# Patient Record
Sex: Female | Born: 1935 | Race: Black or African American | Hispanic: No | Smoking: Former smoker
Health system: Southern US, Community
[De-identification: ages and names within clinical notes are randomized; demographics above are authoritative.]

## PROBLEM LIST (undated history)

## (undated) DIAGNOSIS — M810 Age-related osteoporosis without current pathological fracture: Secondary | ICD-10-CM

## (undated) DIAGNOSIS — H409 Unspecified glaucoma: Secondary | ICD-10-CM

## (undated) DIAGNOSIS — Z950 Presence of cardiac pacemaker: Secondary | ICD-10-CM

## (undated) DIAGNOSIS — C801 Malignant (primary) neoplasm, unspecified: Secondary | ICD-10-CM

## (undated) DIAGNOSIS — E119 Type 2 diabetes mellitus without complications: Secondary | ICD-10-CM

## (undated) DIAGNOSIS — C50919 Malignant neoplasm of unspecified site of unspecified female breast: Secondary | ICD-10-CM

## (undated) DIAGNOSIS — I639 Cerebral infarction, unspecified: Secondary | ICD-10-CM

## (undated) DIAGNOSIS — I10 Essential (primary) hypertension: Secondary | ICD-10-CM

## (undated) DIAGNOSIS — E785 Hyperlipidemia, unspecified: Secondary | ICD-10-CM

## (undated) DIAGNOSIS — Z923 Personal history of irradiation: Secondary | ICD-10-CM

## (undated) HISTORY — PX: OTHER SURGICAL HISTORY: SHX169

## (undated) HISTORY — DX: Malignant (primary) neoplasm, unspecified: C80.1

## (undated) HISTORY — DX: Essential (primary) hypertension: I10

## (undated) HISTORY — DX: Unspecified glaucoma: H40.9

## (undated) HISTORY — DX: Presence of cardiac pacemaker: Z95.0

## (undated) HISTORY — DX: Type 2 diabetes mellitus without complications: E11.9

## (undated) HISTORY — DX: Age-related osteoporosis without current pathological fracture: M81.0

## (undated) HISTORY — DX: Hyperlipidemia, unspecified: E78.5

---

## 1998-12-25 ENCOUNTER — Other Ambulatory Visit: Admission: RE | Admit: 1998-12-25 | Discharge: 1998-12-25 | Payer: Self-pay | Admitting: Family Medicine

## 2001-03-11 ENCOUNTER — Encounter (INDEPENDENT_AMBULATORY_CARE_PROVIDER_SITE_OTHER): Payer: Self-pay | Admitting: *Deleted

## 2001-03-11 ENCOUNTER — Encounter: Payer: Self-pay | Admitting: General Surgery

## 2001-03-11 ENCOUNTER — Encounter: Admission: RE | Admit: 2001-03-11 | Discharge: 2001-03-11 | Payer: Self-pay | Admitting: General Surgery

## 2001-03-11 HISTORY — PX: BREAST BIOPSY: SHX20

## 2001-03-12 ENCOUNTER — Other Ambulatory Visit: Admission: RE | Admit: 2001-03-12 | Discharge: 2001-03-12 | Payer: Self-pay | Admitting: Radiology

## 2001-03-25 ENCOUNTER — Encounter: Admission: RE | Admit: 2001-03-25 | Discharge: 2001-03-25 | Payer: Self-pay | Admitting: General Surgery

## 2001-03-25 ENCOUNTER — Encounter: Payer: Self-pay | Admitting: General Surgery

## 2001-03-25 ENCOUNTER — Encounter (INDEPENDENT_AMBULATORY_CARE_PROVIDER_SITE_OTHER): Payer: Self-pay | Admitting: *Deleted

## 2001-03-25 ENCOUNTER — Ambulatory Visit (HOSPITAL_BASED_OUTPATIENT_CLINIC_OR_DEPARTMENT_OTHER): Admission: RE | Admit: 2001-03-25 | Discharge: 2001-03-25 | Payer: Self-pay | Admitting: General Surgery

## 2001-03-25 HISTORY — PX: BREAST LUMPECTOMY: SHX2

## 2001-04-13 ENCOUNTER — Ambulatory Visit: Admission: RE | Admit: 2001-04-13 | Discharge: 2001-07-12 | Payer: Self-pay | Admitting: Radiation Oncology

## 2001-04-24 DIAGNOSIS — Z923 Personal history of irradiation: Secondary | ICD-10-CM

## 2001-04-24 HISTORY — DX: Personal history of irradiation: Z92.3

## 2001-12-09 ENCOUNTER — Encounter: Admission: RE | Admit: 2001-12-09 | Discharge: 2001-12-09 | Payer: Self-pay | Admitting: General Surgery

## 2001-12-09 ENCOUNTER — Encounter: Payer: Self-pay | Admitting: General Surgery

## 2002-01-29 ENCOUNTER — Emergency Department (HOSPITAL_COMMUNITY): Admission: EM | Admit: 2002-01-29 | Discharge: 2002-01-29 | Payer: Self-pay | Admitting: *Deleted

## 2002-01-29 ENCOUNTER — Encounter: Payer: Self-pay | Admitting: *Deleted

## 2002-03-14 ENCOUNTER — Other Ambulatory Visit: Admission: RE | Admit: 2002-03-14 | Discharge: 2002-03-14 | Payer: Self-pay | Admitting: Family Medicine

## 2002-12-22 ENCOUNTER — Encounter: Payer: Self-pay | Admitting: General Surgery

## 2002-12-22 ENCOUNTER — Encounter: Admission: RE | Admit: 2002-12-22 | Discharge: 2002-12-22 | Payer: Self-pay | Admitting: General Surgery

## 2003-12-28 ENCOUNTER — Encounter: Admission: RE | Admit: 2003-12-28 | Discharge: 2003-12-28 | Payer: Self-pay | Admitting: General Surgery

## 2004-09-15 ENCOUNTER — Other Ambulatory Visit: Admission: RE | Admit: 2004-09-15 | Discharge: 2004-09-15 | Payer: Self-pay | Admitting: Family Medicine

## 2004-09-18 ENCOUNTER — Ambulatory Visit: Payer: Self-pay | Admitting: Cardiology

## 2004-09-18 ENCOUNTER — Inpatient Hospital Stay (HOSPITAL_COMMUNITY): Admission: AD | Admit: 2004-09-18 | Discharge: 2004-09-22 | Payer: Self-pay | Admitting: Cardiology

## 2004-09-19 ENCOUNTER — Encounter: Payer: Self-pay | Admitting: Cardiology

## 2004-09-24 ENCOUNTER — Ambulatory Visit: Payer: Self-pay | Admitting: Cardiology

## 2005-01-19 ENCOUNTER — Ambulatory Visit: Payer: Self-pay | Admitting: Cardiology

## 2005-01-28 ENCOUNTER — Ambulatory Visit: Payer: Self-pay | Admitting: Cardiology

## 2005-02-06 ENCOUNTER — Encounter: Admission: RE | Admit: 2005-02-06 | Discharge: 2005-02-06 | Payer: Self-pay | Admitting: General Surgery

## 2005-09-24 ENCOUNTER — Ambulatory Visit: Payer: Self-pay | Admitting: Pulmonary Disease

## 2005-11-16 ENCOUNTER — Ambulatory Visit: Payer: Self-pay | Admitting: Pulmonary Disease

## 2006-05-26 ENCOUNTER — Ambulatory Visit (HOSPITAL_COMMUNITY): Admission: RE | Admit: 2006-05-26 | Discharge: 2006-05-26 | Payer: Self-pay | Admitting: General Surgery

## 2007-02-09 ENCOUNTER — Ambulatory Visit: Payer: Self-pay | Admitting: Cardiology

## 2007-04-08 HISTORY — PX: PACEMAKER INSERTION: SHX728

## 2007-06-02 ENCOUNTER — Encounter: Admission: RE | Admit: 2007-06-02 | Discharge: 2007-06-02 | Payer: Self-pay | Admitting: Surgery

## 2008-07-31 ENCOUNTER — Encounter: Admission: RE | Admit: 2008-07-31 | Discharge: 2008-07-31 | Payer: Self-pay | Admitting: Surgery

## 2010-02-27 ENCOUNTER — Ambulatory Visit (HOSPITAL_COMMUNITY): Admission: RE | Admit: 2010-02-27 | Discharge: 2010-02-27 | Payer: Self-pay | Admitting: Ophthalmology

## 2010-04-24 ENCOUNTER — Encounter: Admission: RE | Admit: 2010-04-24 | Discharge: 2010-04-24 | Payer: Self-pay | Admitting: Family Medicine

## 2010-06-15 ENCOUNTER — Encounter: Payer: Self-pay | Admitting: Surgery

## 2010-06-15 ENCOUNTER — Encounter: Payer: Self-pay | Admitting: General Surgery

## 2010-08-07 LAB — HEMOGLOBIN AND HEMATOCRIT, BLOOD: HCT: 36 % (ref 36.0–46.0)

## 2010-08-07 LAB — BASIC METABOLIC PANEL
CO2: 28 mEq/L (ref 19–32)
Calcium: 8.8 mg/dL (ref 8.4–10.5)
Creatinine, Ser: 0.77 mg/dL (ref 0.4–1.2)
GFR calc Af Amer: >60 mL/min (ref >60)
GFR calc non Af Amer: >60 mL/min (ref >60)

## 2010-10-10 NOTE — Cardiovascular Report (Signed)
NAMECHRISTA, Henson NO.:  [id number]   MEDICAL RECORD NO.:  [id number]          PATIENT TYPE:  INP   LOCATION:  2036                         FACILITY:  [facility]   PHYSICIAN:  Arturo Morton. Riley Kill, M.D. [facility] OF BIRTH:  1923/05/01   DATE OF PROCEDURE:  09/19/2004  DATE OF DISCHARGE:                              CARDIAC CATHETERIZATION   INDICATIONS:  Kathryn Henson is a 77 year old woman who presents with a markedly  abnormal EKG which is changed from previous studies.  There are deep T-wave  inversions noted in the inferior and anterolateral leads.  This is changed  from a previous electrocardiogram.  She was seen by Dr. Diona Browner and  subsequently referred for catheterization.  Her chief symptom complex was  progressive shortness of breath.  Left heart catheterization was recommended  by Dr. Diona Browner, and the patient set up for this procedure.   PROCEDURES:  1.  Left heart catheterization.  2.  Selective coronary arteriography.  3.  Selective left ventriculography.   DESCRIPTION OF PROCEDURE:  The patient was brought to the catheterization  laboratory and prepped and draped in the usual fashion.  Through an anterior  puncture, the right femoral artery was easily entered, a 6 French sheath was  placed.  Views of the left and right coronary arteries were obtained in  multiple angiographic projections.  Central aortic and left ventricular  pressures were measured with pigtail.  Ventriculography was performed in the  RAO projection.  Overall, the patient tolerated the procedure well.  All  catheters were subsequently removed and she was removed to the holding area  for direct manual compression.  I then spoke with Dr. Diona Browner by phone.  The patient's  husband was not here at the time.   HEMODYNAMIC DATA:  1.  Central aortic pressure 143/77, mean 102.  2.  Left ventricular pressure 141/16.  3.  No gradient on pullback across the aortic valve.   ANGIOGRAPHIC DATA:  1.  Ventriculography was performed in the RAO projection.  LV function was      vigorous.  There was trace mitral regurgitation.  Ejection fraction was      calculated at 63%.  No definite wall motion abnormalities were seen.      There was a suggestion of a left ventricular hypertrophy.  2.  There was mild scattered calcification of the coronary arteries.  3.  The left main coronary is large and free of critical disease.  4.  The left anterior descending artery courses to the apex.  There is      fairly marked tortuosity throughout.  There is 20% segmental plaquing in      the proximal vessel.  There is probably 30% narrowing in the mid vessel.      The distal vessel wraps the apex and is markedly tortuous.  There is a      small caliber diagonal branch which has some diffuse luminal      irregularity, but not critical stenoses.  5.  There is 20% proximal narrowing in the circumflex and some      calcification.  There  is so much tortuosity in the circumflex system      that it would be unlikely that this could be approached percutaneously.      There is a first marginal branch which bifurcates and becomes a small      caliber vessel distally.  The AV circumflex provides an additional two      marginal branches.  They are free of critical disease.  6.  The right coronary artery has about 20% proximal narrowing and then      supplies some fairly small posterior descending and an acute marginal      branch; both of which are free of critical disease.   CONCLUSIONS:  1.  Well-preserved overall left ventricular function.  2.  No critical narrowing of the coronary arteries.  3.  Markedly abnormal electrocardiogram of uncertain etiology.   PLAN:  1.  We will check a D-dimer.  2.  We will add nitrates and beta blockade to the current regimen.  3.  We will consider a CT scan and MRI.  4.  She will ambulate over the weekend and we will watch her EKG during that      period of time to see if  there is some latent improvement.   DISCUSSION:  The ECG at parts suggests a Takotsubo type of presentation.  However, her symptoms have been gradual over time.  The current study does  not demonstrate a clear-cut entity.  We will further evaluate her carefully.      TDS/MEDQ  D:  09/19/2004  T:  09/19/2004  Job:  [id number]   cc:   Ernestina Penna, M.D.  [address] [place]  [place] [address]  Fax: [phone]

## 2010-10-10 NOTE — Op Note (Signed)
[place]  Patient:    Kathryn Henson, Kathryn Henson Visit Number: [id number] MRN: [id number]          Service Type: DSU Location: [facility] Attending Physician:  Janalyn Rouse Dictated by:   Rose Phi. Maple Hudson, M.D. Proc. Date: 03/25/01 Admit Date:  03/25/2001 Discharge Date: 03/25/2001                             Operative Report  PREOPERATIVE DIAGNOSIS:  Carcinoma of the left breast.  POSTOPERATIVE DIAGNOSIS:  Carcinoma of the left breast.  OPERATION PERFORMED: 1. Blue dye injection. 2. Left partial mastectomy. 3. Left axillary sentinel lymph node biopsy.  SURGEON:  Rose Phi. Maple Hudson, M.D.  ANESTHESIA:  General.  DESCRIPTION OF PROCEDURE:  Prior to coming to the operating room the patient had two wires placed to identify both lesions that were in the upper outer quadrant of her left breast.  She also had 1 mC1 of technetium sulfur colloid injected intradermally around the areolar tissue.  After she was put to sleep, I injected 5 cc of Lymphazurin blue in the subareolar tissue.  After suitable general anesthesia was induced, the patient was placed in the supine position with the left arm extended on the arm board and the left breast and chest wall prepped and draped in the usual fashion.  With the two wires placed for the two lesions that were in the upper outer quadrant, I centered a curved incision between the wires and designed where the Xs marked the actual locations of the lesion.  The incision was then made and we delivered both wires into the incision and then excised the tissue surrounding the wires.  Specimen mammography confirmed the removal of the lesions and they were then submitted to the pathologist.  While that was being done, a short transverse left axillary incision was made with dissection through the subcutaneous tissues and a self-retaining retractor inserted.  Just deep to the clavipectoral fascia was one mildly blue and hot  lymph node and another blue and hot lymph node, both of which were removed as sentinel nodes and submitted to the pathologist.  The two nodes turned out to be negative.  The incision was closed with 3-0 Vicryl and subcuticular 4-0 Monocryl and Steri-Strips.  As far as the partial mastectomy was concerned, I had taken about as much tissue as I could without just completely compromising the cosmetic effect and so I awaited the margins on permanents. That wound was closed with 5-0 Monocryl and Steri-Strips also.  Dressing was then applied.  The patient was transferred to the recovery room in satisfactory condition having tolerated the procedure well. Dictated by:   Rose Phi. Maple Hudson, M.D. Attending Physician:  Janalyn Rouse DD:  03/25/01 TD:  03/28/01 Job: [id number]

## 2010-10-10 NOTE — Discharge Summary (Signed)
Kathryn Henson, BRICKLEY NO.:  [id number]   MEDICAL RECORD NO.:  [id number]          PATIENT TYPE:  INP   LOCATION:  2036                         FACILITY:  [facility]   PHYSICIAN:  Jonelle Sidle, M.D. LHCDATE OF BIRTH:  1922/12/11   DATE OF ADMISSION:  09/18/2004  DATE OF DISCHARGE:  09/22/2004                                 DISCHARGE SUMMARY   PROCEDURE:  1.  Cardiac catheterization September 18, 2004.  2.  CT angiogram September 20, 2004.  3.  Head/neck MRI/MRA September 20, 2004.   REASON FOR ADMISSION:  [name] is a 77 year old female with no prior  history of coronary artery disease who presented to Dr. Simona Huh in the  office for evaluation of recent progressive dyspnea and an abnormal resting  electrocardiogram.   LABORATORY DATA:  Cardiac enzymes normal.  BNP 151. Lipid profile with total  cholesterol 166, triglyceride 151, HDL 49, LDL 87, TSH 2.2.  Hemoglobin  11.5, hematocrit 34, MCV 92, platelets 168, WBC 4.4 on admission.  D-dimer  0.87.  Normal electrolytes, renal function, and liver enzymes.   HOSPITAL COURSE:  Following direct admission by Dr. Simona Huh from his  office, the patient was placed on a medication regimen including aspirin and  Lovenox.  Serial markers were obtained and were all within normal limits.  Initial echocardiogram revealed normal LVF (EF 55-65%) with mild aortic  regurgitation/mitral regurgitation and moderate tricuspid regurgitation.  The patient was referred for cardiac catheterization performed by Dr. Bonnee Quin (see report for full details) revealing nonobstructive coronary  artery disease with vigorous left ventricular  function (EF 50-60%) no wall  motion abnormalities, and trace mitral regurgitation.  Dr. Riley Kill then  ordered a D-dimer which was elevated (0.87) however, follow up CT scan of  the chest was negative for pulmonary emboli.  There was, however, a 4 mm LUL  nodule, most likely representing a noncalcified  granuloma.  This will need  to be followed up with CT scan in three months.  Dr. Andee Lineman then wondered  whether or not the abnormal resting electrocardiogram with marked symmetric  T-wave inversion could represent a CNS event.  The patient was referred for  an MRI of the head/neck to rule out a CNS event.  This revealed no  circulatory abnormalities with evidence of a 2-3 mm right (PCA) aneurysm of  fetal-type etiology.   On the day of discharge, the patient underwent repeat 2D echocardiogram,  this time with Valsalva maneuver (results pending at discharge).  The  patient was subsequently cleared by Dr. Bonnee Quin with recommendation for  early follow up with Dr. Simona Huh in [place].  He also noted the patient  will need a follow up pulmonary evaluation as well as repeat chest CT scan  as recommended.  He recommended consideration for further evaluation of the  noted posterior cerebral artery aneurysm.   DISCHARGE MEDICATIONS:  Metoprolol 25 mg b.i.d., (new), Imdur 15 mg daily  (new), Amaryl 2 mg daily, aspirin 81 mg daily, Zocor 10 mg daily, Mavik 1 mg  daily.   DISCHARGE INSTRUCTIONS:  1.  Discontinue Inderal.  2.  No heavy lifting/driving.  Arrangements have been made for the patient to follow up with Dr. Simona Huh on Wednesday, May 3, at 1:15 p.m., at our eating clinic.   DISCHARGE DIAGNOSIS:  1.  Dyspnea/abnormal electrocardiogram.      1.  Undetermined etiology.      2.  Nonobstructive coronary artery disease/normal left ventricular          function/cardiac catheterization September 18, 2004.      3.  Normal cardiac markers.  2.  Interstitial lung disease.      1.  Left upper lobe nodule.  3.  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  4.  Hypertension.  5.  Diabetes mellitus.  6.  Dyslipidemia.      GS/MEDQ  D:  09/22/2004  T:  09/22/2004  Job:  [id number]   cc:   Magnus Sinning. Dimple Casey, M.D.  [address] [place]  [place] [address]  Fax: [phone]

## 2010-10-10 NOTE — H&P (Signed)
[name], [name] NO.:  [id number]   MEDICAL RECORD NO.:  [id number]          PATIENT TYPE:  INP   LOCATION:  2036                         FACILITY:  [facility]   PHYSICIAN:  Jonelle Sidle, M.D. LHCDATE OF BIRTH:  Sep 04, 1922   DATE OF ADMISSION:  09/18/2004  DATE OF DISCHARGE:                                HISTORY & PHYSICAL   PRIMARY CARE PHYSICIAN:  Dr. Montey Hora.   CARDIOLOGIST:  Nona Dell, M.D.   REASON FOR ADMISSION:  Progressive shortness of breath and abnormal resting  electrocardiogram.   HISTORY OF PRESENT ILLNESS:  Kathryn Henson is a very pleasant 77 year old woman  with a 10-year history of both types of  diabetes mellitus and dyslipidemia  as well as hypertension diagnosed approximately one year ago.  She has no  previously documented history of cardiovascular disease.  She recently saw  Dr. Dimple Casey for routine visit and mentioned that she had been having slowly  progressive shortness of breath with activity.  She had an electrocardiogram  obtained in the office which was significantly abnormal, showing deep T-wave  inversions in the lateral leads, with moderate T-wave inversions in the  inferior leads.  This was new compared to a previous tracing from November  2004.  She was subsequently referred to see [place] and discuss things further.   [name] denies having any significant chest discomfort and stated that  largely she has been stable although has noted over the last 1-2 years she  has been progressively more short of breath with activity, as well as more  fatigued.  She can walk approximately 1/4 of a mile, although has to stop  due to fatigue and shortness of breath.  She has noted no prolonged or  bothersome palpitations, although does experience a rapid heart beat for a  few seconds approximately once a  month.  She apparently has a prior history  of  irregular heart beat and has been on Inderal long term for this.  She  denies  any focal neurological symptoms or changes in mental status.  She has  been compliant with her medications and otherwise states that she has been  in her usual state of health.  Repeat electrocardiogram today shows marked T-  wave abnormalities with deep T-wave inversions in the inferior and  anterolateral leads with 0.5 mm ST segment depression also noted.  This  looks to be somewhat progressed compared to tracing from the 24th of April.  Presently in the office she is denying any active shortness of breath or  chest pain at rest and looks comfortable.   ALLERGIES:  No known drug allergies.   CURRENT MEDICATIONS:  1.  Amaryl 2 mg p.o. daily.  2.  Enteric-coated aspirin 81 mg p.o. daily.  3.  Ibuprofen 200 mg p.o. daily p.r.n.  4.  Inderal 10 mg p.o. daily.  5.  Mavik 1 mg p.o. daily.  6.  Zocor 10 mg p.o. daily.   PAST MEDICAL HISTORY:  1.  Hypertension.  2.  Type 2 diabetes mellitus.  3.  Dyslipidemia.  4.  History of arthritis.  5.  Status post left breast lumpectomy in 2002.   FAMILY HISTORY:  Significant for heart problems in the patient's mother  who died at age 59.  Has a history of stroke in the patient's father who  died at age 37.  She also had 4 sisters ages 107, 86, 77 and 17 who died with  cancer, stroke, heart disease and old age respectively.   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  There is no significant tobacco or alcohol use history.   REVIEW OF SYSTEMS:  As per history of present illness.  She has occasional  problems with seasonal allergies.  She is bothered by arthritic pain  occasionally.  She has otherwise had no major complaints and systems are  otherwise negative.   PHYSICAL EXAMINATION:  Blood pressure is 152/86, heart rate is 76, weighs  145 pounds.  GENERAL:  In general, this is a well-nourished woman, seated, in no  acute  distress, wearing glasses.  HEENT:  Conjunctivae normal, pharynx is clear, neck is supple without  elevated jugular venous pressure or loud carotid bruits.  No thyromegaly is  noted.  LUNGS:  Clear with unlabored breathing at rest.  CARDIAC EXAM:  Reveals a regular rate and rhythm with a 2/6 systolic murmur  heard best at the base without radiation to the carotid.  Second heart sound  is preserved.  No pericardial rub or S3 gallop is noted.  PMI is indistinct.  ABDOMEN:  Soft and nontender with no hepatomegaly or rebound.  Bowel sounds  normal.  EXTREMITIES:  No pitting edema.  Peripheral pulses are 2+.  SKIN:  No ulcerative changes are noted.  MUSCULOSKELETAL:  No kyphosis is noted.  NEUROPSYCHIATRIC:  The patient is alert and oriented x3.  Affect is normal.   IMPRESSION AND RECOMMENDATIONS:  1.  Progressive shortness of breath and fatigue with activity, with      significantly abnormal resting electrocardiogram showing marked T-wave      abnormalities fairly diffusely.  This almost has the pattern of a CNS      event although nothing is apparent based on review of systems or      symptoms at this time.  Obviously concerns would be ischemic heart      disease and/or subendocardial event.  At this point we will admit Ms.      Dow to the hospital, obtain baseline blood work, chest x-ray and      anticipate diagnostic coronary angiography and to clearly assess      coronary anatomy.  We will also plan a 2D echocardiogram to follow up on      cardiac murmur, structure and function.  2.  Plan to continue present medications with the addition of Lovenox.  We      will also follow up with fasting profile and check cardiac markers.  3.  Further plans to follow.  Risks and benefits were discussed with the      patient and she agrees with plan as outlined.      SGM/MEDQ  D:  09/18/2004  T:  09/18/2004  Job:  [id number]

## 2011-04-24 ENCOUNTER — Other Ambulatory Visit: Payer: Self-pay | Admitting: Family Medicine

## 2011-04-24 DIAGNOSIS — Z1231 Encounter for screening mammogram for malignant neoplasm of breast: Secondary | ICD-10-CM

## 2011-05-20 ENCOUNTER — Ambulatory Visit
Admission: RE | Admit: 2011-05-20 | Discharge: 2011-05-20 | Disposition: A | Discharge status: Home / Self Care | Payer: Medicare Other | Source: Ambulatory Visit | Attending: Family Medicine | Admitting: Family Medicine

## 2011-05-20 DIAGNOSIS — Z1231 Encounter for screening mammogram for malignant neoplasm of breast: Secondary | ICD-10-CM

## 2011-05-21 ENCOUNTER — Other Ambulatory Visit: Payer: Self-pay | Admitting: Obstetrics & Gynecology

## 2011-05-21 ENCOUNTER — Other Ambulatory Visit: Payer: Self-pay | Admitting: Family Medicine

## 2011-05-21 DIAGNOSIS — N63 Unspecified lump in unspecified breast: Secondary | ICD-10-CM

## 2011-06-11 ENCOUNTER — Ambulatory Visit
Admission: RE | Admit: 2011-06-11 | Discharge: 2011-06-11 | Disposition: A | Discharge status: Home / Self Care | Payer: Medicare Other | Source: Ambulatory Visit | Attending: Family Medicine | Admitting: Family Medicine

## 2011-06-11 ENCOUNTER — Other Ambulatory Visit: Payer: Self-pay | Admitting: Family Medicine

## 2011-06-11 DIAGNOSIS — N63 Unspecified lump in unspecified breast: Secondary | ICD-10-CM

## 2011-11-17 DIAGNOSIS — I5181 Takotsubo syndrome: Secondary | ICD-10-CM | POA: Insufficient documentation

## 2012-05-13 ENCOUNTER — Other Ambulatory Visit: Payer: Self-pay | Admitting: Family Medicine

## 2012-05-13 DIAGNOSIS — Z853 Personal history of malignant neoplasm of breast: Secondary | ICD-10-CM

## 2012-05-13 DIAGNOSIS — Z1231 Encounter for screening mammogram for malignant neoplasm of breast: Secondary | ICD-10-CM

## 2012-05-13 DIAGNOSIS — Z9889 Other specified postprocedural states: Secondary | ICD-10-CM

## 2012-06-20 ENCOUNTER — Ambulatory Visit
Admission: RE | Admit: 2012-06-20 | Discharge: 2012-06-20 | Disposition: A | Discharge status: Home / Self Care | Payer: Medicare Other | Source: Ambulatory Visit | Attending: Family Medicine | Admitting: Family Medicine

## 2012-06-20 DIAGNOSIS — Z1231 Encounter for screening mammogram for malignant neoplasm of breast: Secondary | ICD-10-CM

## 2012-06-20 DIAGNOSIS — Z853 Personal history of malignant neoplasm of breast: Secondary | ICD-10-CM

## 2012-06-20 DIAGNOSIS — Z9889 Other specified postprocedural states: Secondary | ICD-10-CM

## 2012-09-10 DIAGNOSIS — R55 Syncope and collapse: Secondary | ICD-10-CM | POA: Insufficient documentation

## 2012-09-20 ENCOUNTER — Ambulatory Visit (INDEPENDENT_AMBULATORY_CARE_PROVIDER_SITE_OTHER): Payer: Medicare Other | Admitting: General Practice

## 2012-09-20 ENCOUNTER — Encounter: Payer: Self-pay | Admitting: General Practice

## 2012-09-20 VITALS — BP 139/82 | HR 77 | Temp 97.2°F | Ht 60.0 in | Wt 127.0 lb

## 2012-09-20 DIAGNOSIS — R55 Syncope and collapse: Secondary | ICD-10-CM

## 2012-09-20 DIAGNOSIS — Z09 Encounter for follow-up examination after completed treatment for conditions other than malignant neoplasm: Secondary | ICD-10-CM

## 2012-09-20 NOTE — Progress Notes (Signed)
  Subjective:    Patient ID: Kathryn Henson, female    DOB: [birth year], 79 y.o.   MRN: [id number]  HPI Patient presents today for follow up of chronic health conditions and recent hospitalization. Hospitalized overnight for syncope episode. Reports no further episodes of dizziness or syncope.  Hypertension- checks blood pressure at home occasionally (120's/ 70-80's). Hyperglycemia- blood sugar checked daily ( 120's normally). Denies hypoglycemia episodes. Reports eating a healthy diet and exercise regularly. Denies muscle cramps in legs.     Review of Systems  Constitutional: Negative for activity change and appetite change.  HENT: Negative for neck pain.   Respiratory: Negative for chest tightness and shortness of breath.   Cardiovascular: Negative for chest pain and palpitations.  Gastrointestinal: Negative for abdominal pain and abdominal distention.  Genitourinary: Negative for difficulty urinating and dyspareunia.  Musculoskeletal: Negative for back pain.  Skin: Negative.   Neurological: Negative for dizziness, numbness and headaches.  Psychiatric/Behavioral: Negative.        Objective:   Physical Exam  Constitutional: She is oriented to person, place, and time. She appears well-developed and well-nourished.  HENT:  Head: Normocephalic and atraumatic.  Cardiovascular: Normal rate, regular rhythm and normal heart sounds.   Pulmonary/Chest: Effort normal. No respiratory distress. She exhibits no tenderness.  Abdominal: Soft. She exhibits no distension and no mass. There is no tenderness. There is no rebound and no guarding.  Musculoskeletal: Normal range of motion.  Positive monofilament test times 4 areas of bilateral feet  Neurological: She is alert and oriented to person, place, and time. No cranial nerve deficit.  Skin: Skin is warm and dry.  Psychiatric: She has a normal mood and affect.          Assessment & Plan:  Continue current medications Continue current  exercise routine and healthy diet RTO if symptoms develop Patient verbalized understanding Raymon Mutton, FNP-C

## 2012-09-20 NOTE — Patient Instructions (Signed)
Hypertension As your heart beats, it forces blood through your arteries. This force is your blood pressure. If the pressure is too high, it is called hypertension (HTN) or high blood pressure. HTN is dangerous because you may have it and not know it. High blood pressure may mean that your heart has to work harder to pump blood. Your arteries may be narrow or stiff. The extra work puts you at risk for heart disease, stroke, and other problems.  Blood pressure consists of two numbers, a higher number over a lower, 110/72, for example. It is stated as "110 over 72." The ideal is below 120 for the top number (systolic) and under 80 for the bottom (diastolic). Write down your blood pressure today. You should pay close attention to your blood pressure if you have certain conditions such as:  Heart failure.  Prior heart attack.  Diabetes  Chronic kidney disease.  Prior stroke.  Multiple risk factors for heart disease. To see if you have HTN, your blood pressure should be measured while you are seated with your arm held at the level of the heart. It should be measured at least twice. A one-time elevated blood pressure reading (especially in the Emergency Department) does not mean that you need treatment. There may be conditions in which the blood pressure is different between your right and left arms. It is important to see your caregiver soon for a recheck. Most people have essential hypertension which means that there is not a specific cause. This type of high blood pressure may be lowered by changing lifestyle factors such as:  Stress.  Smoking.  Lack of exercise.  Excessive weight.  Drug/tobacco/alcohol use.  Eating less salt. Most people do not have symptoms from high blood pressure until it has caused damage to the body. Effective treatment can often prevent, delay or reduce that damage. TREATMENT  When a cause has been identified, treatment for high blood pressure is directed at the  cause. There are a large number of medications to treat HTN. These fall into several categories, and your caregiver will help you select the medicines that are best for you. Medications may have side effects. You should review side effects with your caregiver. If your blood pressure stays high after you have made lifestyle changes or started on medicines,   Your medication(s) may need to be changed.  Other problems may need to be addressed.  Be certain you understand your prescriptions, and know how and when to take your medicine.  Be sure to follow up with your caregiver within the time frame advised (usually within two weeks) to have your blood pressure rechecked and to review your medications.  If you are taking more than one medicine to lower your blood pressure, make sure you know how and at what times they should be taken. Taking two medicines at the same time can result in blood pressure that is too low. SEEK IMMEDIATE MEDICAL CARE IF:  You develop a severe headache, blurred or changing vision, or confusion.  You have unusual weakness or numbness, or a faint feeling.  You have severe chest or abdominal pain, vomiting, or breathing problems. MAKE SURE YOU:   Understand these instructions.  Will watch your condition.  Will get help right away if you are not doing well or get worse. Document Released: 05/11/2005 Document Revised: 08/03/2011 Document Reviewed: 12/30/2007 [employer] Patient Information 2013 [employer].  Hypertriglyceridemia  Diet for High blood levels of Triglycerides Most fats in food are triglycerides.   Triglycerides in your blood are stored as fat in your body. High levels of triglycerides in your blood may put you at a greater risk for heart disease and stroke.  Normal triglyceride levels are less than 150 mg/dL. Borderline high levels are 150-199 mg/dl. High levels are 200 - 499 mg/dL, and very high triglyceride levels are greater than 500 mg/dL. The decision  to treat high triglycerides is generally based on the level. For people with borderline or high triglyceride levels, treatment includes weight loss and exercise. Drugs are recommended for people with very high triglyceride levels. Many people who need treatment for high triglyceride levels have metabolic syndrome. This syndrome is a collection of disorders that often include: insulin resistance, high blood pressure, blood clotting problems, high cholesterol and triglycerides. TESTING PROCEDURE FOR TRIGLYCERIDES  You should not eat 4 hours before getting your triglycerides measured. The normal range of triglycerides is between 10 and 250 milligrams per deciliter (mg/dl). Some people may have extreme levels (1000 or above), but your triglyceride level may be too high if it is above 150 mg/dl, depending on what other risk factors you have for heart disease.  People with high blood triglycerides may also have high blood cholesterol levels. If you have high blood cholesterol as well as high blood triglycerides, your risk for heart disease is probably greater than if you only had high triglycerides. High blood cholesterol is one of the main risk factors for heart disease. CHANGING YOUR DIET  Your weight can affect your blood triglyceride level. If you are more than 20% above your ideal body weight, you may be able to lower your blood triglycerides by losing weight. Eating less and exercising regularly is the best way to combat this. Fat provides more calories than any other food. The best way to lose weight is to eat less fat. Only 30% of your total calories should come from fat. Less than 7% of your diet should come from saturated fat. A diet low in fat and saturated fat is the same as a diet to decrease blood cholesterol. By eating a diet lower in fat, you may lose weight, lower your blood cholesterol, and lower your blood triglyceride level.  Eating a diet low in fat, especially saturated fat, may also help  you lower your blood triglyceride level. Ask your dietitian to help you figure how much fat you can eat based on the number of calories your caregiver has prescribed for you.  Exercise, in addition to helping with weight loss may also help lower triglyceride levels.   Alcohol can increase blood triglycerides. You may need to stop drinking alcoholic beverages.  Too much carbohydrate in your diet may also increase your blood triglycerides. Some complex carbohydrates are necessary in your diet. These may include bread, rice, potatoes, other starchy vegetables and cereals.  Reduce "simple" carbohydrates. These may include pure sugars, candy, honey, and jelly without losing other nutrients. If you have the kind of high blood triglycerides that is affected by the amount of carbohydrates in your diet, you will need to eat less sugar and less high-sugar foods. Your caregiver can help you with this.  Adding 2-4 grams of fish oil (EPA+ DHA) may also help lower triglycerides. Speak with your caregiver before adding any supplements to your regimen. Following the Diet  Maintain your ideal weight. Your caregivers can help you with a diet. Generally, eating less food and getting more exercise will help you lose weight. Joining a weight control group may also help.   Ask your caregivers for a good weight control group in your area.  Eat low-fat foods instead of high-fat foods. This can help you lose weight too.  These foods are lower in fat. Eat MORE of these:   Dried beans, peas, and lentils.  Egg whites.  Low-fat cottage cheese.  Fish.  Lean cuts of meat, such as round, sirloin, rump, and flank (cut extra fat off meat you fix).  Whole grain breads, cereals and pasta.  Skim and nonfat dry milk.  Low-fat yogurt.  Poultry without the skin.  Cheese made with skim or part-skim milk, such as mozzarella, parmesan, farmers', ricotta, or pot cheese. These are higher fat foods. Eat LESS of these:   Whole  milk and foods made from whole milk, such as American, blue, cheddar, monterey jack, and swiss cheese  High-fat meats, such as luncheon meats, sausages, knockwurst, bratwurst, hot dogs, ribs, corned beef, ground pork, and regular ground beef.  Fried foods. Limit saturated fats in your diet. Substituting unsaturated fat for saturated fat may decrease your blood triglyceride level. You will need to read package labels to know which products contain saturated fats.  These foods are high in saturated fat. Eat LESS of these:   Fried pork skins.  Whole milk.  Skin and fat from poultry.  Palm oil.  Butter.  Shortening.  Cream cheese.  Bacon.  Margarines and baked goods made from listed oils.  Vegetable shortenings.  Chitterlings.  Fat from meats.  Coconut oil.  Palm kernel oil.  Lard.  Cream.  Sour cream.  Fatback.  Coffee whiteners and non-dairy creamers made with these oils.  Cheese made from whole milk. Use unsaturated fats (both polyunsaturated and monounsaturated) moderately. Remember, even though unsaturated fats are better than saturated fats; you still want a diet low in total fat.  These foods are high in unsaturated fat:   Canola oil.  Sunflower oil.  Mayonnaise.  Almonds.  Peanuts.  Pine nuts.  Margarines made with these oils.  Safflower oil.  Olive oil.  Avocados.  Cashews.  Peanut butter.  Sunflower seeds.  Soybean oil.  Peanut oil.  Olives.  Pecans.  Walnuts.  Pumpkin seeds. Avoid sugar and other high-sugar foods. This will decrease carbohydrates without decreasing other nutrients. Sugar in your food goes rapidly to your blood. When there is excess sugar in your blood, your liver may use it to make more triglycerides. Sugar also contains calories without other important nutrients.  Eat LESS of these:   Sugar, brown sugar, powdered sugar, jam, jelly, preserves, honey, syrup, molasses, pies, candy, cakes, cookies,  frosting, pastries, colas, soft drinks, punches, fruit drinks, and regular gelatin.  Avoid alcohol. Alcohol, even more than sugar, may increase blood triglycerides. In addition, alcohol is high in calories and low in nutrients. Ask for sparkling water, or a diet soft drink instead of an alcoholic beverage. Suggestions for planning and preparing meals   Bake, broil, grill or roast meats instead of frying.  Remove fat from meats and skin from poultry before cooking.  Add spices, herbs, lemon juice or vinegar to vegetables instead of salt, rich sauces or gravies.  Use a non-stick skillet without fat or use no-stick sprays.  Cool and refrigerate stews and broth. Then remove the hardened fat floating on the surface before serving.  Refrigerate meat drippings and skim off fat to make low-fat gravies.  Serve more fish.  Use less butter, margarine and other high-fat spreads on bread or vegetables.  Use skim or reconstituted   non-fat dry milk for cooking.  Cook with low-fat cheeses.  Substitute low-fat yogurt or cottage cheese for all or part of the sour cream in recipes for sauces, dips or congealed salads.  Use half yogurt/half mayonnaise in salad recipes.  Substitute evaporated skim milk for cream. Evaporated skim milk or reconstituted non-fat dry milk can be whipped and substituted for whipped cream in certain recipes.  Choose fresh fruits for dessert instead of high-fat foods such as pies or cakes. Fruits are naturally low in fat. When Dining Out   Order low-fat appetizers such as fruit or vegetable juice, pasta with vegetables or tomato sauce.  Select clear, rather than cream soups.  Ask that dressings and gravies be served on the side. Then use less of them.  Order foods that are baked, broiled, poached, steamed, stir-fried, or roasted.  Ask for margarine instead of butter, and use only a small amount.  Drink sparkling water, unsweetened tea or coffee, or diet soft drinks  instead of alcohol or other sweet beverages. QUESTIONS AND ANSWERS ABOUT OTHER FATS IN THE BLOOD: SATURATED FAT, TRANS FAT, AND CHOLESTEROL What is trans fat? Trans fat is a type of fat that is formed when vegetable oil is hardened through a process called hydrogenation. This process helps makes foods more solid, gives them shape, and prolongs their shelf life. Trans fats are also called hydrogenated or partially hydrogenated oils.  What do saturated fat, trans fat, and cholesterol in foods have to do with heart disease? Saturated fat, trans fat, and cholesterol in the diet all raise the level of LDL "bad" cholesterol in the blood. The higher the LDL cholesterol, the greater the risk for coronary heart disease (CHD). Saturated fat and trans fat raise LDL similarly.  What foods contain saturated fat, trans fat, and cholesterol? High amounts of saturated fat are found in animal products, such as fatty cuts of meat, chicken skin, and full-fat dairy products like butter, whole milk, cream, and cheese, and in tropical vegetable oils such as palm, palm kernel, and coconut oil. Trans fat is found in some of the same foods as saturated fat, such as vegetable shortening, some margarines (especially hard or stick margarine), crackers, cookies, baked goods, fried foods, salad dressings, and other processed foods made with partially hydrogenated vegetable oils. Small amounts of trans fat also occur naturally in some animal products, such as milk products, beef, and lamb. Foods high in cholesterol include liver, other organ meats, egg yolks, shrimp, and full-fat dairy products. How can I use the new food label to make heart-healthy food choices? Check the Nutrition Facts panel of the food label. Choose foods lower in saturated fat, trans fat, and cholesterol. For saturated fat and cholesterol, you can also use the Percent Daily Value (%DV): 5% DV or less is low, and 20% DV or more is high. (There is no %DV for trans  fat.) Use the Nutrition Facts panel to choose foods low in saturated fat and cholesterol, and if the trans fat is not listed, read the ingredients and limit products that list shortening or hydrogenated or partially hydrogenated vegetable oil, which tend to be high in trans fat. POINTS TO REMEMBER:   Discuss your risk for heart disease with your caregivers, and take steps to reduce risk factors.  Change your diet. Choose foods that are low in saturated fat, trans fat, and cholesterol.  Add exercise to your daily routine if it is not already being done. Participate in physical activity of moderate intensity, like   brisk walking, for at least 30 minutes on most, and preferably all days of the week. No time? Break the 30 minutes into three, 10-minute segments during the day.  Stop smoking. If you do smoke, contact your caregiver to discuss ways in which they can help you quit.  Do not use street drugs.  Maintain a normal weight.  Maintain a healthy blood pressure.  Keep up with your blood work for checking the fats in your blood as directed by your caregiver. Document Released: 02/27/2004 Document Revised: 11/10/2011 Document Reviewed: 09/24/2008 [employer] Patient Information 2013 [employer].  

## 2012-11-10 ENCOUNTER — Encounter: Payer: Self-pay | Admitting: General Practice

## 2012-11-10 ENCOUNTER — Ambulatory Visit (INDEPENDENT_AMBULATORY_CARE_PROVIDER_SITE_OTHER): Payer: Medicare Other | Admitting: General Practice

## 2012-11-10 VITALS — BP 133/80 | HR 81 | Temp 98.0°F | Ht 61.0 in | Wt 124.0 lb

## 2012-11-10 DIAGNOSIS — E559 Vitamin D deficiency, unspecified: Secondary | ICD-10-CM

## 2012-11-10 DIAGNOSIS — E119 Type 2 diabetes mellitus without complications: Secondary | ICD-10-CM

## 2012-11-10 DIAGNOSIS — I10 Essential (primary) hypertension: Secondary | ICD-10-CM

## 2012-11-10 DIAGNOSIS — E785 Hyperlipidemia, unspecified: Secondary | ICD-10-CM

## 2012-11-10 LAB — POCT CBC
HCT, POC: 36 % — AB (ref 37.7–47.9)
Lymph, poc: 1 (ref 0.6–3.4)
MCHC: 34.7 g/dL (ref 31.8–35.4)
MCV: 94.6 fL (ref 80–97)
Platelet Count, POC: 146 10*3/uL (ref 142–424)
RDW, POC: 14.7 %
WBC: 4.3 10*3/uL — AB (ref 4.6–10.2)

## 2012-11-10 LAB — COMPLETE METABOLIC PANEL WITH GFR
AST: 15 U/L (ref 0–37)
Albumin: 3.8 g/dL (ref 3.5–5.2)
BUN: 28 mg/dL — ABNORMAL HIGH (ref 6–23)
CO2: 27 mEq/L (ref 19–32)
Calcium: 9 mg/dL (ref 8.4–10.5)
Chloride: 107 mEq/L (ref 96–112)
GFR, Est African American: 61 mL/min
Glucose, Bld: 135 mg/dL — ABNORMAL HIGH (ref 70–99)
Potassium: 4.4 mEq/L (ref 3.5–5.3)

## 2012-11-10 NOTE — Progress Notes (Signed)
  Subjective:    Patient ID: Kathryn Henson, female    DOB: Aug 18, 1922, 79 y.o.   MRN: [id number]  HPI Presents today for 3 month follow up. She has a history hyperlipidemia, hypertension, and diabetes. She reports taking blood sugars daily and ranges 120's. Blood pressure checked periodically. Reports she was unable to take statin prescribed    Review of Systems  Constitutional: Negative for fever and chills.  HENT: Negative for ear pain, sore throat and neck pain.   Respiratory: Negative for chest tightness and shortness of breath.   Cardiovascular: Negative for chest pain and palpitations.  Gastrointestinal: Negative for vomiting, abdominal pain, diarrhea and blood in stool.  Genitourinary: Negative for dysuria, hematuria, difficulty urinating and pelvic pain.  Neurological: Negative for dizziness, weakness, light-headedness and headaches.       Objective:   Physical Exam  Constitutional: She is oriented to person, place, and time. She appears well-developed and well-nourished.  HENT:  Head: Normocephalic and atraumatic.  Right Ear: External ear normal.  Left Ear: External ear normal.  Neck: Normal range of motion.  Cardiovascular: Normal rate, regular rhythm and normal heart sounds.   Pulmonary/Chest: Effort normal and breath sounds normal. No respiratory distress. She exhibits no tenderness.  Abdominal: Soft. Bowel sounds are normal. She exhibits no distension.  Neurological: She is alert and oriented to person, place, and time.  Skin: Skin is warm and dry.  Psychiatric: She has a normal mood and affect.          Assessment & Plan:  1. Unspecified vitamin D deficiency - Vitamin D 25 hydroxy  2. Diabetes - POCT glycosylated hemoglobin (Hb A1C)  3. Essential hypertension, benign - POCT CBC - COMPLETE METABOLIC PANEL WITH GFR  4. Other and unspecified hyperlipidemia - NMR Lipoprofile with Lipids -Continue all current medications Labs pending F/u in 3  months Discussed exercise and diet  -RTO if symptoms develop  -Patient and caregiver verbalized understanding -Coralie Keens, FNP-C

## 2012-11-10 NOTE — Patient Instructions (Signed)

## 2012-11-11 ENCOUNTER — Encounter: Payer: Self-pay | Admitting: *Deleted

## 2012-11-11 LAB — VITAMIN D 25 HYDROXY (VIT D DEFICIENCY, FRACTURES): Vit D, 25-Hydroxy: 63 ng/mL (ref 30–89)

## 2012-11-11 LAB — NMR LIPOPROFILE WITH LIPIDS
Cholesterol, Total: 204 mg/dL — ABNORMAL HIGH (ref <200)
HDL Particle Number: 31.6 umol/L (ref >=30.5)
Large HDL-P: 11.2 umol/L (ref >=4.8)
Large VLDL-P: 1.6 nmol/L (ref <=2.7)
Triglycerides: 55 mg/dL (ref <150)
VLDL Size: 40.1 nm (ref <=46.6)

## 2012-12-19 DIAGNOSIS — Z0289 Encounter for other administrative examinations: Secondary | ICD-10-CM

## 2012-12-22 ENCOUNTER — Ambulatory Visit: Payer: Medicare Other | Admitting: General Practice

## 2013-02-14 ENCOUNTER — Encounter: Payer: Self-pay | Admitting: General Practice

## 2013-02-14 ENCOUNTER — Ambulatory Visit (INDEPENDENT_AMBULATORY_CARE_PROVIDER_SITE_OTHER): Payer: Medicare Other | Admitting: General Practice

## 2013-02-14 VITALS — BP 121/75 | HR 73 | Temp 97.3°F | Ht 61.0 in | Wt 126.0 lb

## 2013-02-14 DIAGNOSIS — E785 Hyperlipidemia, unspecified: Secondary | ICD-10-CM

## 2013-02-14 DIAGNOSIS — I10 Essential (primary) hypertension: Secondary | ICD-10-CM

## 2013-02-14 DIAGNOSIS — E119 Type 2 diabetes mellitus without complications: Secondary | ICD-10-CM

## 2013-02-14 DIAGNOSIS — Z09 Encounter for follow-up examination after completed treatment for conditions other than malignant neoplasm: Secondary | ICD-10-CM

## 2013-02-14 LAB — POCT CBC
Granulocyte percent: 65.2 %G (ref 37–80)
MCV: 93.4 fL (ref 80–97)
MPV: 7.8 fL (ref 0–99.8)
Platelet Count, POC: 158 10*3/uL (ref 142–424)
RBC: 4 M/uL — AB (ref 4.04–5.48)

## 2013-02-14 MED ORDER — LOVASTATIN 20 MG PO TABS: 20.0000 mg | ORAL_TABLET | Freq: Every day | ORAL | Status: DC

## 2013-02-14 MED ORDER — LISINOPRIL 10 MG PO TABS: 10.0000 mg | ORAL_TABLET | Freq: Every day | ORAL | Status: DC

## 2013-02-14 MED ORDER — GLUCOSE BLOOD VI STRP: ORAL_STRIP | Status: DC

## 2013-02-14 MED ORDER — METOPROLOL TARTRATE 50 MG PO TABS: 25.0000 mg | ORAL_TABLET | Freq: Two times a day (BID) | ORAL | Status: DC

## 2013-02-14 MED ORDER — GLIMEPIRIDE 2 MG PO TABS: 2.0000 mg | ORAL_TABLET | Freq: Every day | ORAL | Status: DC

## 2013-02-14 NOTE — Progress Notes (Signed)
  Subjective:    Patient ID: Kathryn Henson, female    DOB: 02/09/1923, 79 y.o.   MRN: [id number]  HPI  Presents today for 3 month follow up. She has a history hyperlipidemia, hypertension, and diabetes. She reports taking blood sugars daily and ranges 110's. Blood pressure checked periodically. Patient and daughter are requesting that patient be seen by a geriatrician to discuss aging and services.      Review of Systems  Constitutional: Negative for fever and chills.  HENT: Negative for ear pain, sore throat and neck pain.   Respiratory: Negative for chest tightness and shortness of breath.   Cardiovascular: Negative for chest pain and palpitations.  Gastrointestinal: Negative for vomiting, abdominal pain, diarrhea and blood in stool.  Genitourinary: Negative for dysuria, hematuria, difficulty urinating and pelvic pain.  Neurological: Negative for dizziness, weakness, light-headedness and headaches.       Objective:   Physical Exam  Constitutional: She is oriented to person, place, and time. She appears well-developed and well-nourished.  HENT:  Head: Normocephalic and atraumatic.  Right Ear: External ear normal.  Left Ear: External ear normal.  Neck: Normal range of motion.  Cardiovascular: Normal rate, regular rhythm and normal heart sounds.   Pulmonary/Chest: Effort normal and breath sounds normal. No respiratory distress. She exhibits no tenderness.  Abdominal: Soft. Bowel sounds are normal. She exhibits no distension.  Neurological: She is alert and oriented to person, place, and time.  Skin: Skin is warm and dry.  Psychiatric: She has a normal mood and affect.          Assessment & Plan:  1. Diabetes - POCT glycosylated hemoglobin (Hb A1C)  2. Hypertension - CMP14+EGFR  3. Other and unspecified hyperlipidemia - NMR, lipoprofile  4. Follow-up exam, 3-6 months since previous exam - POCT CBC - Ambulatory referral to Geriatrics Continue all current  medications Labs pending, cmp, lipid panel F/u in 3 months Discussed exercise and diet  Patient verbalized understanding Coralie Keens, FNP-C

## 2013-02-15 LAB — CMP14+EGFR
Albumin: 4.1 g/dL (ref 3.5–4.7)
BUN: 24 mg/dL (ref 8–27)
CO2: 28 mmol/L (ref 18–29)
Chloride: 103 mmol/L (ref 97–108)
GFR calc Af Amer: 62 mL/min/{1.73_m2} (ref >59)
Glucose: 103 mg/dL — ABNORMAL HIGH (ref 65–99)
Total Protein: 6.5 g/dL (ref 6.0–8.5)

## 2013-02-15 LAB — NMR, LIPOPROFILE
HDL Cholesterol by NMR: 68 mg/dL (ref >=40)
LDL Particle Number: 1345 nmol/L — ABNORMAL HIGH (ref <1000)
LDL Size: 21.3 nm (ref >20.5)
LDLC SERPL CALC-MCNC: 137 mg/dL — ABNORMAL HIGH (ref <100)
Triglycerides by NMR: 65 mg/dL (ref <150)

## 2013-03-14 ENCOUNTER — Other Ambulatory Visit: Payer: Self-pay | Admitting: General Practice

## 2013-03-14 DIAGNOSIS — E119 Type 2 diabetes mellitus without complications: Secondary | ICD-10-CM

## 2013-03-14 MED ORDER — GLIMEPIRIDE 2 MG PO TABS: ORAL_TABLET | ORAL | Status: DC

## 2013-03-30 ENCOUNTER — Other Ambulatory Visit: Payer: Self-pay

## 2013-05-12 ENCOUNTER — Encounter: Payer: Self-pay | Admitting: Family Medicine

## 2013-05-12 ENCOUNTER — Ambulatory Visit (INDEPENDENT_AMBULATORY_CARE_PROVIDER_SITE_OTHER): Payer: Medicare Other | Admitting: Family Medicine

## 2013-05-12 VITALS — BP 134/77 | HR 68 | Temp 98.0°F | Ht 61.0 in | Wt 129.6 lb

## 2013-05-12 DIAGNOSIS — M810 Age-related osteoporosis without current pathological fracture: Secondary | ICD-10-CM

## 2013-05-12 DIAGNOSIS — I5181 Takotsubo syndrome: Secondary | ICD-10-CM

## 2013-05-12 DIAGNOSIS — I10 Essential (primary) hypertension: Secondary | ICD-10-CM

## 2013-05-12 DIAGNOSIS — E785 Hyperlipidemia, unspecified: Secondary | ICD-10-CM

## 2013-05-12 DIAGNOSIS — I152 Hypertension secondary to endocrine disorders: Secondary | ICD-10-CM | POA: Insufficient documentation

## 2013-05-12 DIAGNOSIS — J309 Allergic rhinitis, unspecified: Secondary | ICD-10-CM

## 2013-05-12 DIAGNOSIS — R55 Syncope and collapse: Secondary | ICD-10-CM

## 2013-05-12 DIAGNOSIS — Z950 Presence of cardiac pacemaker: Secondary | ICD-10-CM | POA: Insufficient documentation

## 2013-05-12 DIAGNOSIS — C801 Malignant (primary) neoplasm, unspecified: Secondary | ICD-10-CM

## 2013-05-12 DIAGNOSIS — E119 Type 2 diabetes mellitus without complications: Secondary | ICD-10-CM | POA: Insufficient documentation

## 2013-05-12 DIAGNOSIS — R5381 Other malaise: Secondary | ICD-10-CM

## 2013-05-12 DIAGNOSIS — E1159 Type 2 diabetes mellitus with other circulatory complications: Secondary | ICD-10-CM | POA: Insufficient documentation

## 2013-05-12 DIAGNOSIS — IMO0002 Reserved for concepts with insufficient information to code with codable children: Secondary | ICD-10-CM | POA: Insufficient documentation

## 2013-05-12 DIAGNOSIS — H409 Unspecified glaucoma: Secondary | ICD-10-CM | POA: Insufficient documentation

## 2013-05-12 LAB — POCT UA - MICROALBUMIN: Microalbumin Ur, POC: NEGATIVE mg/L

## 2013-05-12 MED ORDER — FLUTICASONE PROPIONATE 50 MCG/ACT NA SUSP: 2.0000 | Freq: Every day | NASAL | Status: DC

## 2013-05-12 MED ORDER — METOPROLOL TARTRATE 50 MG PO TABS: 25.0000 mg | ORAL_TABLET | Freq: Two times a day (BID) | ORAL | Status: DC

## 2013-05-12 NOTE — Patient Instructions (Signed)
      Dr Saarah Dewing's Recommendations  For nutrition information, I recommend books:  1).Eat to Live by Dr Joel Fuhrman. 2).Prevent and Reverse Heart Disease by Dr Caldwell Esselstyn. 3) Dr Neal Barnard's Book:  Program to Reverse Diabetes  Exercise recommendations are:  If unable to walk, then the patient can exercise in a chair 3 times a day. By flapping arms like a bird gently and raising legs outwards to the front.  If ambulatory, the patient can go for walks for 30 minutes 3 times a week. Then increase the intensity and duration as tolerated.  Goal is to try to attain exercise frequency to 5 times a week.  If applicable: Best to perform resistance exercises (machines or weights) 2 days a week and cardio type exercises 3 days per week.  

## 2013-05-12 NOTE — Progress Notes (Signed)
Patient ID: Kathryn Henson, female   DOB: 09-Sep-1922, 79 y.o.   MRN: [id number] SUBJECTIVE: CC: Chief Complaint  Patient presents with  . Diabetes  . Hypertension  . 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 He had renal failure. He passed away recently. Has certain concerns. Saw her cardiologist yesterday: Dr Roxan Hockey at [facility]. Everything was okay has a pacemaker Had a lumpectomy in 2003. Left breast cancer. Radiation therapy at [facility]. Dr Maple Hudson was her surgeon. Last mamm[facility] was [facility] this [facility].  Patient is here for follow up of Diabetes Mellitus/HTN/HLD: Symptoms evaluated: Denies Nocturia ,Denies Urinary Frequency , denies Blurred vision ,deniesDizziness,denies.Dysuria,denies paresthesias, denies extremity pain or ulcers.Marland Kitchendenies chest pain. has had an annual eye exam. do check the feet. Does check CBGs. Average CBG:< 120 Denies episodes of hypoglycemia. Does have an emergency hypoglycemic plan. admits toCompliance with medications. Denies Problems with medications.   Past Medical History  Diagnosis Date  . Pacemaker   . Diabetes mellitus without complication   . Glaucoma   . Hyperlipidemia   . Hypertension   . Cancer     left breast  . Osteoporosis    Past Surgical History  Procedure Laterality Date  . Pacemaker insertion    . Lumpectomy left breast     History   Social History  . Marital Status: Married    Spouse Name: N/A    Number of Children: N/A  . Years of Education: N/A   Occupational History  . Not on file.   Social History Main Topics  . Smoking status: Former Smoker    Types: Cigarettes    Quit date: 05/25/1968  . Smokeless tobacco: Not on file  . Alcohol Use: No  . Drug Use: No  . Sexual Activity: Not on file   Other Topics Concern  . Not on file   Social History Narrative  . No narrative on file   Family History  Problem Relation Age of Onset  . Heart disease Mother   .  Diabetes Mother   . Stroke Father    Current Outpatient Prescriptions on File Prior to Visit  Medication Sig Dispense Refill  . aspirin 81 MG tablet Take 81 mg by mouth daily.      . Cholecalciferol (VITAMIN D-1000 MAX ST) 1000 UNITS tablet Take 1,000 Units by mouth daily.      . Coenzyme Q10 (COQ-10) 200 MG CAPS 200 mg. Take 200 mg by mouth daily.      Marland Kitchen glimepiride (AMARYL) 2 MG tablet Take 1 tablet by mouth twice daily with food  60 tablet  3  . glucose blood (ACCU-CHEK COMPACT PLUS) test strip Check blood sugar 1-2 times daily  100 each  3  . lisinopril (PRINIVIL,ZESTRIL) 10 MG tablet Take 1 tablet (10 mg total) by mouth daily. Take 10 mg by mouth daily.  30 tablet  3  . lovastatin (MEVACOR) 20 MG tablet Take 1 tablet (20 mg total) by mouth at bedtime. Take 1 tablet (20 mg total) by mouth nightly.  39 tablet  3   No current facility-administered medications on file prior to visit.   Allergies  Allergen Reactions  . Atorvastatin     Other reaction(s): Myalgias (intolerance)  . Fluoride Preparations   . Influenza Vaccines Other (See Comments)    Aches at site for over a year.  . Metformin Nausea Only  . Other     Other reaction(s): Swelling (ALLERGY/intolerance)  Immunization History  Administered Date(s) Administered  . Pneumococcal Polysaccharide-23 05/25/1994   Prior to Admission medications   Medication Sig Start Date End Date Taking? Authorizing Provider  aspirin 81 MG tablet Take 81 mg by mouth daily.   Yes Historical Provider, MD  Cholecalciferol (VITAMIN D-1000 MAX ST) 1000 UNITS tablet Take 1,000 Units by mouth daily.   Yes Historical Provider, MD  Coenzyme Q10 (COQ-10) 200 MG CAPS 200 mg. Take 200 mg by mouth daily.   Yes Historical Provider, MD  glimepiride (AMARYL) 2 MG tablet Take 1 tablet by mouth twice daily with food 03/14/13  Yes Mae Shelda Altes, FNP  glucose blood (ACCU-CHEK COMPACT PLUS) test strip Check blood sugar 1-2 times daily 02/14/13  Yes Mae Shelda Altes, FNP  lisinopril (PRINIVIL,ZESTRIL) 10 MG tablet Take 1 tablet (10 mg total) by mouth daily. Take 10 mg by mouth daily. 02/14/13  Yes Mae Shelda Altes, FNP  lovastatin (MEVACOR) 20 MG tablet Take 1 tablet (20 mg total) by mouth at bedtime. Take 1 tablet (20 mg total) by mouth nightly. 02/14/13 02/14/14 Yes Mae Shelda Altes, FNP  metoprolol (LOPRESSOR) 50 MG tablet Take 0.5 tablets (25 mg total) by mouth 2 (two) times daily. 02/14/13  Yes Mae Shelda Altes, FNP     ROS: As above in the HPI. All other systems are stable or negative.  OBJECTIVE: APPEARANCE:  Patient in no acute distress.The patient appeared well nourished and normally developed. Acyanotic. Waist: VITAL SIGNS:BP 134/77  Pulse 68  Temp(Src) 98 F (36.7 C) (Oral)  Ht 5\' 1"  (1.549 m)  Wt 129 lb 9.6 oz (58.786 kg)  BMI 24.50 kg/m2 AAF  SKIN: warm and  Dry without overt rashes, tattoos and scars  HEAD and Neck: without JVD, Head and scalp: normal Eyes:No scleral icterus. Fundi normal, eye movements normal. Ears: Auricle normal, canal normal, Tympanic membranes normal, insufflation normal. Nose: nasal congestion Throat: normal Neck & thyroid: normal  CHEST & LUNGS: Chest wall: normal Lungs: Clear  Breast Exam:  Appearance:  Skin : abnormal on the left with surgical scar and depression Areolas left abnormal, retracted from the surgical scar Nipples left is retracted abnormal Dimples:  present  Palpation: scarring left breast from surgery and radiation treatment Lymph Nodes: Axillary: Normal, no abnormality detected  CVS: Reveals the PMI to be normally located. Regular rhythm, First and Second Heart sounds are normal,  absence of murmurs, rubs or gallops. Peripheral vasculature: Radial pulses: normal Dorsal pedis pulses: normal Posterior pulses: normal  ABDOMEN:  Appearance: normal Benign, no organomegaly, no masses, no Abdominal Aortic enlargement. No Guarding , no rebound. No Bruits. Bowel  sounds: normal  RECTAL: N/A GU: N/A  EXTREMETIES: nonedematous.  MUSCULOSKELETAL:  Spine: normal Joints: intact  NEUROLOGIC: oriented to time,place and person; nonfocal.  Results for orders placed in visit on 05/12/13  POCT UA - MICROALBUMIN      Result Value Range   Microalbumin Ur, POC neg      ASSESSMENT: Hypertension - Plan: CMP14+EGFR  Diabetes - Plan: POCT UA - Microalbumin, CMP14+EGFR, Hemoglobin A1c, CANCELED: POCT glycosylated hemoglobin (Hb A1C)  Hyperlipemia - Plan: CMP14+EGFR, NMR, lipoprofile  Other malaise and fatigue - Plan: POCT CBC  Allergic rhinitis - Plan: fluticasone (FLONASE) 50 MCG/ACT nasal spray  Stress-induced cardiomyopathy  Syncope and collapse  Pacemaker - Plan: metoprolol (LOPRESSOR) 50 MG tablet  Glaucoma  Cancer  Osteoporosis  PLAN:      Dr Woodroe Mode Recommendations  For nutrition information, I recommend books:  1).Eat  to Live by Dr Monico Hoar. 2).Prevent and Reverse Heart Disease by Dr Suzzette Righter. 3) Dr Katherina Right Book:  Program to Reverse Diabetes  Exercise recommendations are:  If unable to walk, then the patient can exercise in a chair 3 times a day. By flapping arms like a bird gently and raising legs outwards to the front.  If ambulatory, the patient can go for walks for 30 minutes 3 times a week. Then increase the intensity and duration as tolerated.  Goal is to try to attain exercise frequency to 5 times a week.  If applicable: Best to perform resistance exercises (machines or weights) 2 days a week and cardio type exercises 3 days per week.  Discussed with patient her medications and side  Effects. Also, discussed with patient aging and bodily changes.  Discussed about diet and exercise and its benefits.   Orders Placed This Encounter  Procedures  . CMP14+EGFR  . NMR, lipoprofile  . Hemoglobin A1c  . POCT UA - Microalbumin  . POCT CBC   Meds ordered this encounter  Medications  .  fluticasone (FLONASE) 50 MCG/ACT nasal spray    Sig: Place 2 sprays into both nostrils daily.    Dispense:  16 g    Refill:  3  . metoprolol (LOPRESSOR) 50 MG tablet    Sig: Take 0.5 tablets (25 mg total) by mouth 2 (two) times daily.    Dispense:  90 tablet    Refill:  3    Order Specific Question:  Supervising Provider    Answer:  Ernestina Penna [[id number]]   Medications Discontinued During This Encounter  Medication Reason  . metoprolol (LOPRESSOR) 50 MG tablet Reorder   Return in about 3 months (around 08/10/2013) for Recheck medical problems.  Jolane Bankhead P. Modesto Charon, M.D.

## 2013-05-13 ENCOUNTER — Encounter: Payer: Self-pay | Admitting: Family Medicine

## 2013-05-13 DIAGNOSIS — M810 Age-related osteoporosis without current pathological fracture: Secondary | ICD-10-CM | POA: Insufficient documentation

## 2013-05-13 DIAGNOSIS — E1169 Type 2 diabetes mellitus with other specified complication: Secondary | ICD-10-CM | POA: Insufficient documentation

## 2013-05-13 DIAGNOSIS — J309 Allergic rhinitis, unspecified: Secondary | ICD-10-CM | POA: Insufficient documentation

## 2013-05-13 DIAGNOSIS — C801 Malignant (primary) neoplasm, unspecified: Secondary | ICD-10-CM | POA: Insufficient documentation

## 2013-05-13 DIAGNOSIS — E118 Type 2 diabetes mellitus with unspecified complications: Secondary | ICD-10-CM | POA: Insufficient documentation

## 2013-05-13 DIAGNOSIS — R5383 Other fatigue: Secondary | ICD-10-CM | POA: Insufficient documentation

## 2013-05-13 DIAGNOSIS — M8580 Other specified disorders of bone density and structure, unspecified site: Secondary | ICD-10-CM | POA: Insufficient documentation

## 2013-05-14 ENCOUNTER — Other Ambulatory Visit: Payer: Self-pay | Admitting: Family Medicine

## 2013-05-14 DIAGNOSIS — E119 Type 2 diabetes mellitus without complications: Secondary | ICD-10-CM

## 2013-05-14 LAB — HEMOGLOBIN A1C
Est. average glucose Bld gHb Est-mCnc: 171 mg/dL
Hgb A1c MFr Bld: 7.6 % — ABNORMAL HIGH (ref 4.8–5.6)

## 2013-05-14 LAB — CMP14+EGFR
ALT: 14 IU/L (ref 0–32)
AST: 17 IU/L (ref 0–40)
Albumin/Globulin Ratio: 1.6 (ref 1.1–2.5)
Albumin: 4.2 g/dL (ref 3.5–4.7)
Alkaline Phosphatase: 98 IU/L (ref 39–117)
BUN/Creatinine Ratio: 19 (ref 11–26)
BUN: 18 mg/dL (ref 8–27)
CO2: 27 mmol/L (ref 18–29)
Calcium: 9.2 mg/dL (ref 8.6–10.2)
Chloride: 102 mmol/L (ref 97–108)
Creatinine, Ser: 0.97 mg/dL (ref 0.57–1.00)
GFR calc Af Amer: 60 mL/min/{1.73_m2} (ref >59)
GFR calc non Af Amer: 52 mL/min/{1.73_m2} — ABNORMAL LOW (ref >59)
Globulin, Total: 2.6 g/dL (ref 1.5–4.5)
Glucose: 103 mg/dL — ABNORMAL HIGH (ref 65–99)
Potassium: 3.9 mmol/L (ref 3.5–5.2)
Sodium: 142 mmol/L (ref 134–144)
Total Bilirubin: 0.3 mg/dL (ref 0.0–1.2)
Total Protein: 6.8 g/dL (ref 6.0–8.5)

## 2013-05-14 LAB — NMR, LIPOPROFILE
Cholesterol: 242 mg/dL — ABNORMAL HIGH (ref <200)
HDL Cholesterol by NMR: 77 mg/dL (ref >=40)
HDL Particle Number: 37.5 umol/L (ref >=30.5)
LDL Particle Number: 1328 nmol/L — ABNORMAL HIGH (ref <1000)
LDL Size: 21.3 nm (ref >20.5)
LDLC SERPL CALC-MCNC: 135 mg/dL — ABNORMAL HIGH (ref <100)
LP-IR Score: <25 (ref <=45)
Small LDL Particle Number: 144 nmol/L (ref <=527)
Triglycerides by NMR: 148 mg/dL (ref <150)

## 2013-05-14 MED ORDER — SITAGLIPTIN PHOSPHATE 50 MG PO TABS: 50.0000 mg | ORAL_TABLET | Freq: Every day | ORAL | Status: DC

## 2013-05-26 ENCOUNTER — Telehealth: Payer: Self-pay | Admitting: Family Medicine

## 2013-05-26 ENCOUNTER — Encounter (INDEPENDENT_AMBULATORY_CARE_PROVIDER_SITE_OTHER): Payer: Medicare Other

## 2013-05-26 NOTE — Telephone Encounter (Signed)
Pt walked in today and appt sch for Jan 5 Monday

## 2013-05-29 ENCOUNTER — Encounter: Payer: Self-pay | Admitting: Family Medicine

## 2013-05-29 ENCOUNTER — Ambulatory Visit (INDEPENDENT_AMBULATORY_CARE_PROVIDER_SITE_OTHER): Payer: Medicare Other | Admitting: Family Medicine

## 2013-05-29 VITALS — BP 152/79 | HR 83 | Temp 97.3°F | Ht 61.0 in | Wt 131.4 lb

## 2013-05-29 DIAGNOSIS — Z950 Presence of cardiac pacemaker: Secondary | ICD-10-CM

## 2013-05-29 DIAGNOSIS — I10 Essential (primary) hypertension: Secondary | ICD-10-CM

## 2013-05-29 DIAGNOSIS — I5181 Takotsubo syndrome: Secondary | ICD-10-CM

## 2013-05-29 DIAGNOSIS — H409 Unspecified glaucoma: Secondary | ICD-10-CM

## 2013-05-29 DIAGNOSIS — L0291 Cutaneous abscess, unspecified: Secondary | ICD-10-CM

## 2013-05-29 DIAGNOSIS — M810 Age-related osteoporosis without current pathological fracture: Secondary | ICD-10-CM

## 2013-05-29 DIAGNOSIS — L0390 Cellulitis, unspecified: Secondary | ICD-10-CM | POA: Insufficient documentation

## 2013-05-29 DIAGNOSIS — L820 Inflamed seborrheic keratosis: Secondary | ICD-10-CM | POA: Insufficient documentation

## 2013-05-29 DIAGNOSIS — E119 Type 2 diabetes mellitus without complications: Secondary | ICD-10-CM

## 2013-05-29 DIAGNOSIS — E785 Hyperlipidemia, unspecified: Secondary | ICD-10-CM

## 2013-05-29 DIAGNOSIS — C801 Malignant (primary) neoplasm, unspecified: Secondary | ICD-10-CM

## 2013-05-29 MED ORDER — MUPIROCIN 2 % EX OINT: 1.0000 "application " | TOPICAL_OINTMENT | Freq: Three times a day (TID) | CUTANEOUS | Status: AC

## 2013-05-29 NOTE — Progress Notes (Signed)
Patient ID: Kathryn Henson, female   DOB: Dec 15, 1922, 80 y.o.   MRN: [id number] SUBJECTIVE: CC: Chief Complaint  Patient presents with  . Follow-up    ck place on leg concerned about being on januvia. pt has not started the [place]    HPI:  Past Medical History  Diagnosis Date  . Pacemaker   . Diabetes mellitus without complication   . Glaucoma   . Hyperlipidemia   . Hypertension   . Cancer     left breast  . Osteoporosis    Past Surgical History  Procedure Laterality Date  . Pacemaker insertion    . Lumpectomy left breast     History   Social History  . Marital Status: Married    Spouse Name: N/A    Number of Children: N/A  . Years of Education: N/A   Occupational History  . Not on file.   Social History Main Topics  . Smoking status: Former Smoker    Types: Cigarettes    Quit date: 05/25/1968  . Smokeless tobacco: Not on file  . Alcohol Use: No  . Drug Use: No  . Sexual Activity: Not on file   Other Topics Concern  . Not on file   Social History Narrative  . No narrative on file   Family History  Problem Relation Age of Onset  . Heart disease Mother   . Diabetes Mother   . Stroke Father    Current Outpatient Prescriptions on File Prior to Visit  Medication Sig Dispense Refill  . aspirin 81 MG tablet Take 81 mg by mouth daily.      . Cholecalciferol (VITAMIN D-1000 MAX ST) 1000 UNITS tablet Take 1,000 Units by mouth daily.      . Coenzyme Q10 (COQ-10) 200 MG CAPS 200 mg. Take 200 mg by mouth daily.      . fluticasone (FLONASE) 50 MCG/ACT nasal spray Place 2 sprays into both nostrils daily.  16 g  3  . glimepiride (AMARYL) 2 MG tablet Take 1 tablet by mouth twice daily with food  60 tablet  3  . glucose blood (ACCU-CHEK COMPACT PLUS) test strip Check blood sugar 1-2 times daily  100 each  3  . lisinopril (PRINIVIL,ZESTRIL) 10 MG tablet Take 1 tablet (10 mg total) by mouth daily. Take 10 mg by mouth daily.  30 tablet  3  . lovastatin (MEVACOR) 20 MG  tablet Take 1 tablet (20 mg total) by mouth at bedtime. Take 1 tablet (20 mg total) by mouth nightly.  39 tablet  3  . metoprolol (LOPRESSOR) 50 MG tablet Take 0.5 tablets (25 mg total) by mouth 2 (two) times daily.  90 tablet  3  . sitaGLIPtin (JANUVIA) 50 MG tablet Take 1 tablet (50 mg total) by mouth daily.  30 tablet  3   No current facility-administered medications on file prior to visit.   Allergies  Allergen Reactions  . Atorvastatin     Other reaction(s): Myalgias (intolerance)  . Fluoride Preparations   . Influenza Vaccines Other (See Comments)    Aches at site for over a year.  . Metformin Nausea Only  . Other     Other reaction(s): Swelling (ALLERGY/intolerance)   Immunization History  Administered Date(s) Administered  . Pneumococcal Polysaccharide-23 05/25/1994   Prior to Admission medications   Medication Sig Start Date End Date Taking? Authorizing Provider  aspirin 81 MG tablet Take 81 mg by mouth daily.   Yes Historical Provider, MD  Cholecalciferol (VITAMIN  D-1000 MAX ST) 1000 UNITS tablet Take 1,000 Units by mouth daily.   Yes Historical Provider, MD  Coenzyme Q10 (COQ-10) 200 MG CAPS 200 mg. Take 200 mg by mouth daily.   Yes Historical Provider, MD  fluticasone (FLONASE) 50 MCG/ACT nasal spray Place 2 sprays into both nostrils daily. 05/12/13  Yes Vernie Shanks, MD  glimepiride (AMARYL) 2 MG tablet Take 1 tablet by mouth twice daily with food 03/14/13  Yes Mae Loree Fee, FNP  glucose blood (ACCU-CHEK COMPACT PLUS) test strip Check blood sugar 1-2 times daily 02/14/13  Yes Mae Loree Fee, FNP  lisinopril (PRINIVIL,ZESTRIL) 10 MG tablet Take 1 tablet (10 mg total) by mouth daily. Take 10 mg by mouth daily. 02/14/13  Yes Mae Loree Fee, FNP  lovastatin (MEVACOR) 20 MG tablet Take 1 tablet (20 mg total) by mouth at bedtime. Take 1 tablet (20 mg total) by mouth nightly. 02/14/13 02/14/14 Yes Mae Loree Fee, FNP  metoprolol (LOPRESSOR) 50 MG tablet Take 0.5 tablets  (25 mg total) by mouth 2 (two) times daily. 05/12/13  Yes Vernie Shanks, MD  sitaGLIPtin (JANUVIA) 50 MG tablet Take 1 tablet (50 mg total) by mouth daily. 05/14/13  Yes Vernie Shanks, MD     ROS: As above in the HPI. All other systems are stable or negative.  OBJECTIVE: APPEARANCE:  Patient in no acute distress.The patient appeared well nourished and normally developed. Acyanotic. Waist: VITAL SIGNS:BP 152/79  Pulse 83  Temp(Src) 97.3 F (36.3 C) (Oral)  Ht _0  (1.549 m)  Wt 131 lb 6.4 oz (59.603 kg)  BMI 24.84 kg/m2   SKIN: warm and  Dry without, tattoos scattered nevi and seborrheic keratotic lesions. One on the right proximal leg appears irritated and red. It is 7 mm diameter  HEAD and Neck: without JVD, Head and scalp: normal Eyes:No scleral icterus. Fundi normal, eye movements normal. Ears: Auricle normal, canal normal, Tympanic membranes normal, insufflation normal. Nose: normal Throat: normal Neck & thyroid: normal  CHEST & LUNGS: Chest wall: normal Lungs: Clear  CVS: Reveals the PMI to be normally located. Regular rhythm, First and Second Heart sounds are normal,  absence of murmurs, rubs or gallops. Peripheral vasculature: Radial pulses: normal Dorsal pedis pulses: normal Posterior pulses: normal  ABDOMEN:  Appearance: normal Benign, no organomegaly, no masses, no Abdominal Aortic enlargement. No Guarding , no rebound. No Bruits. Bowel sounds: normal  RECTAL: N/A GU: N/A  EXTREMETIES: nonedematous. Results for orders placed in visit on 05/12/13  CMP14+EGFR      Result Value Range   Glucose 103 (*) 65 - 99 mg/dL   BUN 18  8 - 27 mg/dL   Creatinine, Ser 0.97  0.57 - 1.00 mg/dL   GFR calc non Af Amer 52 (*) >59 mL/min/1.73   GFR calc Af Amer 60  >59 mL/min/1.73   BUN/Creatinine Ratio 19  11 - 26   Sodium 142  134 - 144 mmol/L   Potassium 3.9  3.5 - 5.2 mmol/L   Chloride 102  97 - 108 mmol/L   CO2 27  18 - 29 mmol/L   Calcium 9.2  8.6 -  10.2 mg/dL   Total Protein 6.8  6.0 - 8.5 g/dL   Albumin 4.2  3.5 - 4.7 g/dL   Globulin, Total 2.6  1.5 - 4.5 g/dL   Albumin/Globulin Ratio 1.6  1.1 - 2.5   Total Bilirubin 0.3  0.0 - 1.2 mg/dL   Alkaline Phosphatase 98  39 - 117  IU/L   AST 17  0 - 40 IU/L   ALT 14  0 - 32 IU/L  NMR, LIPOPROFILE      Result Value Range   LDL Particle Number [id number] (*) <1000 nmol/L   LDLC SERPL CALC-MCNC 135 (*) <100 mg/dL   HDL Cholesterol by NMR 77  >=40 mg/dL   Triglycerides by NMR 148  <150 mg/dL   Cholesterol 242 (*) <200 mg/dL   HDL Particle Number 37.5  >=30.5 umol/L   Small LDL Particle Number 144  <=527 nmol/L   LDL Size 21.3  >20.5 nm   LP-IR Score < 25  <= 45  HEMOGLOBIN A1C      Result Value Range   Hemoglobin A1C 7.6 (*) 4.8 - 5.6 %   Estimated average glucose 171    POCT UA - MICROALBUMIN      Result Value Range   Microalbumin Ur, POC neg       ASSESSMENT: Cellulitis - Plan: mupirocin ointment (BACTROBAN) 2 %  Seborrheic keratoses, inflamed  Diabetes mellitus without complication  Cancer  Hyperlipidemia  Pacemaker  Stress-induced cardiomyopathy  Hypertension  Glaucoma  Osteoporosis   Patient afraid to start the januvia.  PLAN:  Recheck right leg in 4 weeks. Hold the [place] until the next [facility] in March. Avoid scratching and irritating the skin lesion that looks like it was initially a seborrheic keratosis. Discussed with the daughter over the cell phone the lab results and recommendations but since patient is  So afraid of medications and believes that the higher result is due to lab error. Also the daughter believe since patient is a new widow that the stress most likely is contributory to the rise in HGBA1C.   No orders of the defined types were placed in this encounter.   Meds ordered this encounter  Medications  . mupirocin ointment (BACTROBAN) 2 %    Sig: Place 1 application into the nose 3 (three) times daily. Apply to the right leg    Dispense:   30 g    Refill:  0   There are no discontinued medications. Return in about 4 weeks (around 06/26/2013) for recheck right leg lesion.Dub Mikes P. Jacelyn Grip, M.D.

## 2013-05-29 NOTE — Patient Instructions (Signed)
Recheck right leg in 4 weeks. Hold the [place] until the next [facility] in March.

## 2013-05-30 ENCOUNTER — Telehealth: Payer: Self-pay | Admitting: Family Medicine

## 2013-05-30 NOTE — Telephone Encounter (Signed)
Pt notified and cook book at front desk

## 2013-06-06 ENCOUNTER — Other Ambulatory Visit: Payer: Self-pay

## 2013-06-06 DIAGNOSIS — Z1231 Encounter for screening mammogram for malignant neoplasm of breast: Secondary | ICD-10-CM

## 2013-06-26 ENCOUNTER — Ambulatory Visit
Admission: RE | Admit: 2013-06-26 | Discharge: 2013-06-26 | Disposition: A | Discharge status: Home / Self Care | Payer: Medicare Other | Source: Ambulatory Visit

## 2013-06-26 DIAGNOSIS — Z1231 Encounter for screening mammogram for malignant neoplasm of breast: Secondary | ICD-10-CM

## 2013-08-11 ENCOUNTER — Ambulatory Visit (INDEPENDENT_AMBULATORY_CARE_PROVIDER_SITE_OTHER): Payer: Medicare Other

## 2013-08-11 ENCOUNTER — Ambulatory Visit (INDEPENDENT_AMBULATORY_CARE_PROVIDER_SITE_OTHER): Payer: Medicare Other | Admitting: Family Medicine

## 2013-08-11 ENCOUNTER — Telehealth: Payer: Self-pay | Admitting: Nurse Practitioner

## 2013-08-11 ENCOUNTER — Encounter: Payer: Self-pay | Admitting: Family Medicine

## 2013-08-11 VITALS — BP 124/77 | HR 75 | Temp 97.8°F | Ht 61.0 in | Wt 134.2 lb

## 2013-08-11 DIAGNOSIS — Z950 Presence of cardiac pacemaker: Secondary | ICD-10-CM

## 2013-08-11 DIAGNOSIS — M129 Arthropathy, unspecified: Secondary | ICD-10-CM

## 2013-08-11 DIAGNOSIS — M25473 Effusion, unspecified ankle: Secondary | ICD-10-CM

## 2013-08-11 DIAGNOSIS — H409 Unspecified glaucoma: Secondary | ICD-10-CM

## 2013-08-11 DIAGNOSIS — M810 Age-related osteoporosis without current pathological fracture: Secondary | ICD-10-CM

## 2013-08-11 DIAGNOSIS — M1990 Unspecified osteoarthritis, unspecified site: Secondary | ICD-10-CM | POA: Insufficient documentation

## 2013-08-11 DIAGNOSIS — E785 Hyperlipidemia, unspecified: Secondary | ICD-10-CM

## 2013-08-11 DIAGNOSIS — M25476 Effusion, unspecified foot: Secondary | ICD-10-CM

## 2013-08-11 DIAGNOSIS — E119 Type 2 diabetes mellitus without complications: Secondary | ICD-10-CM

## 2013-08-11 DIAGNOSIS — C801 Malignant (primary) neoplasm, unspecified: Secondary | ICD-10-CM

## 2013-08-11 DIAGNOSIS — I10 Essential (primary) hypertension: Secondary | ICD-10-CM

## 2013-08-11 DIAGNOSIS — I5181 Takotsubo syndrome: Secondary | ICD-10-CM

## 2013-08-11 LAB — POCT CBC
Granulocyte percent: 48.3 %G (ref 37–80)
HCT, POC: 38.5 % (ref 37.7–47.9)
Hemoglobin: 12.2 g/dL (ref 12.2–16.2)
Lymph, poc: 2.4 (ref 0.6–3.4)
MCH, POC: 30.1 pg (ref 27–31.2)
MCHC: 31.7 g/dL — AB (ref 31.8–35.4)
MCV: 94.9 fL (ref 80–97)
MPV: 7.7 fL (ref 0–99.8)
POC Granulocyte: 2.7 (ref 2–6.9)
POC LYMPH PERCENT: 43.3 %L (ref 10–50)
Platelet Count, POC: 185 10*3/uL (ref 142–424)
RBC: 4.1 M/uL (ref 4.04–5.48)
RDW, POC: 14.7 %
WBC: 5.5 10*3/uL (ref 4.6–10.2)

## 2013-08-11 MED ORDER — MELOXICAM 15 MG PO TABS: 15.0000 mg | ORAL_TABLET | Freq: Every day | ORAL | Status: DC

## 2013-08-11 NOTE — Progress Notes (Signed)
Patient ID: Kathryn Henson, female   DOB: 01-22-1923, 80 y.o.   MRN: [id number] SUBJECTIVE: CC: Chief Complaint  Patient presents with  . Acute Visit    c/o swelling in left ankle no known injury    HPI:  Medial malleolus of the left ankle is tender and adjacent soft tissue swelling. No history of gout. No fever , no injury. There is a family history of gout. Never happened before. Very tender to touch.  Past Medical History  Diagnosis Date  . Pacemaker   . Diabetes mellitus without complication   . Glaucoma   . Hyperlipidemia   . Hypertension   . Cancer     left breast  . Osteoporosis    Past Surgical History  Procedure Laterality Date  . Pacemaker insertion    . Lumpectomy left breast     History   Social History  . Marital Status: Married    Spouse Name: N/A    Number of Children: N/A  . Years of Education: N/A   Occupational History  . Not on file.   Social History Main Topics  . Smoking status: Former Smoker    Types: Cigarettes    Quit date: 05/25/1968  . Smokeless tobacco: Not on file  . Alcohol Use: No  . Drug Use: No  . Sexual Activity: Not on file   Other Topics Concern  . Not on file   Social History Narrative  . No narrative on file   Family History  Problem Relation Age of Onset  . Heart disease Mother   . Diabetes Mother   . Stroke Father    Current Outpatient Prescriptions on File Prior to Visit  Medication Sig Dispense Refill  . aspirin 81 MG tablet Take 81 mg by mouth daily.      . Cholecalciferol (VITAMIN D-1000 MAX ST) 1000 UNITS tablet Take 1,000 Units by mouth daily.      . Coenzyme Q10 (COQ-10) 200 MG CAPS 200 mg. Take 200 mg by mouth daily.      . fluticasone (FLONASE) 50 MCG/ACT nasal spray Place 2 sprays into both nostrils daily.  16 g  3  . glimepiride (AMARYL) 2 MG tablet Take 1 tablet by mouth twice daily with food  60 tablet  3  . glucose blood (ACCU-CHEK COMPACT PLUS) test strip Check blood sugar 1-2 times daily  100  each  3  . lisinopril (PRINIVIL,ZESTRIL) 10 MG tablet Take 1 tablet (10 mg total) by mouth daily. Take 10 mg by mouth daily.  30 tablet  3  . lovastatin (MEVACOR) 20 MG tablet Take 1 tablet (20 mg total) by mouth at bedtime. Take 1 tablet (20 mg total) by mouth nightly.  39 tablet  3  . metoprolol (LOPRESSOR) 50 MG tablet Take 0.5 tablets (25 mg total) by mouth 2 (two) times daily.  90 tablet  3  . sitaGLIPtin (JANUVIA) 50 MG tablet Take 1 tablet (50 mg total) by mouth daily.  30 tablet  3   No current facility-administered medications on file prior to visit.   Allergies  Allergen Reactions  . Atorvastatin     Other reaction(s): Myalgias (intolerance)  . Fluzone  [Flu Virus Vaccine] Swelling  . Influenza Vaccines Other (See Comments)    Aches at site for over a year.  . Metformin Nausea Only  . Other     Other reaction(s): Swelling (ALLERGY/intolerance)   Immunization History  Administered Date(s) Administered  . Pneumococcal Polysaccharide-23 05/25/1994   Prior  to Admission medications   Medication Sig Start Date End Date Taking? Authorizing Provider  aspirin 81 MG tablet Take 81 mg by mouth daily.   Yes Historical Provider, MD  Cholecalciferol (VITAMIN D-1000 MAX ST) 1000 UNITS tablet Take 1,000 Units by mouth daily.   Yes Historical Provider, MD  Coenzyme Q10 (COQ-10) 200 MG CAPS 200 mg. Take 200 mg by mouth daily.   Yes Historical Provider, MD  fluticasone (FLONASE) 50 MCG/ACT nasal spray Place 2 sprays into both nostrils daily. 05/12/13  Yes Vernie Shanks, MD  glimepiride (AMARYL) 2 MG tablet Take 1 tablet by mouth twice daily with food 03/14/13  Yes Mae Loree Fee, FNP  glucose blood (ACCU-CHEK COMPACT PLUS) test strip Check blood sugar 1-2 times daily 02/14/13  Yes Mae Loree Fee, FNP  lisinopril (PRINIVIL,ZESTRIL) 10 MG tablet Take 1 tablet (10 mg total) by mouth daily. Take 10 mg by mouth daily. 02/14/13  Yes Mae Loree Fee, FNP  lovastatin (MEVACOR) 20 MG tablet Take 1  tablet (20 mg total) by mouth at bedtime. Take 1 tablet (20 mg total) by mouth nightly. 02/14/13 02/14/14 Yes Mae Loree Fee, FNP  metoprolol (LOPRESSOR) 50 MG tablet Take 0.5 tablets (25 mg total) by mouth 2 (two) times daily. 05/12/13  Yes Vernie Shanks, MD  sitaGLIPtin (JANUVIA) 50 MG tablet Take 1 tablet (50 mg total) by mouth daily. 05/14/13   Vernie Shanks, MD     ROS: As above in the HPI. All other systems are stable or negative.  OBJECTIVE: APPEARANCE:  Patient in no acute distress.The patient appeared well nourished and normally developed. Acyanotic. Waist: VITAL SIGNS:BP 124/77  Pulse 75  Temp(Src) 97.8 F (36.6 C) (Oral)  Ht 5\' 1"  (1.549 m)  Wt 134 lb 3.2 oz (60.873 kg)  BMI 25.37 kg/m2  AAF Elderly  SKIN: warm and  Dry without overt rashes, tattoos and scars  HEAD and Neck: without JVD, Head and scalp: normal Eyes:No scleral icterus. Fundi normal, eye movements normal. Ears: Auricle normal, canal normal, Tympanic membranes normal, insufflation normal. Nose: normal Throat: normal Neck & thyroid: normal  CHEST & LUNGS: Chest wall: normal Lungs: Clear  CVS: Reveals the PMI to be normally located. Regular rhythm, First and Second Heart sounds are normal,  absence of murmurs, rubs or gallops. Peripheral vasculature: Radial pulses: normal Dorsal pedis pulses: normal Posterior pulses: normal pacer  ABDOMEN:  Appearance: normal Benign, no organomegaly, no masses, no Abdominal Aortic enlargement. No Guarding , no rebound. No Bruits. Bowel sounds: normal  RECTAL: N/A GU: N/A   MUSCULOSKELETAL:  Spine: normal Joints: left ankle has soft tissue swelling around the medial malleolus and exquisite tenderness of the medial malleolus and the adjacent soft tissue.   NEUROLOGIC: oriented to time,place and person; nonfocal. Strength is normal Sensory is normal Reflexes are normal Cranial Nerves are normal.  ASSESSMENT:  Swelling of ankle - Plan: DG  Ankle Complete Left  Arthritis - Plan: POCT CBC, Uric acid, Sedimentation rate, meloxicam (MOBIC) 15 MG tablet  Stress-induced cardiomyopathy  Pacemaker  Hypertension  Hyperlipidemia  Cancer  Diabetes mellitus without complication  Osteoporosis  Glaucoma  PLAN:  Orders Placed This Encounter  Procedures  . DG Ankle Complete Left    Standing Status: Future     Number of Occurrences: 1     Standing Expiration Date: 10/11/2014    Order Specific Question:  Reason for Exam (SYMPTOM  OR DIAGNOSIS REQUIRED)    Answer:  swelling    Order Specific  Question:  Preferred imaging location?    Answer:  Internal  . Uric acid  . Sedimentation rate  . POCT CBC  [facility] reading (PRIMARY) by  Dr. Jacelyn Grip: foreign body in the heel. Incidental finding.no acute finding of the ankle joint. Results for orders placed in visit on 08/11/13  POCT CBC      Result Value Ref Range   WBC 5.5  4.6 - 10.2 K/uL   Lymph, poc 2.4  0.6 - 3.4   POC LYMPH PERCENT 43.3  10 - 50 %L   MID (cbc)    0 - 0.9   POC MID %    0 - 12 %M   POC Granulocyte 2.7  2 - 6.9   Granulocyte percent 48.3  37 - 80 %G   RBC 4.1  4.04 - 5.48 M/uL   Hemoglobin 12.2  12.2 - 16.2 g/dL   HCT, POC 38.5  37.7 - 47.9 %   MCV 94.9  80 - 97 fL   MCH, POC 30.1  27 - 31.2 pg   MCHC 31.7 (*) 31.8 - 35.4 g/dL   RDW, POC 14.7     Platelet Count, POC 185.0  142 - 424 K/uL   MPV 7.7  0 - 99.8 fL   Await rest of the labs. Suspect gout.                                   Meds ordered this encounter  Medications  . meloxicam (MOBIC) 15 MG tablet    Sig: Take 1 tablet (15 mg total) by mouth daily.    Dispense:  30 tablet    Refill:  0   There are no discontinued medications. Return in about 2 weeks (around 08/25/2013) for Recheck medical problems.  Ednah Hammock P. Jacelyn Grip, M.D.

## 2013-08-11 NOTE — Telephone Encounter (Signed)
APPT WITH [facility]

## 2013-08-12 LAB — URIC ACID: Uric Acid: 4.6 mg/dL (ref 2.5–7.1)

## 2013-08-12 LAB — SEDIMENTATION RATE: Sed Rate: 10 mm/hr (ref 0–40)

## 2013-08-12 NOTE — Progress Notes (Signed)
Quick Note:  Call patient. Labs normal. No change in plan. ______ 

## 2013-08-14 ENCOUNTER — Telehealth: Payer: Self-pay | Admitting: Family Medicine

## 2013-08-14 NOTE — Telephone Encounter (Signed)
Message copied by Waverly Ferrari on Mon Aug 14, 2013 11:16 AM ------      Message from: Vernie Shanks      Created: Sat Aug 12, 2013  8:40 PM       Call patient.      Labs normal.      No change in plan. ------

## 2013-09-04 ENCOUNTER — Ambulatory Visit (INDEPENDENT_AMBULATORY_CARE_PROVIDER_SITE_OTHER): Payer: Medicare Other | Admitting: Family Medicine

## 2013-09-04 ENCOUNTER — Encounter: Payer: Self-pay | Admitting: Family Medicine

## 2013-09-04 VITALS — BP 139/83 | HR 73 | Temp 97.6°F | Ht 61.0 in | Wt 133.4 lb

## 2013-09-04 DIAGNOSIS — E785 Hyperlipidemia, unspecified: Secondary | ICD-10-CM

## 2013-09-04 DIAGNOSIS — E119 Type 2 diabetes mellitus without complications: Secondary | ICD-10-CM

## 2013-09-04 DIAGNOSIS — Z950 Presence of cardiac pacemaker: Secondary | ICD-10-CM

## 2013-09-04 DIAGNOSIS — I10 Essential (primary) hypertension: Secondary | ICD-10-CM

## 2013-09-04 DIAGNOSIS — M810 Age-related osteoporosis without current pathological fracture: Secondary | ICD-10-CM

## 2013-09-04 DIAGNOSIS — C801 Malignant (primary) neoplasm, unspecified: Secondary | ICD-10-CM

## 2013-09-04 DIAGNOSIS — I5181 Takotsubo syndrome: Secondary | ICD-10-CM

## 2013-09-04 DIAGNOSIS — H409 Unspecified glaucoma: Secondary | ICD-10-CM

## 2013-09-04 LAB — POCT GLYCOSYLATED HEMOGLOBIN (HGB A1C): Hemoglobin A1C: 6.8

## 2013-09-04 NOTE — Progress Notes (Signed)
Patient ID: Kathryn Henson, female   DOB: July 07, 1922, 80 y.o.   MRN: [id number] SUBJECTIVE: CC: Chief Complaint  Patient presents with  . Follow-up    2 week ck up states ankle alot better    HPI: Here for  Recheck of her ankle and foot pain which is better with the meloxicam.symptoms resolved.  Patient is here for follow up of Diabetes Mellitus/HTN/HLD: Symptoms evaluated: Denies Nocturia ,Denies Urinary Frequency , denies Blurred vision ,deniesDizziness,denies.Dysuria,denies paresthesias, denies extremity pain or ulcers.Marland Kitchendenies chest pain. has had an annual eye exam. do check the feet. Does check CBGs. Average ERD:EYCXKG. Denies episodes of hypoglycemia. Does have an emergency hypoglycemic plan. admits toCompliance with medications. Denies Problems with medications.  Past Medical History  Diagnosis Date  . Pacemaker   . Diabetes mellitus without complication   . Glaucoma   . Hyperlipidemia   . Hypertension   . Cancer     left breast  . Osteoporosis    Past Surgical History  Procedure Laterality Date  . Pacemaker insertion    . Lumpectomy left breast     History   Social History  . Marital Status: Married    Spouse Name: N/A    Number of Children: N/A  . Years of Education: N/A   Occupational History  . Not on file.   Social History Main Topics  . Smoking status: Former Smoker    Types: Cigarettes    Quit date: 05/25/1968  . Smokeless tobacco: Not on file  . Alcohol Use: No  . Drug Use: No  . Sexual Activity: Not on file   Other Topics Concern  . Not on file   Social History Narrative  . No narrative on file   Family History  Problem Relation Age of Onset  . Heart disease Mother   . Diabetes Mother   . Stroke Father    Current Outpatient Prescriptions on File Prior to Visit  Medication Sig Dispense Refill  . aspirin 81 MG tablet Take 81 mg by mouth daily.      . Cholecalciferol (VITAMIN D-1000 MAX ST) 1000 UNITS tablet Take 1,000 Units by  mouth daily.      . Coenzyme Q10 (COQ-10) 200 MG CAPS 200 mg. Take 200 mg by mouth daily.      . fluticasone (FLONASE) 50 MCG/ACT nasal spray Place 2 sprays into both nostrils daily.  16 g  3  . glimepiride (AMARYL) 2 MG tablet Take 1 tablet by mouth twice daily with food  60 tablet  3  . glucose blood (ACCU-CHEK COMPACT PLUS) test strip Check blood sugar 1-2 times daily  100 each  3  . lisinopril (PRINIVIL,ZESTRIL) 10 MG tablet Take 1 tablet (10 mg total) by mouth daily. Take 10 mg by mouth daily.  30 tablet  3  . lovastatin (MEVACOR) 20 MG tablet Take 1 tablet (20 mg total) by mouth at bedtime. Take 1 tablet (20 mg total) by mouth nightly.  39 tablet  3  . meloxicam (MOBIC) 15 MG tablet Take 1 tablet (15 mg total) by mouth daily.  30 tablet  0  . metoprolol (LOPRESSOR) 50 MG tablet Take 0.5 tablets (25 mg total) by mouth 2 (two) times daily.  90 tablet  3  . sitaGLIPtin (JANUVIA) 50 MG tablet Take 1 tablet (50 mg total) by mouth daily.  30 tablet  3   No current facility-administered medications on file prior to visit.   Allergies  Allergen Reactions  . Atorvastatin  Other reaction(s): Myalgias (intolerance)  . Fluzone  [Flu Virus Vaccine] Swelling  . Influenza Vaccines Other (See Comments)    Aches at site for over a year.  . Metformin Nausea Only  . Other     Other reaction(s): Swelling (ALLERGY/intolerance)   Immunization History  Administered Date(s) Administered  . Pneumococcal Polysaccharide-23 05/25/1994  . Td 11/22/2009   Prior to Admission medications   Medication Sig Start Date End Date Taking? Authorizing Provider  aspirin 81 MG tablet Take 81 mg by mouth daily.   Yes Historical Provider, MD  Cholecalciferol (VITAMIN D-1000 MAX ST) 1000 UNITS tablet Take 1,000 Units by mouth daily.   Yes Historical Provider, MD  Coenzyme Q10 (COQ-10) 200 MG CAPS 200 mg. Take 200 mg by mouth daily.   Yes Historical Provider, MD  fluticasone (FLONASE) 50 MCG/ACT nasal spray Place 2  sprays into both nostrils daily. 05/12/13  Yes Vernie Shanks, MD  glimepiride (AMARYL) 2 MG tablet Take 1 tablet by mouth twice daily with food 03/14/13  Yes Mae Loree Fee, FNP  glucose blood (ACCU-CHEK COMPACT PLUS) test strip Check blood sugar 1-2 times daily 02/14/13  Yes Mae Loree Fee, FNP  lisinopril (PRINIVIL,ZESTRIL) 10 MG tablet Take 1 tablet (10 mg total) by mouth daily. Take 10 mg by mouth daily. 02/14/13  Yes Mae Loree Fee, FNP  lovastatin (MEVACOR) 20 MG tablet Take 1 tablet (20 mg total) by mouth at bedtime. Take 1 tablet (20 mg total) by mouth nightly. 02/14/13 02/14/14 Yes Mae Loree Fee, FNP  meloxicam (MOBIC) 15 MG tablet Take 1 tablet (15 mg total) by mouth daily. 08/11/13  Yes Vernie Shanks, MD  metoprolol (LOPRESSOR) 50 MG tablet Take 0.5 tablets (25 mg total) by mouth 2 (two) times daily. 05/12/13  Yes Vernie Shanks, MD  sitaGLIPtin (JANUVIA) 50 MG tablet Take 1 tablet (50 mg total) by mouth daily. 05/14/13  Yes Vernie Shanks, MD     ROS: As above in the HPI. All other systems are stable or negative.  OBJECTIVE: APPEARANCE:  Patient in no acute distress.The patient appeared well nourished and normally developed. Acyanotic. Waist: VITAL SIGNS:BP 139/83  Pulse 73  Temp(Src) 97.6 F (36.4 C) (Oral)  Ht _0  (1.549 m)  Wt 133 lb 6.4 oz (60.51 kg)  BMI 25.22 kg/m2 AAF  SKIN: warm and  Dry without overt rashes, tattoos and scars  HEAD and Neck: without JVD, Head and scalp: normal Eyes:No scleral icterus. Fundi normal, eye movements normal. Ears: Auricle normal, canal normal, Tympanic membranes normal, insufflation normal. Nose: normal Throat: normal Neck & thyroid: normal  CHEST & LUNGS: Chest wall: normal Lungs: Clear  CVS: Reveals the PMI to be normally located. Regular rhythm, First and Second Heart sounds are normal,  absence of murmurs, rubs or gallops. Peripheral vasculature: Radial pulses: normal Dorsal pedis pulses: normal Posterior  pulses: normal  ABDOMEN:  Appearance: normal Benign, no organomegaly, no masses, no Abdominal Aortic enlargement. No Guarding , no rebound. No Bruits. Bowel sounds: normal  RECTAL: N/A GU: N/A  EXTREMETIES: nonedematous.  MUSCULOSKELETAL:  Spine: normal Joints: intact  NEUROLOGIC: oriented to time,place and person; nonfocal. Strength is normal Sensory is normal Reflexes are normal Cranial Nerves are normal.  Foot exam monfilament exam intact. Results for orders placed in visit on 08/11/13  URIC ACID      Result Value Ref Range   Uric Acid 4.6  2.5 - 7.1 mg/dL  SEDIMENTATION RATE      Result Value  Ref Range   Sed Rate 10  0 - 40 mm/hr  POCT CBC      Result Value Ref Range   WBC 5.5  4.6 - 10.2 K/uL   Lymph, poc 2.4  0.6 - 3.4   POC LYMPH PERCENT 43.3  10 - 50 %L   MID (cbc)    0 - 0.9   POC MID %    0 - 12 %M   POC Granulocyte 2.7  2 - 6.9   Granulocyte percent 48.3  37 - 80 %G   RBC 4.1  4.04 - 5.48 M/uL   Hemoglobin 12.2  12.2 - 16.2 g/dL   HCT, POC 38.5  37.7 - 47.9 %   MCV 94.9  80 - 97 fL   MCH, POC 30.1  27 - 31.2 pg   MCHC 31.7 (*) 31.8 - 35.4 g/dL   RDW, POC 14.7     Platelet Count, POC 185.0  142 - 424 K/uL   MPV 7.7  0 - 99.8 fL    ASSESSMENT: Stress-induced cardiomyopathy  Hypertension - Plan: CMP14+EGFR  Hyperlipidemia - Plan: NMR, lipoprofile  Diabetes mellitus without complication - Plan: POCT glycosylated hemoglobin (Hb A1C)  Cancer  Pacemaker  Osteoporosis  Glaucoma  PLAN:      Dr Paula Libra Recommendations  For nutrition information, I recommend books:  1).Eat to Live by Dr Excell Seltzer. 2).Prevent and Reverse Heart Disease by Dr Karl Luke. 3) Dr Janene Harvey Book:  Program to Reverse Diabetes  Exercise recommendations are:  If unable to walk, then the patient can exercise in a chair 3 times a day. By flapping arms like a bird gently and raising legs outwards to the front.  If ambulatory, the patient can  go for walks for 30 minutes 3 times a week. Then increase the intensity and duration as tolerated.  Goal is to try to attain exercise frequency to 5 times a week.  If applicable: Best to perform resistance exercises (machines or weights) 2 days a week and cardio type exercises 3 days per week.  Orders Placed This Encounter  Procedures  . CMP14+EGFR  . NMR, lipoprofile  . POCT glycosylated hemoglobin (Hb A1C)   No orders of the defined types were placed in this encounter.   There are no discontinued medications. Return in about 3 months (around 12/04/2013) for Recheck medical problems.  Troyce Gieske P. Jacelyn Grip, M.D.

## 2013-09-04 NOTE — Patient Instructions (Signed)
      Dr Anasophia Pecor's Recommendations  For nutrition information, I recommend books:  1).Eat to Live by Dr Joel Fuhrman. 2).Prevent and Reverse Heart Disease by Dr Caldwell Esselstyn. 3) Dr Neal Barnard's Book:  Program to Reverse Diabetes  Exercise recommendations are:  If unable to walk, then the patient can exercise in a chair 3 times a day. By flapping arms like a bird gently and raising legs outwards to the front.  If ambulatory, the patient can go for walks for 30 minutes 3 times a week. Then increase the intensity and duration as tolerated.  Goal is to try to attain exercise frequency to 5 times a week.  If applicable: Best to perform resistance exercises (machines or weights) 2 days a week and cardio type exercises 3 days per week.  

## 2013-09-05 LAB — CMP14+EGFR
ALT: 12 IU/L (ref 0–32)
AST: 15 IU/L (ref 0–40)
Albumin/Globulin Ratio: 1.6 (ref 1.1–2.5)
Albumin: 4 g/dL (ref 3.5–4.7)
Alkaline Phosphatase: 91 IU/L (ref 39–117)
BUN/Creatinine Ratio: 26 (ref 11–26)
BUN: 24 mg/dL (ref 8–27)
CO2: 25 mmol/L (ref 18–29)
Calcium: 9.5 mg/dL (ref 8.7–10.3)
Chloride: 105 mmol/L (ref 97–108)
Creatinine, Ser: 0.92 mg/dL (ref 0.57–1.00)
GFR calc Af Amer: 64 mL/min/{1.73_m2} (ref >59)
GFR calc non Af Amer: 56 mL/min/{1.73_m2} — ABNORMAL LOW (ref >59)
Globulin, Total: 2.5 g/dL (ref 1.5–4.5)
Glucose: 76 mg/dL (ref 65–99)
Potassium: 4.5 mmol/L (ref 3.5–5.2)
Sodium: 145 mmol/L — ABNORMAL HIGH (ref 134–144)
Total Bilirubin: 0.4 mg/dL (ref 0.0–1.2)
Total Protein: 6.5 g/dL (ref 6.0–8.5)

## 2013-09-05 LAB — NMR, LIPOPROFILE
Cholesterol: 244 mg/dL — ABNORMAL HIGH (ref <200)
HDL Cholesterol by NMR: 71 mg/dL (ref >=40)
HDL Particle Number: 34.7 umol/L (ref >=30.5)
LDL Particle Number: 1431 nmol/L — ABNORMAL HIGH (ref <1000)
LDL Size: 21.3 nm (ref >20.5)
LDLC SERPL CALC-MCNC: 159 mg/dL — ABNORMAL HIGH (ref <100)
LP-IR Score: <25 (ref <=45)
Small LDL Particle Number: 225 nmol/L (ref <=527)
Triglycerides by NMR: 69 mg/dL (ref <150)

## 2013-09-07 ENCOUNTER — Other Ambulatory Visit: Payer: Self-pay | Admitting: General Practice

## 2013-09-07 ENCOUNTER — Encounter: Payer: Self-pay | Admitting: General Practice

## 2013-09-08 ENCOUNTER — Other Ambulatory Visit: Payer: Self-pay | Admitting: *Deleted

## 2013-09-08 ENCOUNTER — Other Ambulatory Visit: Payer: Self-pay | Admitting: General Practice

## 2013-09-08 DIAGNOSIS — I10 Essential (primary) hypertension: Secondary | ICD-10-CM

## 2013-09-08 MED ORDER — LISINOPRIL 10 MG PO TABS: 10.0000 mg | ORAL_TABLET | Freq: Every day | ORAL | Status: DC

## 2013-09-10 ENCOUNTER — Other Ambulatory Visit: Payer: Self-pay | Admitting: Family Medicine

## 2013-09-10 DIAGNOSIS — Z950 Presence of cardiac pacemaker: Secondary | ICD-10-CM

## 2013-09-10 DIAGNOSIS — I5181 Takotsubo syndrome: Secondary | ICD-10-CM

## 2013-09-10 DIAGNOSIS — E119 Type 2 diabetes mellitus without complications: Secondary | ICD-10-CM

## 2013-09-10 DIAGNOSIS — E785 Hyperlipidemia, unspecified: Secondary | ICD-10-CM

## 2013-09-10 DIAGNOSIS — I10 Essential (primary) hypertension: Secondary | ICD-10-CM

## 2013-09-10 MED ORDER — LOVASTATIN 40 MG PO TABS: 40.0000 mg | ORAL_TABLET | Freq: Every day | ORAL | Status: DC

## 2013-09-22 ENCOUNTER — Other Ambulatory Visit: Payer: Self-pay | Admitting: General Practice

## 2013-09-22 DIAGNOSIS — I10 Essential (primary) hypertension: Secondary | ICD-10-CM

## 2013-09-22 MED ORDER — LISINOPRIL 10 MG PO TABS: 10.0000 mg | ORAL_TABLET | Freq: Every day | ORAL | Status: DC

## 2013-11-07 ENCOUNTER — Telehealth: Payer: Self-pay | Admitting: General Practice

## 2013-11-07 DIAGNOSIS — I10 Essential (primary) hypertension: Secondary | ICD-10-CM

## 2013-11-07 MED ORDER — LISINOPRIL 10 MG PO TABS: 10.0000 mg | ORAL_TABLET | Freq: Every day | ORAL | Status: DC

## 2013-11-07 NOTE — Telephone Encounter (Signed)
done

## 2014-02-27 ENCOUNTER — Other Ambulatory Visit: Payer: Self-pay | Admitting: General Practice

## 2014-03-15 ENCOUNTER — Encounter: Payer: Self-pay | Admitting: Family Medicine

## 2014-03-15 ENCOUNTER — Ambulatory Visit (INDEPENDENT_AMBULATORY_CARE_PROVIDER_SITE_OTHER): Payer: Medicare Other | Admitting: Family Medicine

## 2014-03-15 VITALS — BP 125/73 | HR 66 | Temp 97.0°F | Ht 61.0 in | Wt 135.0 lb

## 2014-03-15 DIAGNOSIS — M810 Age-related osteoporosis without current pathological fracture: Secondary | ICD-10-CM

## 2014-03-15 DIAGNOSIS — M25511 Pain in right shoulder: Secondary | ICD-10-CM

## 2014-03-15 DIAGNOSIS — E119 Type 2 diabetes mellitus without complications: Secondary | ICD-10-CM

## 2014-03-15 LAB — POCT GLYCOSYLATED HEMOGLOBIN (HGB A1C)

## 2014-03-15 NOTE — Progress Notes (Signed)
   Subjective:    Patient ID: Kathryn Henson, female    DOB: 09-02-1922, 80 y.o.   MRN: [id number]  HPI delightful 80 year old female who is here to followup her diabetes hypertension, and lipids. She had lipid panel done 6 months ago and numbers were satisfactory. She also had an A1c done 6 months ago and she was at goal at 6.8. Today she is requesting diabetic shoes and her walker. She also complains of right shoulder pain. There is no history of a fall or other injury. She sleeps with a heating pad on her shoulder at night.    Review of Systems  Constitutional: Negative.   HENT: Negative.   Respiratory: Negative.   Cardiovascular: Negative.   Gastrointestinal: Negative.   Genitourinary: Negative.   Musculoskeletal: Positive for arthralgias.       Objective:   Physical Exam  Constitutional: She is oriented to person, place, and time. She appears well-developed and well-nourished.  HENT:  Head: Normocephalic.  Eyes: Pupils are equal, round, and reactive to light.  Neck: Normal range of motion. Neck supple.  Cardiovascular: Normal rate, regular rhythm and normal heart sounds.   Pulmonary/Chest: Effort normal and breath sounds normal.  Abdominal: Soft.  Musculoskeletal:  R shoulder decr abduction; pain over lat aspect, especially deltoid  Neurological: She is alert and oriented to person, place, and time. She has normal reflexes.  Psychiatric: She has a normal mood and affect. Her behavior is normal. Thought content normal.   BP 125/73  Pulse 66  Temp(Src) 97 F (36.1 C) (Oral)  Ht $R'5\' 1"'FG$  (1.549 m)  Wt 135 lb (61.236 kg)  BMI 25.52 kg/m2       Assessment & Plan:  1. Diabetes mellitus without complication Taking only Amaryl - POCT glycosylated hemoglobin (Hb A1C) - BMP8+EGFR  2. Osteoporosis   3. Right shoulder pain Suspect tendonitis; injected with 20 mg Depo Medrol and marcaine with instructions for gentle ROM

## 2014-03-15 NOTE — Patient Instructions (Signed)

## 2014-03-16 ENCOUNTER — Telehealth: Payer: Self-pay | Admitting: Family Medicine

## 2014-03-16 LAB — BMP8+EGFR
BUN/Creatinine Ratio: 31 — ABNORMAL HIGH (ref 11–26)
BUN: 28 mg/dL — AB (ref 8–27)
CALCIUM: 9.2 mg/dL (ref 8.7–10.3)
CHLORIDE: 103 mmol/L (ref 97–108)
CO2: 25 mmol/L (ref 18–29)
CREATININE: 0.89 mg/dL (ref 0.57–1.00)
GFR calc Af Amer: 66 mL/min/{1.73_m2} (ref >59)
GFR calc non Af Amer: 58 mL/min/{1.73_m2} — ABNORMAL LOW (ref >59)
GLUCOSE: 73 mg/dL (ref 65–99)
Potassium: 3.9 mmol/L (ref 3.5–5.2)
Sodium: 142 mmol/L (ref 134–144)

## 2014-03-16 NOTE — Progress Notes (Signed)
Patient ID: Kathryn Henson, female   DOB: Jan 25, 1923, 80 y.o.   MRN: [id number] S: Patient is type II diabetic and is requesting aids for ambulation today.  O: Exam of her feet shows no open wounds but she does have callus under the right great toe. Sensation appears to be intact with monofilament and pulses are barely palpable bilaterally. Regarding need for a walker she states that she is unsteady on her feet. She has not had falls but the purpose and obtaining a walker is to try and prevent falls and more serious injury.  A,P: I would support need for ambulatory aids such as diabetic shoes and walker. Face-to-face exam was done on 1022 2 support this request  Wardell Honour MD

## 2014-03-16 NOTE — Addendum Note (Signed)
Addended by: Marin Olp on: 03/16/2014 09:39 AM   Modules accepted: Orders

## 2014-03-19 NOTE — Telephone Encounter (Signed)
Zigmund Daniel and I did the form I believe; it takes more than a Rx

## 2014-03-29 ENCOUNTER — Telehealth: Payer: Self-pay | Admitting: Family Medicine

## 2014-03-30 NOTE — Telephone Encounter (Signed)
Rxs up front. Caregiver notified

## 2014-03-30 NOTE — Telephone Encounter (Signed)
This has already been taking care of patient forms and Rx are up front for them to pickup.

## 2014-04-01 ENCOUNTER — Other Ambulatory Visit: Payer: Self-pay | Admitting: Nurse Practitioner

## 2014-04-02 ENCOUNTER — Other Ambulatory Visit: Payer: Self-pay

## 2014-04-02 MED ORDER — GLIMEPIRIDE 2 MG PO TABS: ORAL_TABLET | ORAL | Status: DC

## 2014-04-09 LAB — HM DIABETES EYE EXAM

## 2014-04-20 ENCOUNTER — Encounter: Payer: Self-pay | Admitting: Family Medicine

## 2014-04-20 ENCOUNTER — Ambulatory Visit (INDEPENDENT_AMBULATORY_CARE_PROVIDER_SITE_OTHER): Payer: Medicare Other

## 2014-04-20 ENCOUNTER — Ambulatory Visit (INDEPENDENT_AMBULATORY_CARE_PROVIDER_SITE_OTHER): Payer: Medicare Other | Admitting: Family Medicine

## 2014-04-20 VITALS — BP 131/77 | HR 79 | Temp 97.4°F | Ht 61.0 in | Wt 137.4 lb

## 2014-04-20 DIAGNOSIS — M25511 Pain in right shoulder: Secondary | ICD-10-CM

## 2014-04-20 MED ORDER — GLUCOSE BLOOD VI STRP
ORAL_STRIP | Status: DC
Start: 2014-04-20 — End: 2014-09-26

## 2014-04-20 MED ORDER — NITROGLYCERIN 0.1 MG/HR TD PT24: 0.1000 mg | MEDICATED_PATCH | Freq: Every day | TRANSDERMAL | Status: DC

## 2014-04-20 NOTE — Progress Notes (Signed)
   Subjective:    Patient ID: Kathryn Henson, female    DOB: 1923-04-06, 80 y.o.   MRN: [id number]  HPI this is a follow-up visit for right shoulder pain. I injected the shoulder about one month ago but that did not help.    Review of Systems  Musculoskeletal: Positive for arthralgias.       Right shoulder pain worse with most any movement       Objective:   Physical Exam  Musculoskeletal:  Right shoulder: Difficult to really evaluate because she guards secondary to pain. Does not seem to be as tender laterally over bicipital or deltoid tendon. There is decreased strength with internal rotation.   BP 131/77 mmHg  Pulse 79  Temp(Src) 97.4 F (36.3 C) (Oral)  Ht 5\' 1"  (1.549 m)  Wt 137 lb 6.4 oz (62.324 kg)  BMI 25.97 kg/m2       Assessment & Plan:  1. Pain in joint, shoulder region, right X-ray looks great for her age. Would consider soft tissue etiology such as rotator cuff but I do not think she did be a candidate for surgery. We'll try conservative approach with physical therapy and nitroglycerin patch over affected area.  Wardell Honour MD - DG Shoulder Right; Future - Ambulatory referral to Physical Therapy

## 2014-04-20 NOTE — Progress Notes (Signed)
   Subjective:    Patient ID: Kathryn Henson, female    DOB: November 27, 1922, 80 y.o.   MRN: [id number]  HPI    Review of Systems     Objective:   Physical Exam  BP 131/77 mmHg  Pulse 79  Temp(Src) 97.4 F (36.3 C) (Oral)  Ht 5\' 1"  (1.549 m)  Wt 137 lb 6.4 oz (62.324 kg)  BMI 25.97 kg/m2      Assessment & Plan:

## 2014-04-20 NOTE — Addendum Note (Signed)
Addended by: Marin Olp on: 04/20/2014 11:50 AM   Modules accepted: Orders, SmartSet

## 2014-05-08 ENCOUNTER — Ambulatory Visit: Payer: Medicare Other | Attending: Family Medicine | Admitting: Physical Therapy

## 2014-05-08 DIAGNOSIS — M25611 Stiffness of right shoulder, not elsewhere classified: Secondary | ICD-10-CM | POA: Diagnosis not present

## 2014-05-08 DIAGNOSIS — M25511 Pain in right shoulder: Secondary | ICD-10-CM | POA: Insufficient documentation

## 2014-05-11 ENCOUNTER — Ambulatory Visit: Payer: Medicare Other | Admitting: Physical Therapy

## 2014-05-11 DIAGNOSIS — M25511 Pain in right shoulder: Secondary | ICD-10-CM | POA: Diagnosis not present

## 2014-05-12 ENCOUNTER — Other Ambulatory Visit: Payer: Self-pay | Admitting: Family Medicine

## 2014-05-12 ENCOUNTER — Other Ambulatory Visit: Payer: Self-pay | Admitting: *Deleted

## 2014-05-12 DIAGNOSIS — Z950 Presence of cardiac pacemaker: Secondary | ICD-10-CM

## 2014-05-12 MED ORDER — METOPROLOL TARTRATE 50 MG PO TABS: 25.0000 mg | ORAL_TABLET | Freq: Two times a day (BID) | ORAL | Status: DC

## 2014-05-14 NOTE — Telephone Encounter (Signed)
Metoprolol sent to [facility].

## 2014-05-15 ENCOUNTER — Ambulatory Visit: Payer: Medicare Other | Admitting: Physical Therapy

## 2014-05-15 DIAGNOSIS — M25511 Pain in right shoulder: Secondary | ICD-10-CM | POA: Diagnosis not present

## 2014-05-22 ENCOUNTER — Ambulatory Visit: Payer: Medicare Other | Admitting: Physical Therapy

## 2014-05-22 DIAGNOSIS — M25511 Pain in right shoulder: Secondary | ICD-10-CM | POA: Diagnosis not present

## 2014-05-29 ENCOUNTER — Ambulatory Visit: Payer: Medicare Other | Attending: Family Medicine | Admitting: Physical Therapy

## 2014-05-29 DIAGNOSIS — M25611 Stiffness of right shoulder, not elsewhere classified: Secondary | ICD-10-CM | POA: Diagnosis not present

## 2014-05-29 DIAGNOSIS — M25511 Pain in right shoulder: Secondary | ICD-10-CM | POA: Insufficient documentation

## 2014-05-31 ENCOUNTER — Encounter: Payer: Medicare Other | Admitting: Physical Therapy

## 2014-05-31 ENCOUNTER — Other Ambulatory Visit: Payer: Self-pay | Admitting: Family Medicine

## 2014-06-04 ENCOUNTER — Ambulatory Visit: Payer: Medicare Other | Admitting: Physical Therapy

## 2014-06-04 DIAGNOSIS — M25511 Pain in right shoulder: Secondary | ICD-10-CM | POA: Diagnosis not present

## 2014-06-04 LAB — HM DIABETES EYE EXAM

## 2014-06-06 ENCOUNTER — Encounter: Payer: Self-pay | Admitting: *Deleted

## 2014-06-07 ENCOUNTER — Encounter: Payer: Medicare Other | Admitting: Physical Therapy

## 2014-06-12 ENCOUNTER — Encounter: Payer: Medicare Other | Admitting: Physical Therapy

## 2014-06-19 ENCOUNTER — Encounter: Payer: Medicare Other | Admitting: Physical Therapy

## 2014-06-21 ENCOUNTER — Other Ambulatory Visit: Payer: Self-pay

## 2014-06-21 ENCOUNTER — Ambulatory Visit: Payer: Medicare Other | Admitting: Physical Therapy

## 2014-06-21 DIAGNOSIS — M25511 Pain in right shoulder: Secondary | ICD-10-CM | POA: Diagnosis not present

## 2014-06-21 DIAGNOSIS — Z1231 Encounter for screening mammogram for malignant neoplasm of breast: Secondary | ICD-10-CM

## 2014-06-27 ENCOUNTER — Ambulatory Visit
Admission: RE | Admit: 2014-06-27 | Discharge: 2014-06-27 | Disposition: A | Discharge status: Home / Self Care | Payer: Medicare Other | Source: Ambulatory Visit

## 2014-06-27 DIAGNOSIS — Z1231 Encounter for screening mammogram for malignant neoplasm of breast: Secondary | ICD-10-CM

## 2014-07-16 ENCOUNTER — Telehealth: Payer: Self-pay | Admitting: Family Medicine

## 2014-07-16 MED ORDER — GLIMEPIRIDE 2 MG PO TABS: 2.0000 mg | ORAL_TABLET | Freq: Two times a day (BID) | ORAL | Status: DC

## 2014-07-16 NOTE — Telephone Encounter (Signed)
done

## 2014-07-30 ENCOUNTER — Telehealth: Payer: Self-pay | Admitting: Family Medicine

## 2014-07-30 NOTE — Telephone Encounter (Signed)
Patients daughter stated that she had drank a lot of gatorade this weekend and ate popsicles where she had a stomach virus and her BS shot up to 300 and she also missed a dose of her medications. Patients daughter states her BS now is 199 and I advised to watch her BS and if it does not continue to come down to call us back but that it was probably in the 300's where she was sick and had a overload of sugar and by missing a dose of her medications. Patients daughter states that she will continue to monitor her BS and if it does not improve she will call us back.

## 2014-09-26 ENCOUNTER — Ambulatory Visit (INDEPENDENT_AMBULATORY_CARE_PROVIDER_SITE_OTHER): Payer: Medicare Other | Admitting: Family Medicine

## 2014-09-26 ENCOUNTER — Encounter: Payer: Self-pay | Admitting: Family Medicine

## 2014-09-26 VITALS — BP 134/82 | HR 70 | Temp 98.4°F | Ht 61.0 in | Wt 131.6 lb

## 2014-09-26 DIAGNOSIS — E785 Hyperlipidemia, unspecified: Secondary | ICD-10-CM | POA: Diagnosis not present

## 2014-09-26 DIAGNOSIS — I10 Essential (primary) hypertension: Secondary | ICD-10-CM | POA: Diagnosis not present

## 2014-09-26 DIAGNOSIS — E119 Type 2 diabetes mellitus without complications: Secondary | ICD-10-CM

## 2014-09-26 LAB — POCT GLYCOSYLATED HEMOGLOBIN (HGB A1C): Hemoglobin A1C: 6.8

## 2014-09-26 MED ORDER — GLUCOSE BLOOD VI STRP
ORAL_STRIP | Status: DC
Start: 2014-09-26 — End: 2016-01-25

## 2014-09-26 NOTE — Progress Notes (Signed)
   Subjective:    Patient ID: Kathryn Henson, female    DOB: 12-01-1922, 81 y.o.   MRN: [id number]  HPI 81 year old African-American female here to follow-up diabetes, hypertension, hyperlipidemia. Her last visit centered around right shoulder pain and at that time some physical therapy and nitroglycerin patches were ordered and she reports today that her shoulder is much improved with minimal discomfort and good range of motion.  She monitors her sugar occasionally at home highest glucoses been 160 and Lucita Ferrara is been in the 90 range. She has lost about 5 pounds since her last visit last year. She is compliant with her medications and diet  Patient Active Problem List   Diagnosis Date Noted  . Arthritis 08/11/2013  . Swelling of ankle 08/11/2013  . Seborrheic keratoses, inflamed 05/29/2013  . Cellulitis 05/29/2013  . Allergic rhinitis 05/13/2013  . Other malaise and fatigue 05/13/2013  . Hyperlipemia 05/13/2013  . Diabetes 05/13/2013  . Cancer   . Osteoporosis   . Diabetes mellitus without complication   . Glaucoma   . Hyperlipidemia   . Hypertension   . Pacemaker   . Syncope and collapse 09/10/2012  . Stress-induced cardiomyopathy 11/17/2011   Outpatient Encounter Prescriptions as of 09/26/2014  Medication Sig  . aspirin 81 MG tablet Take 81 mg by mouth daily.  . Cholecalciferol (VITAMIN D-1000 MAX ST) 1000 UNITS tablet Take 1,000 Units by mouth daily.  . Coenzyme Q10 (COQ-10) 200 MG CAPS 200 mg. Take 200 mg by mouth daily.  . fluticasone (FLONASE) 50 MCG/ACT nasal spray Place 2 sprays into both nostrils daily.  Marland Kitchen glimepiride (AMARYL) 2 MG tablet Take 1 tablet (2 mg total) by mouth 2 (two) times daily with a meal.  . glucose blood (ACCU-CHEK COMPACT PLUS) test strip Check blood sugar 1-2 times daily  . lisinopril (PRINIVIL,ZESTRIL) 10 MG tablet TAKE ONE TABLET BY MOUTH ONCE DAILY  . metoprolol (LOPRESSOR) 50 MG tablet Take 0.5 tablets (25 mg total) by mouth 2 (two) times daily.  .  nitroGLYCERIN (MINITRAN) 0.1 mg/hr patch Place 1 patch (0.1 mg total) onto the skin daily. apply 1/3 patch to shoulder QD  . [DISCONTINUED] NAFTIN 2 % GEL   . lovastatin (MEVACOR) 40 MG tablet Take 1 tablet (40 mg total) by mouth at bedtime. Take 1 tablet (20 mg total) by mouth nightly.   No facility-administered encounter medications on file as of 09/26/2014.      Review of Systems  Constitutional: Positive for unexpected weight change.  HENT: Negative.   Respiratory: Negative.   Cardiovascular: Negative.   Gastrointestinal: Negative.   Neurological: Negative.   Psychiatric/Behavioral: Negative.        Objective:   Physical Exam  Constitutional: She appears well-developed and well-nourished.  Cardiovascular: Normal rate and regular rhythm.   Pulmonary/Chest: Effort normal.  Musculoskeletal: Normal range of motion.  Psychiatric: She has a normal mood and affect. Thought content normal.          Assessment & Plan:  1. Diabetes mellitus without complication Last Y6R was somewhat elevated but it has not been checked in some time. - Lipid panel - CMP14+EGFR - POCT glycosylated hemoglobin (Hb A1C)  2. Hyperlipidemia There are no side effects from lovastatin. Lipids were last checked about one year ago - Lipid panel - CMP14+EGFR  3. Essential hypertension Blood pressure well controlled with lisinopril. Will continue same dose - Lipid panel - CMP14+EGFR  Wardell Honour MD

## 2014-09-27 LAB — CMP14+EGFR
ALT: 10 IU/L (ref 0–32)
AST: 17 IU/L (ref 0–40)
Albumin/Globulin Ratio: 1.3 (ref 1.1–2.5)
Albumin: 3.9 g/dL (ref 3.5–4.7)
Alkaline Phosphatase: 89 IU/L (ref 39–117)
BILIRUBIN TOTAL: 0.4 mg/dL (ref 0.0–1.2)
BUN/Creatinine Ratio: 27 — ABNORMAL HIGH (ref 11–26)
BUN: 26 mg/dL (ref 8–27)
CO2: 25 mmol/L (ref 18–29)
CREATININE: 0.97 mg/dL (ref 0.57–1.00)
Calcium: 9.1 mg/dL (ref 8.7–10.3)
Chloride: 104 mmol/L (ref 97–108)
GFR, EST AFRICAN AMERICAN: 60 mL/min/{1.73_m2} (ref >59)
GFR, EST NON AFRICAN AMERICAN: 52 mL/min/{1.73_m2} — AB (ref >59)
Globulin, Total: 2.9 g/dL (ref 1.5–4.5)
Glucose: 101 mg/dL — ABNORMAL HIGH (ref 65–99)
Potassium: 4.5 mmol/L (ref 3.5–5.2)
Sodium: 143 mmol/L (ref 134–144)
Total Protein: 6.8 g/dL (ref 6.0–8.5)

## 2014-09-27 LAB — LIPID PANEL
Chol/HDL Ratio: 3.3 ratio units (ref 0.0–4.4)
Cholesterol, Total: 254 mg/dL — ABNORMAL HIGH (ref 100–199)
HDL: 78 mg/dL (ref >39)
LDL CALC: 163 mg/dL — AB (ref 0–99)
Triglycerides: 65 mg/dL (ref 0–149)
VLDL Cholesterol Cal: 13 mg/dL (ref 5–40)

## 2014-10-04 ENCOUNTER — Ambulatory Visit: Payer: Medicare Other | Admitting: *Deleted

## 2014-10-17 ENCOUNTER — Other Ambulatory Visit: Payer: Self-pay | Admitting: Nurse Practitioner

## 2014-10-17 ENCOUNTER — Ambulatory Visit (INDEPENDENT_AMBULATORY_CARE_PROVIDER_SITE_OTHER): Payer: Medicare Other | Admitting: *Deleted

## 2014-10-17 ENCOUNTER — Encounter: Payer: Self-pay | Admitting: *Deleted

## 2014-10-17 VITALS — BP 144/82 | HR 67 | Ht 60.0 in | Wt 131.0 lb

## 2014-10-17 DIAGNOSIS — H9193 Unspecified hearing loss, bilateral: Secondary | ICD-10-CM

## 2014-10-17 DIAGNOSIS — Z0000 Encounter for general adult medical examination without abnormal findings: Secondary | ICD-10-CM

## 2014-10-17 MED ORDER — GLIMEPIRIDE 2 MG PO TABS: 2.0000 mg | ORAL_TABLET | Freq: Two times a day (BID) | ORAL | Status: DC

## 2014-10-17 NOTE — Patient Instructions (Addendum)
Keep follow up with Dr Sabra Heck in 12/2013 Continue to exercise daily. May want to add walking to exercise routine. Continue to eat a balanced diet with lean proteins, fruits, and vegetables. Avoid over ripe fruits because they are higher in sugar.  Bring a copy of Advance Directives to place in chart. Move carefully to avoid falls. Throw rugs are a common cause for falls.   Health Maintenance Adopting a healthy lifestyle and getting preventive care can go a long way to promote health and wellness. Talk with your health care provider about what schedule of regular examinations is right for you. This is a good chance for you to check in with your provider about disease prevention and staying healthy. In between checkups, there are plenty of things you can do on your own. Experts have done a lot of research about which lifestyle changes and preventive measures are most likely to keep you healthy. Ask your health care provider for more information. WEIGHT AND DIET  Eat a healthy diet  Be sure to include plenty of vegetables, fruits, low-fat dairy products, and lean protein.  Do not eat a lot of foods high in solid fats, added sugars, or salt.  Get regular exercise. This is one of the most important things you can do for your health.  Most adults should exercise for at least 150 minutes each week. The exercise should increase your heart rate and make you sweat (moderate-intensity exercise).  Most adults should also do strengthening exercises at least twice a week. This is in addition to the moderate-intensity exercise.  Maintain a healthy weight  Body mass index (BMI) is a measurement that can be used to identify possible weight problems. It estimates body fat based on height and weight. Your health care provider can help determine your BMI and help you achieve or maintain a healthy weight.  For females 47 years of age and older:   A BMI below 18.5 is considered underweight.  A BMI of 18.5 to  24.9 is normal.  A BMI of 25 to 29.9 is considered overweight.  A BMI of 30 and above is considered obese.  Watch levels of cholesterol and blood lipids  You should start having your blood tested for lipids and cholesterol at 79 years of age, then have this test every 5 years.  You may need to have your cholesterol levels checked more often if:  Your lipid or cholesterol levels are high.  You are older than 79 years of age.  You are at high risk for heart disease.  CANCER SCREENING   Lung Cancer  Lung cancer screening is recommended for adults 25-51 years old who are at high risk for lung cancer because of a history of smoking.  A yearly low-dose CT scan of the lungs is recommended for people who:  Currently smoke.  Have quit within the past 15 years.  Have at least a 30-pack-year history of smoking. A pack year is smoking an average of one pack of cigarettes a day for 1 year.  Yearly screening should continue until it has been 15 years since you quit.  Yearly screening should stop if you develop a health problem that would prevent you from having lung cancer treatment.  Breast Cancer  Practice breast self-awareness. This means understanding how your breasts normally appear and feel.  It also means doing regular breast self-exams. Let your health care provider know about any changes, no matter how small.  If you are in your 76s or  3s, you should have a clinical breast exam (CBE) by a health care provider every 1-3 years as part of a regular health exam.  If you are 36 or older, have a CBE every year. Also consider having a breast X-ray (mammogram) every year.  If you have a family history of breast cancer, talk to your health care provider about genetic screening.  If you are at high risk for breast cancer, talk to your health care provider about having an MRI and a mammogram every year.  Breast cancer gene (BRCA) assessment is recommended for women who have family  members with BRCA-related cancers. BRCA-related cancers include:  Breast.  Ovarian.  Tubal.  Peritoneal cancers.  Results of the assessment will determine the need for genetic counseling and BRCA1 and BRCA2 testing. Cervical Cancer Routine pelvic examinations to screen for cervical cancer are no longer recommended for nonpregnant women who are considered low risk for cancer of the pelvic organs (ovaries, uterus, and vagina) and who do not have symptoms. A pelvic examination may be necessary if you have symptoms including those associated with pelvic infections. Ask your health care provider if a screening pelvic exam is right for you.   The Pap test is the screening test for cervical cancer for women who are considered at risk.  If you had a hysterectomy for a problem that was not cancer or a condition that could lead to cancer, then you no longer need Pap tests.  If you are older than 65 years, and you have had normal Pap tests for the past 10 years, you no longer need to have Pap tests.  If you have had past treatment for cervical cancer or a condition that could lead to cancer, you need Pap tests and screening for cancer for at least 20 years after your treatment.  If you no longer get a Pap test, assess your risk factors if they change (such as having a new sexual partner). This can affect whether you should start being screened again.  Some women have medical problems that increase their chance of getting cervical cancer. If this is the case for you, your health care provider may recommend more frequent screening and Pap tests.  The human papillomavirus (HPV) test is another test that may be used for cervical cancer screening. The HPV test looks for the virus that can cause cell changes in the cervix. The cells collected during the Pap test can be tested for HPV.  The HPV test can be used to screen women 67 years of age and older. Getting tested for HPV can extend the interval  between normal Pap tests from three to five years.  An HPV test also should be used to screen women of any age who have unclear Pap test results.  After 79 years of age, women should have HPV testing as often as Pap tests.  Colorectal Cancer  This type of cancer can be detected and often prevented.  Routine colorectal cancer screening usually begins at 79 years of age and continues through 79 years of age.  Your health care provider may recommend screening at an earlier age if you have risk factors for colon cancer.  Your health care provider may also recommend using home test kits to check for hidden blood in the stool.  A small camera at the end of a tube can be used to examine your colon directly (sigmoidoscopy or colonoscopy). This is done to check for the earliest forms of colorectal cancer.  Routine screening usually begins at age 63.  Direct examination of the colon should be repeated every 5-10 years through 79 years of age. However, you may need to be screened more often if early forms of precancerous polyps or small growths are found. Skin Cancer  Check your skin from head to toe regularly.  Tell your health care provider about any new moles or changes in moles, especially if there is a change in a mole's shape or color.  Also tell your health care provider if you have a mole that is larger than the size of a pencil eraser.  Always use sunscreen. Apply sunscreen liberally and repeatedly throughout the day.  Protect yourself by wearing long sleeves, pants, a wide-brimmed hat, and sunglasses whenever you are outside. HEART DISEASE, DIABETES, AND HIGH BLOOD PRESSURE   Have your blood pressure checked at least every 1-2 years. High blood pressure causes heart disease and increases the risk of stroke.  If you are between 19 years and 40 years old, ask your health care provider if you should take aspirin to prevent strokes.  Have regular diabetes screenings. This involves  taking a blood sample to check your fasting blood sugar level.  If you are at a normal weight and have a low risk for diabetes, have this test once every three years after 79 years of age.  If you are overweight and have a high risk for diabetes, consider being tested at a younger age or more often. PREVENTING INFECTION  Hepatitis B  If you have a higher risk for hepatitis B, you should be screened for this virus. You are considered at high risk for hepatitis B if:  You were born in a country where hepatitis B is common. Ask your health care provider which countries are considered high risk.  Your parents were born in a high-risk country, and you have not been immunized against hepatitis B (hepatitis B vaccine).  You have HIV or AIDS.  You use needles to inject street drugs.  You live with someone who has hepatitis B.  You have had sex with someone who has hepatitis B.  You get hemodialysis treatment.  You take certain medicines for conditions, including cancer, organ transplantation, and autoimmune conditions. Hepatitis C  Blood testing is recommended for:  Everyone born from [birth year] through 1963.  Anyone with known risk factors for hepatitis C. Sexually transmitted infections (STIs)  You should be screened for sexually transmitted infections (STIs) including gonorrhea and chlamydia if:  You are sexually active and are younger than 79 years of age.  You are older than 79 years of age and your health care provider tells you that you are at risk for this type of infection.  Your sexual activity has changed since you were last screened and you are at an increased risk for chlamydia or gonorrhea. Ask your health care provider if you are at risk.  If you do not have HIV, but are at risk, it may be recommended that you take a prescription medicine daily to prevent HIV infection. This is called pre-exposure prophylaxis (PrEP). You are considered at risk if:  You are sexually active  and do not regularly use condoms or know the HIV status of your partner(s).  You take drugs by injection.  You are sexually active with a partner who has HIV. Talk with your health care provider about whether you are at high risk of being infected with HIV. If you choose to begin PrEP, you should first  be tested for HIV. You should then be tested every 3 months for as long as you are taking PrEP.  PREGNANCY   If you are premenopausal and you may become pregnant, ask your health care provider about preconception counseling.  If you may become pregnant, take 400 to 800 micrograms (mcg) of folic acid every day.  If you want to prevent pregnancy, talk to your health care provider about birth control (contraception). OSTEOPOROSIS AND MENOPAUSE   Osteoporosis is a disease in which the bones lose minerals and strength with aging. This can result in serious bone fractures. Your risk for osteoporosis can be identified using a bone density scan.  If you are 6 years of age or older, or if you are at risk for osteoporosis and fractures, ask your health care provider if you should be screened.  Ask your health care provider whether you should take a calcium or vitamin D supplement to lower your risk for osteoporosis.  Menopause may have certain physical symptoms and risks.  Hormone replacement therapy may reduce some of these symptoms and risks. Talk to your health care provider about whether hormone replacement therapy is right for you.  HOME CARE INSTRUCTIONS   Schedule regular health, dental, and eye exams.  Stay current with your immunizations.   Do not use any tobacco products including cigarettes, chewing tobacco, or electronic cigarettes.  If you are pregnant, do not drink alcohol.  If you are breastfeeding, limit how much and how often you drink alcohol.  Limit alcohol intake to no more than 1 drink per day for nonpregnant women. One drink equals 12 ounces of beer, 5 ounces of wine,  or 1 ounces of hard liquor.  Do not use street drugs.  Do not share needles.  Ask your health care provider for help if you need support or information about quitting drugs.  Tell your health care provider if you often feel depressed.  Tell your health care provider if you have ever been abused or do not feel safe at home. Document Released: 11/24/2010 Document Revised: 09/25/2013 Document Reviewed: 04/12/2013 [facility] Patient Information 2015 [place], [place]. This information is not intended to replace advice given to you by your health care provider. Make sure you discuss any questions you have with your health care provider.

## 2014-10-17 NOTE — Progress Notes (Signed)
Patient ID: Kathryn Henson, female   DOB: [birth year], 81 y.o.   MRN: [id number]   Subjective:   Kathryn Henson is a 81 y.o. female who presents for an Initial Medicare Annual Wellness Visit. [name] is widowed and lives at home. She has 2 daughters and one son. She has a lot of support from her daughter [name] who helps with her housework and errands. She is also her [place].   Review of Systems     Cardiac Risk Factors include: advanced age (>33men, >42 women);diabetes mellitus;dyslipidemia;hypertension   Musculoskeletal: right shoulder and arm pain is much improved s/p steroid injection, PT, and nitroglycerin patches. She does complain of some lower extremity weakness and difficulty climbing stairs. She is able to maneuver the stairs into her home by using the handrails.   HEENT: Difficulty hearing at times  Urinary: Frequency. No dysuria or incontinence.   All other systems negative     Objective:    Today's Vitals   10/17/14 0940  BP: 144/82  Pulse: 67  Height: 5' (1.524 m)  Weight: 131 lb (59.421 kg)    Current Medications (verified) Outpatient Encounter Prescriptions as of 10/17/2014  Medication Sig  . aspirin 81 MG tablet Take 81 mg by mouth daily.  . Cholecalciferol (VITAMIN D-1000 MAX ST) 1000 UNITS tablet Take 1,000 Units by mouth daily.  . Coenzyme Q10 (COQ-10) 200 MG CAPS 200 mg. Take 200 mg by mouth daily.  Marland Kitchen glimepiride (AMARYL) 2 MG tablet Take 1 tablet (2 mg total) by mouth 2 (two) times daily with a meal.  . glucose blood (ACCU-CHEK COMPACT PLUS) test strip Check blood sugar 1-2 times daily  . lisinopril (PRINIVIL,ZESTRIL) 10 MG tablet TAKE ONE TABLET BY MOUTH ONCE DAILY  . lovastatin (MEVACOR) 40 MG tablet Take 1 tablet (40 mg total) by mouth at bedtime. Take 1 tablet (20 mg total) by mouth nightly.  . metoprolol (LOPRESSOR) 50 MG tablet Take 0.5 tablets (25 mg total) by mouth 2 (two) times daily.  . nitroGLYCERIN (NITROSTAT) 0.4 MG SL  tablet Place 0.4 mg under the tongue every 5 (five) minutes as needed for chest pain.  . [DISCONTINUED] glimepiride (AMARYL) 2 MG tablet TAKE ONE TABLET BY MOUTH TWICE DAILY WITH MEALS  . [DISCONTINUED] fluticasone (FLONASE) 50 MCG/ACT nasal spray Place 2 sprays into both nostrils daily.  . [DISCONTINUED] nitroGLYCERIN (MINITRAN) 0.1 mg/hr patch Place 1 patch (0.1 mg total) onto the skin daily. apply 1/3 patch to shoulder QD   No facility-administered encounter medications on file as of 10/17/2014.    Allergies (verified) Atorvastatin; Fluzone ; Influenza vaccines; Metformin; and Other   History: Past Medical History  Diagnosis Date  . Pacemaker   . Diabetes mellitus without complication   . Glaucoma   . Hyperlipidemia   . Hypertension   . Cancer     left breast  . Osteoporosis    Past Surgical History  Procedure Laterality Date  . Pacemaker insertion    . Lumpectomy left breast     Family History  Problem Relation Age of Onset  . Heart disease Mother   . Diabetes Mother   . Stroke Father    Social History   Occupational History  . Not on file.   Social History Main Topics  . Smoking status: Former Smoker    Types: Cigarettes    Quit date: 05/25/1968  . Smokeless tobacco: Never Used  . Alcohol Use: No  . Drug Use: No  .  Sexual Activity: No    Activities of Daily Living In your present state of health, do you have any difficulty performing the following activities: 10/17/2014  Hearing? Y  Vision? N  Difficulty concentrating or making decisions? Y  Walking or climbing stairs? Y  Dressing or bathing? N  Doing errands, shopping? N  Preparing Food and eating ? N  Using the Toilet? N  In the past six months, have you accidently leaked urine? N  Do you have problems with loss of bowel control? N  Managing your Medications? N  Managing your Finances? N  Housekeeping or managing your Housekeeping? N   She does complain of decreased hearing. No evaluation.  Audiology referral placed. At times she has problems concentrating and finds her mind wandering.  Difficulty with stairs but is able to climb them with handrails.   Immunizations and Health Maintenance Immunization History  Administered Date(s) Administered  . Pneumococcal Polysaccharide-23 05/25/1994  . Td 11/22/2009   Health Maintenance Due  Topic Date Due  . URINE MICROALBUMIN  05/12/2014  . FOOT EXAM  09/05/2014    Patient Care Team: Wardell Honour, MD as PCP - General (Family Medicine) Adelina Mings Margret Chance, MD as Referring Physician (Optometry) Hurman Horn, MD as Consulting Physician (Ophthalmology)  Indicate any recent Medical Services you may have received from other than [facility] providers in the past year (date may be approximate).     Assessment:   This is a routine wellness examination for [place].   Hearing/Vision screen No hearing or vision deficit noted during visit today  Dietary issues and exercise activities discussed: Current Exercise Habits:: Home exercise routine (Patient works out with an exercise video daily), Time (Minutes): 10, Frequency (Times/Week): 6, Weekly Exercise (Minutes/Week): 60, Intensity: Moderate She also gardens on most days.  Goals    . Exercise 3x per week (30 min per time)    . Reduce sugar intake to X grams per day     Ripe fruits are higher in sugar      Depression Screen PHQ 2/9 Scores 10/17/2014 03/15/2014 09/04/2013 08/11/2013  PHQ - 2 Score 2 0 0 0  PHQ- 9 Score 4 - - -  Lack of energy and motivation   Fall Risk Fall Risk  10/17/2014 03/15/2014 09/04/2013 08/11/2013 05/12/2013  Falls in the past year? No No No No No    Cognitive Function: MMSE - Mini Mental State Exam 10/17/2014  Orientation to time 5  Orientation to Place 5  Registration 3  Attention/ Calculation 5  Recall 2  Language- name 2 objects 2  Language- repeat 1  Language- follow 3 step command 3  Language- read & follow direction 1  Write a sentence 1  Copy  design 1  Total score 29  Normal screening  Screening Tests Health Maintenance  Topic Date Due  . URINE MICROALBUMIN  05/12/2014  . FOOT EXAM  09/05/2014  . PNA vac Low Risk Adult (2 of 2 - PCV13) 12/17/2014 (Originally 05/26/1995)  . ZOSTAVAX  01/17/2015 (Originally 10/17/1984)  . DEXA SCAN  10/17/2015 (Originally 10/17/1989)  . HEMOGLOBIN A1C  03/29/2015  . OPHTHALMOLOGY EXAM  06/05/2015  . TETANUS/TDAP  11/23/2019      Plan:  Keep follow up with Dr Sabra Heck in 12/2013 Continue to exercise daily. May want to add walking and small 2lb hand weights to routine.  Return FOBT Bring a copy of Advance Directives to place in chart. Move carefully to avoid falls. Throw rugs are a common  cause for falls.   During the course of the visit, Zyniah was educated and counseled about the following appropriate screening and preventive services:   Vaccines to include Pneumoccal-declined Prevnar, Influenza-Adverse reaction, Td-up to date, Zostavax-too expensive  Electrocardiogram-Due at next office visit  Cardiovascular disease screening-Lipids screened yearly. Statin intolerant.  Colorectal cancer screening-FOBT given. Not a candidate for colonoscopy due to age  Bone density screening-Declined. She will continue taking vitamin D and eating foods with calcium and will add weight bearing exercise to routine.   Glaucoma management- Followed by Dr Zadie Rhine  Mammography-done 06/27/14. Pap-not indicated  Nutrition counseling-Continue to eat a balanced diet with lean proteins, fruits, and vegetables. Avoid over ripe fruits because they are higher in sugar. Vit D and foods rich in calcium will help with bone mass.  Foot exam and micoalbumin need to be checked at next office visit.   Patient Instructions (the written plan) were given to the patient.    Chong Sicilian, RN  10/17/2014      I have reviewed and agree with the above AWV documentation.  Claretta Fraise, M.D.

## 2014-11-16 ENCOUNTER — Ambulatory Visit (INDEPENDENT_AMBULATORY_CARE_PROVIDER_SITE_OTHER): Payer: Medicare Other | Admitting: Physician Assistant

## 2014-11-16 ENCOUNTER — Ambulatory Visit: Payer: Medicare Other | Admitting: Physician Assistant

## 2014-11-16 ENCOUNTER — Encounter: Payer: Self-pay | Admitting: Physician Assistant

## 2014-11-16 VITALS — BP 127/81 | HR 79 | Temp 97.6°F | Ht 60.0 in | Wt 130.0 lb

## 2014-11-16 DIAGNOSIS — N6320 Unspecified lump in the left breast, unspecified quadrant: Secondary | ICD-10-CM

## 2014-11-16 DIAGNOSIS — Z853 Personal history of malignant neoplasm of breast: Secondary | ICD-10-CM | POA: Diagnosis not present

## 2014-11-16 DIAGNOSIS — N63 Unspecified lump in breast: Secondary | ICD-10-CM

## 2014-11-16 NOTE — Progress Notes (Signed)
   Subjective:    Patient ID: Kathryn Henson, female    DOB: 1922/09/23, 81 y.o.   MRN: [id number]  HPI 81 y/o female with h/o left breast cancer with lumpectomy approximately 10 years ago presents for breast exam. She has noticed a difference in density of her left breast and would like examination. Last mammogram was 06/2014, screening mammogram     Review of Systems  Skin:       Density/ mass  in left breast, "feeling of discomfort"       Objective:   Physical Exam  Skin:  No irregularities noted on right breast exam  1cm barely palpable mass in left breast at 10:00           Assessment & Plan:  1. Mass of breast, left  - MM Digital Diagnostic Unilat L; Future - US BREAST COMPLETE UNI LEFT INC AXILLA; Future  2. History of breast cancer  - MM Digital Diagnostic Unilat L; Future - US BREAST COMPLETE UNI LEFT INC AXILLA; Future  RTO prn   Yassin Scales A. Benjamin Stain PA-C

## 2014-11-19 ENCOUNTER — Other Ambulatory Visit: Payer: Self-pay

## 2014-11-21 ENCOUNTER — Other Ambulatory Visit: Payer: Self-pay | Admitting: Family Medicine

## 2014-11-28 ENCOUNTER — Ambulatory Visit
Admission: RE | Admit: 2014-11-28 | Discharge: 2014-11-28 | Disposition: A | Discharge status: Home / Self Care | Payer: Medicare Other | Source: Ambulatory Visit | Attending: Physician Assistant | Admitting: Physician Assistant

## 2014-11-28 ENCOUNTER — Other Ambulatory Visit: Payer: Self-pay | Admitting: Physician Assistant

## 2014-11-28 DIAGNOSIS — N6320 Unspecified lump in the left breast, unspecified quadrant: Secondary | ICD-10-CM

## 2014-11-28 DIAGNOSIS — Z853 Personal history of malignant neoplasm of breast: Secondary | ICD-10-CM

## 2014-12-03 LAB — HM DIABETES EYE EXAM

## 2014-12-11 ENCOUNTER — Telehealth: Payer: Self-pay | Admitting: Family Medicine

## 2014-12-12 NOTE — Telephone Encounter (Signed)
Foot exam is not until 01/07/15. rx and notes will be sent then. Pt aware

## 2014-12-18 ENCOUNTER — Ambulatory Visit: Payer: Medicare Other | Admitting: Family Medicine

## 2014-12-31 ENCOUNTER — Encounter: Payer: Self-pay | Admitting: Family Medicine

## 2015-01-07 ENCOUNTER — Ambulatory Visit: Payer: Medicare Other | Admitting: Family Medicine

## 2015-01-08 ENCOUNTER — Ambulatory Visit: Payer: Medicare Other | Admitting: Family Medicine

## 2015-04-29 ENCOUNTER — Telehealth: Payer: Self-pay | Admitting: Family Medicine

## 2015-04-30 NOTE — Telephone Encounter (Signed)
Claritin 1 daily as needed Nasal saline frequently through the day 1 spray each nostril Flonase one spray each nostril at bedtime

## 2015-04-30 NOTE — Telephone Encounter (Signed)
Pt's daughter aware.

## 2015-05-17 ENCOUNTER — Other Ambulatory Visit: Payer: Self-pay | Admitting: Family Medicine

## 2015-05-17 NOTE — Telephone Encounter (Signed)
Last seen 11/16/14  Tiffany

## 2015-05-17 NOTE — Telephone Encounter (Signed)
Last refill without being seen 

## 2015-06-12 ENCOUNTER — Other Ambulatory Visit: Payer: Self-pay

## 2015-06-12 DIAGNOSIS — Z1231 Encounter for screening mammogram for malignant neoplasm of breast: Secondary | ICD-10-CM

## 2015-06-19 ENCOUNTER — Other Ambulatory Visit: Payer: Self-pay | Admitting: Family Medicine

## 2015-06-20 NOTE — Telephone Encounter (Signed)
Last seen 11/16/14  Kathryn Henson

## 2015-07-04 ENCOUNTER — Other Ambulatory Visit: Payer: Self-pay

## 2015-07-04 DIAGNOSIS — E785 Hyperlipidemia, unspecified: Secondary | ICD-10-CM

## 2015-07-04 MED ORDER — LOVASTATIN 40 MG PO TABS: ORAL_TABLET | ORAL | Status: DC

## 2015-07-04 NOTE — Telephone Encounter (Signed)
Last seen 11/16/14  Kathryn Henson  Last lipid 09/26/14

## 2015-07-04 NOTE — Telephone Encounter (Signed)
Last refill without being seen 

## 2015-07-04 NOTE — Telephone Encounter (Signed)
Pt has future appt next week 2/15

## 2015-07-10 ENCOUNTER — Other Ambulatory Visit: Payer: Self-pay

## 2015-07-10 ENCOUNTER — Ambulatory Visit: Payer: Medicare Other | Admitting: Family Medicine

## 2015-07-10 ENCOUNTER — Ambulatory Visit
Admission: RE | Admit: 2015-07-10 | Discharge: 2015-07-10 | Disposition: A | Discharge status: Home / Self Care | Payer: Medicare Other | Source: Ambulatory Visit

## 2015-07-10 DIAGNOSIS — N6459 Other signs and symptoms in breast: Secondary | ICD-10-CM

## 2015-07-10 DIAGNOSIS — Z1231 Encounter for screening mammogram for malignant neoplasm of breast: Secondary | ICD-10-CM

## 2015-08-10 ENCOUNTER — Other Ambulatory Visit: Payer: Self-pay | Admitting: Nurse Practitioner

## 2015-08-13 NOTE — Telephone Encounter (Signed)
Scheduled patient for a follow up appointment with Dr. Sabra Heck on 09/05/15 at 3:30 pm- spoke with patient's daughter [name].

## 2015-08-22 ENCOUNTER — Other Ambulatory Visit: Payer: Self-pay | Admitting: Nurse Practitioner

## 2015-08-22 NOTE — Telephone Encounter (Signed)
Pt has appt on 4/13

## 2015-08-22 NOTE — Telephone Encounter (Signed)
Last seen 11/16/14  Tiffany  PCP  Dr Sabra Heck

## 2015-08-25 ENCOUNTER — Other Ambulatory Visit: Payer: Self-pay | Admitting: Nurse Practitioner

## 2015-08-26 NOTE — Telephone Encounter (Signed)
Last seen 11/16/14  Tiffany   Dr Sabra Heck PCP   Last lipid 09/26/14

## 2015-09-05 ENCOUNTER — Ambulatory Visit (INDEPENDENT_AMBULATORY_CARE_PROVIDER_SITE_OTHER): Payer: Medicare Other | Admitting: Family Medicine

## 2015-09-05 ENCOUNTER — Encounter: Payer: Self-pay | Admitting: Family Medicine

## 2015-09-05 VITALS — BP 124/78 | HR 68 | Temp 97.6°F | Ht 60.0 in | Wt 134.0 lb

## 2015-09-05 DIAGNOSIS — I10 Essential (primary) hypertension: Secondary | ICD-10-CM

## 2015-09-05 DIAGNOSIS — E119 Type 2 diabetes mellitus without complications: Secondary | ICD-10-CM

## 2015-09-05 DIAGNOSIS — E785 Hyperlipidemia, unspecified: Secondary | ICD-10-CM

## 2015-09-05 LAB — BAYER DCA HB A1C WAIVED: HB A1C (BAYER DCA - WAIVED): 7.8 % — ABNORMAL HIGH (ref <7.0)

## 2015-09-05 MED ORDER — GLIMEPIRIDE 2 MG PO TABS: 2.0000 mg | ORAL_TABLET | Freq: Two times a day (BID) | ORAL | Status: DC

## 2015-09-05 MED ORDER — LOVASTATIN 40 MG PO TABS: 40.0000 mg | ORAL_TABLET | Freq: Every day | ORAL | Status: DC

## 2015-09-05 NOTE — Progress Notes (Signed)
Subjective:    Patient ID: Kathryn Henson, female    DOB: 1923-05-02, 82 y.o.   MRN: [id number]  HPI 82 year old female with diabetes osteoporosis hypertension and pacemaker. She is generally doing well. Appetite is good. Weight has been maintained since her last visit almost a year ago. Daughter who accompanies her is concerned about a number of medicines and in reviewing a think we could probably stop the statin since she has done well for most of her 53 years not taking a statin. I do not think we could stop her diabetes medicine her ACE inhibitor or her heart medicines.  Patient Active Problem List   Diagnosis Date Noted  . Arthritis 08/11/2013  . Swelling of ankle 08/11/2013  . Seborrheic keratoses, inflamed 05/29/2013  . Cellulitis 05/29/2013  . Allergic rhinitis 05/13/2013  . Other malaise and fatigue 05/13/2013  . Hyperlipemia 05/13/2013  . Diabetes ([place]) 05/13/2013  . Cancer ([place])   . Osteoporosis   . Diabetes mellitus without complication ([place])   . Glaucoma   . Hyperlipidemia   . Hypertension   . Pacemaker   . Syncope and collapse 09/10/2012  . Stress-induced cardiomyopathy 11/17/2011   Outpatient Encounter Prescriptions as of 09/05/2015  Medication Sig  . aspirin 81 MG tablet Take 81 mg by mouth daily.  . Cholecalciferol (VITAMIN D-1000 MAX ST) 1000 UNITS tablet Take 1,000 Units by mouth daily.  . Coenzyme Q10 (COQ-10) 200 MG CAPS 200 mg. Take 200 mg by mouth daily.  Marland Kitchen lisinopril (PRINIVIL,ZESTRIL) 10 MG tablet TAKE ONE TABLET BY MOUTH ONCE DAILY  . metoprolol (LOPRESSOR) 50 MG tablet TAKE ONE-HALF TABLET BY MOUTH TWICE DAILY  . [DISCONTINUED] glimepiride (AMARYL) 2 MG tablet TAKE ONE TABLET BY MOUTH TWICE DAILY WITH MEALS  . [DISCONTINUED] lovastatin (MEVACOR) 40 MG tablet TAKE ONE TABLET BY MOUTH AT BEDTIME  . glimepiride (AMARYL) 2 MG tablet Take 1 tablet (2 mg total) by mouth 2 (two) times daily with a meal.  . glucose blood (ACCU-CHEK COMPACT PLUS) test strip  Check blood sugar 1-2 times daily  . lovastatin (MEVACOR) 40 MG tablet Take 1 tablet (40 mg total) by mouth at bedtime.  . nitroGLYCERIN (NITROSTAT) 0.4 MG SL tablet Place 0.4 mg under the tongue every 5 (five) minutes as needed for chest pain.  . [DISCONTINUED] glimepiride (AMARYL) 2 MG tablet TAKE ONE TABLET BY MOUTH TWICE DAILY WITH MEALS   No facility-administered encounter medications on file as of 09/05/2015.      Review of Systems  Constitutional: Negative.   HENT: Negative.   Respiratory: Negative.   Cardiovascular: Negative.   Genitourinary: Negative.   Neurological: Negative.   Psychiatric/Behavioral: Negative.        Objective:   Physical Exam  Constitutional: She is oriented to person, place, and time. She appears well-developed and well-nourished.  Cardiovascular: Normal rate and regular rhythm.   Murmur heard. Pulmonary/Chest: Effort normal and breath sounds normal.  Musculoskeletal: She exhibits no edema.  Neurological: She is alert and oriented to person, place, and time.  Psychiatric: She has a normal mood and affect. Thought content normal.          Assessment & Plan:  1. Essential hypertension Blood pressure is well controlled on current regimen - CMP14+EGFR  2. Diabetes mellitus without complication (HCC) SHe monitors her sugars at home generally less and 120 fasting - Bayer DCA Hb A1c Waived - Microalbumin / creatinine urine ratio  3. Hyperlipidemia Plan to stop statin at age 14 - Lipid  panel  Wardell Honour MD

## 2015-09-06 LAB — LIPID PANEL
CHOL/HDL RATIO: 3 ratio (ref 0.0–4.4)
Cholesterol, Total: 226 mg/dL — ABNORMAL HIGH (ref 100–199)
HDL: 75 mg/dL (ref >39)
LDL CALC: 136 mg/dL — AB (ref 0–99)
Triglycerides: 75 mg/dL (ref 0–149)
VLDL Cholesterol Cal: 15 mg/dL (ref 5–40)

## 2015-09-06 LAB — CMP14+EGFR
A/G RATIO: 1.4 (ref 1.2–2.2)
ALT: 12 IU/L (ref 0–32)
AST: 19 IU/L (ref 0–40)
Albumin: 3.9 g/dL (ref 3.2–4.6)
Alkaline Phosphatase: 81 IU/L (ref 39–117)
BUN/Creatinine Ratio: 22 (ref 12–28)
BUN: 20 mg/dL (ref 10–36)
Bilirubin Total: 0.5 mg/dL (ref 0.0–1.2)
CALCIUM: 8.9 mg/dL (ref 8.7–10.3)
CO2: 24 mmol/L (ref 18–29)
Chloride: 104 mmol/L (ref 96–106)
Creatinine, Ser: 0.93 mg/dL (ref 0.57–1.00)
GFR, EST AFRICAN AMERICAN: 63 mL/min/{1.73_m2} (ref >59)
GFR, EST NON AFRICAN AMERICAN: 54 mL/min/{1.73_m2} — AB (ref >59)
Globulin, Total: 2.7 g/dL (ref 1.5–4.5)
Glucose: 82 mg/dL (ref 65–99)
POTASSIUM: 4.1 mmol/L (ref 3.5–5.2)
Sodium: 144 mmol/L (ref 134–144)
TOTAL PROTEIN: 6.6 g/dL (ref 6.0–8.5)

## 2015-09-06 LAB — MICROALBUMIN / CREATININE URINE RATIO
CREATININE, UR: 133.7 mg/dL
MICROALB/CREAT RATIO: 7 mg/g creat (ref 0.0–30.0)
Microalbumin, Urine: 9.3 ug/mL

## 2015-09-23 ENCOUNTER — Other Ambulatory Visit: Payer: Self-pay | Admitting: Family Medicine

## 2015-11-25 ENCOUNTER — Telehealth: Payer: Self-pay | Admitting: Family Medicine

## 2015-11-25 ENCOUNTER — Ambulatory Visit (INDEPENDENT_AMBULATORY_CARE_PROVIDER_SITE_OTHER): Payer: Medicare Other | Admitting: Family

## 2015-11-25 ENCOUNTER — Encounter: Payer: Self-pay | Admitting: Family

## 2015-11-25 VITALS — BP 133/79 | HR 80 | Temp 98.0°F | Ht 60.0 in | Wt 132.4 lb

## 2015-11-25 DIAGNOSIS — H6121 Impacted cerumen, right ear: Secondary | ICD-10-CM

## 2015-11-25 MED ORDER — BLOOD GLUCOSE MONITOR KIT: PACK | Status: DC

## 2015-11-25 NOTE — Progress Notes (Signed)
   Subjective:    Patient ID: Kathryn Henson, female    DOB: [birth year], 82 y.o.   MRN: [id number]  Ear Fullness  There is pain in both ears. This is a new problem. The current episode started more than 1 month ago. The problem occurs constantly. The problem has been unchanged. There has been no fever. The pain is at a severity of 0/10. The patient is experiencing no pain. Associated symptoms include hearing loss and rhinorrhea. Pertinent negatives include no coughing, diarrhea, ear discharge, headaches or sore throat. She has tried nothing for the symptoms. The treatment provided no relief.      Review of Systems  Constitutional: Negative.   HENT: Positive for hearing loss and rhinorrhea. Negative for ear discharge and sore throat.   Eyes: Negative.   Respiratory: Negative.  Negative for cough and shortness of breath.   Cardiovascular: Negative.  Negative for palpitations.  Gastrointestinal: Negative.  Negative for diarrhea.  Endocrine: Negative.   Genitourinary: Negative.   Musculoskeletal: Negative.   Neurological: Negative.  Negative for headaches.  Hematological: Negative.   Psychiatric/Behavioral: Negative.   All other systems reviewed and are negative.      Objective:   Physical Exam  Constitutional: She is oriented to person, place, and time. She appears well-developed and well-nourished. No distress.  HENT:  Head: Normocephalic and atraumatic.  Nose: Nose normal.  Mouth/Throat: Oropharynx is clear and moist.  Eyes: Pupils are equal, round, and reactive to light.  Neck: Normal range of motion. Neck supple. No thyromegaly present.  Cardiovascular: Normal rate, regular rhythm, normal heart sounds and intact distal pulses.   No murmur heard. Pulmonary/Chest: Effort normal and breath sounds normal. No respiratory distress. She has no wheezes.  Abdominal: Soft. Bowel sounds are normal. She exhibits no distension. There is no tenderness.  Musculoskeletal: Normal range of  motion. She exhibits no edema or tenderness.  Neurological: She is alert and oriented to person, place, and time. She has normal reflexes. No cranial nerve deficit.  Skin: Skin is warm and dry.  Psychiatric: She has a normal mood and affect. Her behavior is normal. Judgment and thought content normal.  Vitals reviewed.  Right ear cleaned with warm water and peroxide- TM WNL   BP 133/79 mmHg  Pulse 80  Temp(Src) 98 F (36.7 C) (Oral)  Ht 5' (1.524 m)  Wt 132 lb 6.4 oz (60.056 kg)  BMI 25.86 kg/m2     Assessment & Plan:  1. Cerumen impaction, right -Keep ears clean and dry -Do not insert objects into ear -Force fluids -OTC ear drops RTO prn   Evelina Dun, FNP

## 2015-11-25 NOTE — Patient Instructions (Signed)
Cerumen Impaction The structures of the external ear canal secrete a waxy substance known as cerumen. Excess cerumen can build up in the ear canal, causing a condition known as cerumen impaction. Cerumen impaction can cause ear pain and disrupt the function of the ear. The rate of cerumen production differs for each individual. In certain individuals, the configuration of the ear canal may decrease his or her ability to naturally remove cerumen. CAUSES Cerumen impaction is caused by excessive cerumen production or buildup. RISK FACTORS  Frequent use of swabs to clean ears.  Having narrow ear canals.  Having eczema.  Being dehydrated. SIGNS AND SYMPTOMS  Diminished hearing.  Ear drainage.  Ear pain.  Ear itch. TREATMENT Treatment may involve:  Over-the-counter or prescription ear drops to soften the cerumen.  Removal of cerumen by a health care provider. This may be done with:  Irrigation with warm water. This is the most common method of removal.  Ear curettes and other instruments.  Surgery. This may be done in severe cases. HOME CARE INSTRUCTIONS  Take medicines only as directed by your health care provider.  Do not insert objects into the ear with the intent of cleaning the ear. PREVENTION  Do not insert objects into the ear, even with the intent of cleaning the ear. Removing cerumen as a part of normal hygiene is not necessary, and the use of swabs in the ear canal is not recommended.  Drink enough water to keep your urine clear or pale yellow.  Control your eczema if you have it. SEEK MEDICAL CARE IF:  You develop ear pain.  You develop bleeding from the ear.  The cerumen does not clear after you use ear drops as directed.   This information is not intended to replace advice given to you by your health care provider. Make sure you discuss any questions you have with your health care provider.   Document Released: 06/18/2004 Document Revised: 06/01/2014  Document Reviewed: 12/26/2014 Elsevier Interactive Patient Education 2016 [employer]  

## 2015-11-25 NOTE — Telephone Encounter (Signed)
Accucheck sent to pharmacy. Left detailed message to patient, her request was sent to the pharmacy.

## 2015-12-16 ENCOUNTER — Telehealth: Payer: Self-pay | Admitting: Family Medicine

## 2015-12-16 NOTE — Telephone Encounter (Signed)
Patient scheduled to have office visit with Dr. Sabra Heck tomorrow.  If frequent, plentiful amounts of blood are passed , go to the emergency room.

## 2015-12-17 ENCOUNTER — Ambulatory Visit (INDEPENDENT_AMBULATORY_CARE_PROVIDER_SITE_OTHER): Payer: Medicare Other | Admitting: Family Medicine

## 2015-12-17 ENCOUNTER — Encounter: Payer: Self-pay | Admitting: Family Medicine

## 2015-12-17 VITALS — BP 122/74 | HR 72 | Temp 98.5°F | Ht 60.0 in | Wt 132.8 lb

## 2015-12-17 DIAGNOSIS — K625 Hemorrhage of anus and rectum: Secondary | ICD-10-CM

## 2015-12-17 MED ORDER — HYDROCORTISONE 2.5 % RE CREA: 1.0000 "application " | TOPICAL_CREAM | Freq: Two times a day (BID) | RECTAL | 0 refills | Status: DC

## 2015-12-17 NOTE — Progress Notes (Signed)
   Subjective:    Patient ID: Kathryn Henson, female    DOB: [birth year], 82 y.o.   MRN: [id number]  HPI patient has a history of bright red rectal bleeding for the past 3-4 days. She denies having to strain at stool or being constipated.  There is no history of hemorrhoids. There is no pain Patient Active Problem List   Diagnosis Date Noted  . Arthritis 08/11/2013  . Swelling of ankle 08/11/2013  . Seborrheic keratoses, inflamed 05/29/2013  . Cellulitis 05/29/2013  . Allergic rhinitis 05/13/2013  . Other malaise and fatigue 05/13/2013  . Hyperlipemia 05/13/2013  . Diabetes ([place]) 05/13/2013  . Cancer ([place])   . Osteoporosis   . Diabetes mellitus without complication ([place])   . Glaucoma   . Hyperlipidemia   . Hypertension   . Pacemaker   . Syncope and collapse 09/10/2012  . Stress-induced cardiomyopathy 11/17/2011   Outpatient Encounter Prescriptions as of 12/17/2015  Medication Sig  . ACCU-CHEK SOFTCLIX LANCETS lancets   . aspirin 81 MG tablet Take 81 mg by mouth daily.  . blood glucose meter kit and supplies KIT Dispense based on patient and insurance preference. Use up to four times daily as directed. (FOR ICD-9 250.00, 250.01).  . Blood Glucose Monitoring Suppl (ACCU-CHEK COMPACT CARE KIT) KIT   . Cholecalciferol (VITAMIN D-1000 MAX ST) 1000 UNITS tablet Take 1,000 Units by mouth daily.  . Coenzyme Q10 (COQ-10) 200 MG CAPS 200 mg. Take 200 mg by mouth daily.  Marland Kitchen glimepiride (AMARYL) 2 MG tablet Take 1 tablet (2 mg total) by mouth 2 (two) times daily with a meal.  . glucose blood (ACCU-CHEK COMPACT PLUS) test strip Check blood sugar 1-2 times daily  . lisinopril (PRINIVIL,ZESTRIL) 10 MG tablet TAKE ONE TABLET BY MOUTH ONCE DAILY  . lovastatin (MEVACOR) 40 MG tablet Take 1 tablet (40 mg total) by mouth at bedtime.  . metoprolol (LOPRESSOR) 50 MG tablet TAKE ONE-HALF TABLET BY MOUTH TWICE DAILY  . nitroGLYCERIN (NITROSTAT) 0.4 MG SL tablet Place 0.4 mg under the tongue every 5  (five) minutes as needed for chest pain.   No facility-administered encounter medications on file as of 12/17/2015.       Review of Systems  Constitutional: Negative.   Respiratory: Negative.   Cardiovascular: Negative.   Gastrointestinal:       Bleeding per rectum as noted in history of present illness       Objective:   Physical Exam  Constitutional: She appears well-developed and well-nourished.  Genitourinary:  Genitourinary Comments: Rectum: There is some external hemorrhoids present. Internal exam reveals no lumps or nodules or clot. I do not feel any mass or other abnormal tissue. Would assume this bleeding is hemorrhoidal but cannot rule out polyp or other pathology higher in the rectosigmoid region. I explained this to patient and her daughter   There were no vitals taken for this visit.        Assessment & Plan:  1. Rectal bleeding Assume this is hemorrhoidal for the time being. Will treat locally with Anusol HC cream. If symptoms do not resolve with this treatment would refer for sigmoid exam to rule out other source of bleeding call back in one week or sooner as needed to follow-up  Wardell Honour MD

## 2016-01-06 ENCOUNTER — Other Ambulatory Visit: Payer: Self-pay | Admitting: Family Medicine

## 2016-01-25 ENCOUNTER — Other Ambulatory Visit: Payer: Self-pay | Admitting: Family Medicine

## 2016-02-18 ENCOUNTER — Encounter: Payer: Self-pay | Admitting: *Deleted

## 2016-03-02 NOTE — Progress Notes (Signed)
Subjective:    Patient ID: NONI STONESIFER, female    DOB: October 30, 1922, 82 y.o.   MRN: [id number]  HPI 41 year young African-American female who is here for 6 month follow-up visit. Today she is complaining of left knee pain. She denies any creaking or cracking and no giving way and no swelling area otherwise she has no particular complaints. She brings a form today to fill out for adult daycare. The form requests a TB test and history. It is similar to an [place] 2 but it is just for daycare.  Patient Active Problem List   Diagnosis Date Noted  . Arthritis 08/11/2013  . Swelling of ankle 08/11/2013  . Seborrheic keratoses, inflamed 05/29/2013  . Cellulitis 05/29/2013  . Allergic rhinitis 05/13/2013  . Other malaise and fatigue 05/13/2013  . Hyperlipemia 05/13/2013  . Diabetes ([place]) 05/13/2013  . Cancer ([place])   . Osteoporosis   . Diabetes mellitus without complication ([place])   . Glaucoma   . Hyperlipidemia   . Hypertension   . Pacemaker   . Syncope and collapse 09/10/2012  . Stress-induced cardiomyopathy 11/17/2011   Outpatient Encounter Prescriptions as of 03/03/2016  Medication Sig  . ACCU-CHEK COMPACT PLUS test strip USE TO CHECK GLUCOSE UP TO 4 TIMES DAILY AS DIRECTED  . ACCU-CHEK SOFTCLIX LANCETS lancets   . aspirin 81 MG tablet Take 81 mg by mouth daily.  . blood glucose meter kit and supplies KIT Dispense based on patient and insurance preference. Use up to four times daily as directed. (FOR ICD-9 250.00, 250.01).  . Blood Glucose Monitoring Suppl (ACCU-CHEK COMPACT CARE KIT) KIT   . Cholecalciferol (VITAMIN D-1000 MAX ST) 1000 UNITS tablet Take 1,000 Units by mouth daily.  . Coenzyme Q10 (COQ-10) 200 MG CAPS 200 mg. Take 200 mg by mouth daily.  Marland Kitchen glimepiride (AMARYL) 2 MG tablet Take 1 tablet (2 mg total) by mouth 2 (two) times daily with a meal.  . hydrocortisone (ANUSOL-HC) 2.5 % rectal cream Place 1 application rectally 2 (two) times daily.  Marland Kitchen lisinopril  (PRINIVIL,ZESTRIL) 10 MG tablet TAKE ONE TABLET BY MOUTH ONCE DAILY  . metoprolol (LOPRESSOR) 50 MG tablet TAKE ONE-HALF TABLET BY MOUTH TWICE DAILY  . nitroGLYCERIN (NITROSTAT) 0.4 MG SL tablet Place 0.4 mg under the tongue every 5 (five) minutes as needed for chest pain.  . [DISCONTINUED] lovastatin (MEVACOR) 40 MG tablet Take 1 tablet (40 mg total) by mouth at bedtime.   No facility-administered encounter medications on file as of 03/03/2016.       Review of Systems  Constitutional: Negative.   HENT: Negative.   Eyes: Negative.   Respiratory: Negative.   Cardiovascular: Negative.   Gastrointestinal: Negative.   Neurological: Negative.   Psychiatric/Behavioral: Negative.        Objective:   Physical Exam  Constitutional: She appears well-developed and well-nourished.  Cardiovascular: Normal rate and regular rhythm.   Has pacemaker  Pulmonary/Chest: Effort normal and breath sounds normal.  Musculoskeletal:  Left knee has some crepitance with flexion and extension   BP 115/66   Pulse 75   Temp 97.9 F (36.6 C) (Oral)   Ht 5' (1.524 m)   Wt 132 lb (59.9 kg)   BMI 25.78 kg/m         Assessment & Plan:  1. Hyperlipidemia, unspecified hyperlipidemia type Statin was discontinued at last visit 6 months ago. Plan was to recheck it. Hopefully we will not need to restart statin at her age - Lipid panel -  TB Skin Test  2. Essential hypertension Blood pressure is well controlled at 115/66  3. Diabetes mellitus without complication (HCC) Last Q1E was 7.8. She takes only glimepiride  Wardell Honour MD

## 2016-03-03 ENCOUNTER — Encounter: Payer: Self-pay | Admitting: Family Medicine

## 2016-03-03 ENCOUNTER — Ambulatory Visit (INDEPENDENT_AMBULATORY_CARE_PROVIDER_SITE_OTHER): Payer: Medicare Other | Admitting: Family Medicine

## 2016-03-03 VITALS — BP 115/66 | HR 75 | Temp 97.9°F | Ht 60.0 in | Wt 132.0 lb

## 2016-03-03 DIAGNOSIS — E785 Hyperlipidemia, unspecified: Secondary | ICD-10-CM

## 2016-03-03 DIAGNOSIS — I10 Essential (primary) hypertension: Secondary | ICD-10-CM

## 2016-03-03 DIAGNOSIS — Z111 Encounter for screening for respiratory tuberculosis: Secondary | ICD-10-CM | POA: Diagnosis not present

## 2016-03-03 DIAGNOSIS — E119 Type 2 diabetes mellitus without complications: Secondary | ICD-10-CM | POA: Diagnosis not present

## 2016-03-04 LAB — LIPID PANEL
CHOL/HDL RATIO: 4.2 ratio (ref 0.0–4.4)
Cholesterol, Total: 280 mg/dL — ABNORMAL HIGH (ref 100–199)
HDL: 67 mg/dL (ref >39)
LDL Calculated: 194 mg/dL — ABNORMAL HIGH (ref 0–99)
Triglycerides: 96 mg/dL (ref 0–149)
VLDL CHOLESTEROL CAL: 19 mg/dL (ref 5–40)

## 2016-03-06 ENCOUNTER — Ambulatory Visit: Payer: Medicare Other | Admitting: Family Medicine

## 2016-03-09 ENCOUNTER — Telehealth: Payer: Self-pay | Admitting: Family Medicine

## 2016-03-09 NOTE — Telephone Encounter (Signed)
Pt's daughter does want to restart Lovastatin Please advise

## 2016-03-10 MED ORDER — LOVASTATIN 20 MG PO TABS: 20.0000 mg | ORAL_TABLET | Freq: Every day | ORAL | 3 refills | Status: DC

## 2016-03-10 NOTE — Addendum Note (Signed)
Addended by: Wardell Honour on: 03/10/2016 07:52 AM   Modules accepted: Orders

## 2016-03-10 NOTE — Telephone Encounter (Signed)
Rx for lovastatin sent to [facility]

## 2016-04-04 ENCOUNTER — Other Ambulatory Visit: Payer: Self-pay | Admitting: Family Medicine

## 2016-06-03 ENCOUNTER — Ambulatory Visit: Payer: Medicare Other | Admitting: Family Medicine

## 2016-06-11 ENCOUNTER — Ambulatory Visit: Payer: Medicare Other | Admitting: Family Medicine

## 2016-06-16 NOTE — Progress Notes (Signed)
Subjective:    Patient ID: Kathryn Henson, female    DOB: 12-Jun-1922, 83 y.o.   MRN: [id number]  HPI 83 year old female who is here to follow-up her blood pressure and lipids. She is amazingly perked and perky for her age. She thinks there are some memory problems but when I did a brief mental status exam she answered questions appropriately, so I do not think her memory issue concern requires treatment. She has several issues including some occasional headaches and pain in her shoulders and back that I think are probably related to aging.  Patient Active Problem List   Diagnosis Date Noted  . Arthritis 08/11/2013  . Swelling of ankle 08/11/2013  . Seborrheic keratoses, inflamed 05/29/2013  . Cellulitis 05/29/2013  . Allergic rhinitis 05/13/2013  . Other malaise and fatigue 05/13/2013  . Hyperlipemia 05/13/2013  . Diabetes ([place]) 05/13/2013  . Cancer ([place])   . Osteoporosis   . Diabetes mellitus without complication ([place])   . Glaucoma   . Hyperlipidemia   . Hypertension   . Pacemaker   . Syncope and collapse 09/10/2012  . Stress-induced cardiomyopathy 11/17/2011   Outpatient Encounter Prescriptions as of 06/17/2016  Medication Sig  . ACCU-CHEK COMPACT PLUS test strip USE TO CHECK GLUCOSE UP TO 4 TIMES DAILY AS DIRECTED  . ACCU-CHEK COMPACT PLUS test strip USE TO CHECK GLUCOSE ONCE TO TWICE DAILY  . ACCU-CHEK SOFTCLIX LANCETS lancets   . aspirin 81 MG tablet Take 81 mg by mouth daily.  . blood glucose meter kit and supplies KIT Dispense based on patient and insurance preference. Use up to four times daily as directed. (FOR ICD-9 250.00, 250.01).  . Blood Glucose Monitoring Suppl (ACCU-CHEK COMPACT CARE KIT) KIT   . Cholecalciferol (VITAMIN D-1000 MAX ST) 1000 UNITS tablet Take 1,000 Units by mouth daily.  . Coenzyme Q10 (COQ-10) 200 MG CAPS 200 mg. Take 200 mg by mouth daily.  Marland Kitchen glimepiride (AMARYL) 2 MG tablet Take 1 tablet (2 mg total) by mouth 2 (two) times daily with a meal.    . hydrocortisone (ANUSOL-HC) 2.5 % rectal cream Place 1 application rectally 2 (two) times daily.  Marland Kitchen lisinopril (PRINIVIL,ZESTRIL) 10 MG tablet TAKE ONE TABLET BY MOUTH ONCE DAILY  . lovastatin (MEVACOR) 20 MG tablet Take 1 tablet (20 mg total) by mouth at bedtime.  . metoprolol (LOPRESSOR) 50 MG tablet TAKE ONE-HALF TABLET BY MOUTH TWICE DAILY  . nitroGLYCERIN (NITROSTAT) 0.4 MG SL tablet Place 0.4 mg under the tongue every 5 (five) minutes as needed for chest pain.   No facility-administered encounter medications on file as of 06/17/2016.       Review of Systems  Constitutional: Negative.   HENT: Negative.   Respiratory: Negative.   Cardiovascular: Negative.   Neurological: Positive for headaches.  Psychiatric/Behavioral: Negative.        Objective:   Physical Exam  Constitutional: She is oriented to person, place, and time. She appears well-developed and well-nourished.  Cardiovascular: Normal rate and regular rhythm.   Pulmonary/Chest: Effort normal and breath sounds normal.  Neurological: She is alert and oriented to person, place, and time.  Psychiatric: She has a normal mood and affect.   BP 119/76   Pulse 80   Temp 97.3 F (36.3 C) (Oral)   Ht 5' (1.524 m)   Wt 136 lb 3.2 oz (61.8 kg)   BMI 26.60 kg/m         Assessment & Plan:  1. Essential hypertension Blood pressure is good  at 119/76. Continue metoprolol and lisinopril  2. Diabetes mellitus without complication ([place]) She is on glimepiride. She does complain of some frequency and thirstiness so I wonder if her sugars are little high. Last A1c 10 months ago was 7.8 - Bayer DCA Hb A1c Waived  3. Hyperlipidemia, unspecified hyperlipidemia type She takes Mevacor but her lipids are not at goal. Will consider changing to more potent statin like Crestor - Lipid   Wardell Honour MD

## 2016-06-17 ENCOUNTER — Encounter: Payer: Self-pay | Admitting: Family Medicine

## 2016-06-17 ENCOUNTER — Ambulatory Visit (INDEPENDENT_AMBULATORY_CARE_PROVIDER_SITE_OTHER): Payer: Medicare Other | Admitting: Family Medicine

## 2016-06-17 VITALS — BP 119/76 | HR 80 | Temp 97.3°F | Ht 60.0 in | Wt 136.2 lb

## 2016-06-17 DIAGNOSIS — I10 Essential (primary) hypertension: Secondary | ICD-10-CM

## 2016-06-17 DIAGNOSIS — E119 Type 2 diabetes mellitus without complications: Secondary | ICD-10-CM

## 2016-06-17 DIAGNOSIS — E785 Hyperlipidemia, unspecified: Secondary | ICD-10-CM

## 2016-06-17 LAB — BAYER DCA HB A1C WAIVED: HB A1C: 8.3 % — AB (ref <7.0)

## 2016-06-18 ENCOUNTER — Other Ambulatory Visit: Payer: Self-pay | Admitting: *Deleted

## 2016-06-18 LAB — LIPID PANEL
Chol/HDL Ratio: 3.8 ratio units (ref 0.0–4.4)
Cholesterol, Total: 253 mg/dL — ABNORMAL HIGH (ref 100–199)
HDL: 66 mg/dL (ref >39)
LDL Calculated: 174 mg/dL — ABNORMAL HIGH (ref 0–99)
TRIGLYCERIDES: 64 mg/dL (ref 0–149)
VLDL Cholesterol Cal: 13 mg/dL (ref 5–40)

## 2016-06-18 MED ORDER — ROSUVASTATIN CALCIUM 5 MG PO TABS: 5.0000 mg | ORAL_TABLET | Freq: Every day | ORAL | 1 refills | Status: DC

## 2016-07-10 ENCOUNTER — Telehealth: Payer: Self-pay | Admitting: Pharmacist

## 2016-07-10 NOTE — Telephone Encounter (Signed)
Patient will be due follow up of DM in 2 months - appt needed  Left message for patient's daughter to schedule appt with new PCP

## 2016-08-06 ENCOUNTER — Other Ambulatory Visit: Payer: Self-pay | Admitting: Family Medicine

## 2016-08-18 ENCOUNTER — Other Ambulatory Visit: Payer: Self-pay | Admitting: Family Medicine

## 2016-09-21 ENCOUNTER — Other Ambulatory Visit: Payer: Self-pay | Admitting: Pediatrics

## 2016-09-21 ENCOUNTER — Ambulatory Visit (INDEPENDENT_AMBULATORY_CARE_PROVIDER_SITE_OTHER): Payer: Medicare Other | Admitting: Pediatrics

## 2016-09-21 ENCOUNTER — Encounter: Payer: Self-pay | Admitting: Pediatrics

## 2016-09-21 VITALS — BP 120/73 | HR 76 | Temp 97.6°F | Ht 60.0 in | Wt 136.2 lb

## 2016-09-21 DIAGNOSIS — Z780 Asymptomatic menopausal state: Secondary | ICD-10-CM | POA: Diagnosis not present

## 2016-09-21 DIAGNOSIS — E119 Type 2 diabetes mellitus without complications: Secondary | ICD-10-CM

## 2016-09-21 DIAGNOSIS — B351 Tinea unguium: Secondary | ICD-10-CM | POA: Diagnosis not present

## 2016-09-21 DIAGNOSIS — E785 Hyperlipidemia, unspecified: Secondary | ICD-10-CM

## 2016-09-21 DIAGNOSIS — Z1231 Encounter for screening mammogram for malignant neoplasm of breast: Secondary | ICD-10-CM

## 2016-09-21 DIAGNOSIS — I10 Essential (primary) hypertension: Secondary | ICD-10-CM

## 2016-09-21 LAB — BAYER DCA HB A1C WAIVED: HB A1C: 8.1 % — AB (ref <7.0)

## 2016-09-21 MED ORDER — EFINACONAZOLE 10 % EX SOLN: 1.0000 "application " | Freq: Every day | CUTANEOUS | 6 refills | Status: DC

## 2016-09-21 NOTE — Progress Notes (Signed)
Subjective:   Patient ID: Kathryn Henson, female    DOB: 1922/06/28, 83 y.o.   MRN: [id number] CC: Follow-up (3 month, diabetic); Fatigue; Hip weakness (with activity); and Headache (Right side, occasional)  HPI: Kathryn Henson is a 83 y.o. female presenting for Follow-up (3 month, diabetic); Fatigue; Hip weakness (with activity); and Headache (Right side, occasional)  Here today with daughter Kathryn Henson Hips b/l are sore at times when walking, says they dont hurt her, hips "get tired when she walks 200 yds or more, daily walks down to her mailbox across to her daughters house, around the house outside  Has a "ping" of a headache on the R side rarely, lasts for seconds, doesn't hurt much, says she just notices it Feels steady on her feet No weakness  No recent falls Has a pacemaker, bursitis in R shoulder, shoulder not bothering her much Normal ROM in shoulders b/l  DM2: usually 100s in the morning, sometimes low 100s, sometimes higher 100s Depends on what she eats the night before  Has noticed thickened of toenails for last few years  Does not remember having a dexa scan recently Does take vitamin D regularly  Relevant past medical, surgical, family and social history reviewed. Allergies and medications reviewed and updated. History  Smoking Status  . Former Smoker  . Types: Cigarettes  . Quit date: 05/25/1968  Smokeless Tobacco  . Never Used   ROS: Per HPI   Objective:    BP 120/73   Pulse 76   Temp 97.6 F (36.4 C) (Oral)   Ht 5' (1.524 m)   Wt 136 lb 3.2 oz (61.8 kg)   BMI 26.60 kg/m   Wt Readings from Last 3 Encounters:  09/21/16 136 lb 3.2 oz (61.8 kg)  06/17/16 136 lb 3.2 oz (61.8 kg)  03/03/16 132 lb (59.9 kg)    Gen: NAD, alert, cooperative with exam, NCAT EYES: EOMI, no conjunctival injection, or no icterus CV: NRRR, normal S1/S2, III/VI RUSB systolic ejection murmur, distal pulses 2+ b/l Resp: CTABL, no wheezes, normal WOB Abd: +BS, soft, NTND.  Ext: No  edema, warm Neuro: Alert and oriented, strength equal b/l UE and LE, coordination grossly normal, sensation intact to touch, monofilament b/l feet MSK: raise arms over head b/l, ROM shoulders equal b/l Skin: R great toe with thickened, yellowed dark toenail  Assessment & Plan:  Kathryn Henson was seen today for follow-up, fatigue, hip weakness and headache.  Diagnoses and all orders for this visit:  Diabetes mellitus without complication (HCC) M4B 8.1, slightly elevated from goal Cont glimeperide Decrease sugary food intake Cont to check in the morning Foot exam today completed -     Microalbumin / creatinine urine ratio -     CMP14+EGFR -     Bayer DCA Hb A1c Waived  Hyperlipidemia, unspecified hyperlipidemia type Stable, on crestor, tolerating well -     Microalbumin / creatinine urine ratio -     CMP14+EGFR -     Bayer DCA Hb A1c Waived  Essential hypertension BP well controlled, cont current meds -     Microalbumin / creatinine urine ratio -     CMP14+EGFR -     Bayer DCA Hb A1c Waived  Post-menopausal Bone density due No falls since last visit, has had falls in past -     DG [facility] DEXA  Onychomycosis New problem Treat with below daily -     Efinaconazole 10 % SOLN; Apply 1 application topically daily.   Follow up  plan: Return in about 3 months (around 12/21/2016). Assunta Found, MD [facility]

## 2016-09-22 ENCOUNTER — Ambulatory Visit
Admission: RE | Admit: 2016-09-22 | Discharge: 2016-09-22 | Disposition: A | Discharge status: Home / Self Care | Payer: Medicare Other | Source: Ambulatory Visit | Attending: Pediatrics | Admitting: Pediatrics

## 2016-09-22 DIAGNOSIS — Z1231 Encounter for screening mammogram for malignant neoplasm of breast: Secondary | ICD-10-CM

## 2016-09-22 HISTORY — DX: Malignant neoplasm of unspecified site of unspecified female breast: C50.919

## 2016-09-22 HISTORY — DX: Personal history of irradiation: Z92.3

## 2016-09-22 LAB — HM DIABETES EYE EXAM

## 2016-09-22 LAB — CMP14+EGFR
ALK PHOS: 91 IU/L (ref 39–117)
ALT: 12 IU/L (ref 0–32)
AST: 15 IU/L (ref 0–40)
Albumin/Globulin Ratio: 1.5 (ref 1.2–2.2)
Albumin: 3.9 g/dL (ref 3.2–4.6)
BUN/Creatinine Ratio: 21 (ref 12–28)
BUN: 18 mg/dL (ref 10–36)
Bilirubin Total: 0.5 mg/dL (ref 0.0–1.2)
CO2: 27 mmol/L (ref 18–29)
CREATININE: 0.87 mg/dL (ref 0.57–1.00)
Calcium: 8.8 mg/dL (ref 8.7–10.3)
Chloride: 102 mmol/L (ref 96–106)
GFR calc Af Amer: 67 mL/min/{1.73_m2} (ref >59)
GFR calc non Af Amer: 58 mL/min/{1.73_m2} — ABNORMAL LOW (ref >59)
GLUCOSE: 179 mg/dL — AB (ref 65–99)
Globulin, Total: 2.6 g/dL (ref 1.5–4.5)
Potassium: 4.4 mmol/L (ref 3.5–5.2)
Sodium: 142 mmol/L (ref 134–144)
Total Protein: 6.5 g/dL (ref 6.0–8.5)

## 2016-09-22 LAB — MICROALBUMIN / CREATININE URINE RATIO
CREATININE, UR: 144.4 mg/dL
Microalb/Creat Ratio: 8.9 mg/g creat (ref 0.0–30.0)
Microalbumin, Urine: 12.9 ug/mL

## 2016-09-29 ENCOUNTER — Other Ambulatory Visit: Payer: Medicare Other

## 2016-10-03 NOTE — Progress Notes (Signed)
Has an appt in June will try to have Dexa done then

## 2016-10-08 ENCOUNTER — Ambulatory Visit: Payer: Medicare Other

## 2016-10-26 ENCOUNTER — Other Ambulatory Visit: Payer: Self-pay | Admitting: Family Medicine

## 2016-10-29 ENCOUNTER — Ambulatory Visit: Payer: Medicare Other | Admitting: Pediatrics

## 2016-10-30 ENCOUNTER — Ambulatory Visit (INDEPENDENT_AMBULATORY_CARE_PROVIDER_SITE_OTHER): Payer: Medicare Other | Admitting: Pediatrics

## 2016-10-30 ENCOUNTER — Telehealth: Payer: Self-pay | Admitting: *Deleted

## 2016-10-30 ENCOUNTER — Encounter: Payer: Self-pay | Admitting: Pediatrics

## 2016-10-30 VITALS — BP 138/86 | HR 68 | Temp 97.1°F | Ht 60.0 in | Wt 132.8 lb

## 2016-10-30 DIAGNOSIS — E119 Type 2 diabetes mellitus without complications: Secondary | ICD-10-CM | POA: Diagnosis not present

## 2016-10-30 NOTE — Progress Notes (Signed)
  Subjective:   Patient ID: Kathryn Henson, female    DOB: Mar 20, 1923, 83 y.o.   MRN: [id number] CC: Elevated BS (1 month,  180s-220s)  HPI: Kathryn Henson is a 83 y.o. female presenting for Elevated BS (1 month,  180s-220s)  Has had some tingling in her feet sometimes, not every night BGLs before eating breakfast/brunch are mostly 160s-200s One up to 220, one down to 100 Taking med daily Has been eating lots of fresh fruits Some candy, sweets when family brings it in [place] she could decrease   Relevant past medical, surgical, family and social history reviewed. Allergies and medications reviewed and updated. History  Smoking Status  . Former Smoker  . Types: Cigarettes  . Quit date: 05/25/1968  Smokeless Tobacco  . Never Used   ROS: Per HPI   Objective:    BP 138/86   Pulse 68   Temp 97.1 F (36.2 C) (Oral)   Ht 5' (1.524 m)   Wt 132 lb 12.8 oz (60.2 kg)   BMI 25.94 kg/m   Wt Readings from Last 3 Encounters:  10/30/16 132 lb 12.8 oz (60.2 kg)  09/21/16 136 lb 3.2 oz (61.8 kg)  06/17/16 136 lb 3.2 oz (61.8 kg)    Gen: NAD, alert, cooperative with exam, NCAT EYES: EOMI, no conjunctival injection, or no icterus ENT:   OP without erythema LYMPH: no cervical LAD CV: NRRR, normal S1/S2, no murmur, distal DP pulses 2+ b/l Resp: CTABL, no wheezes, normal WOB Abd: +BS, soft, NTND. no guarding or organomegaly Ext: No edema, warm Neuro: Alert and oriented, strength equal b/l UE and LE, coordination grossly normal, sensation intact b/l feet to touch, temperature, monofilament MSK: normal muscle bulk  Assessment & Plan:  Kathryn Henson was seen today for elevated bs.  Diagnoses and all orders for this visit:  Diabetes mellitus without complication ([place]) Last D5W 8.1 Has noticed morning fasting BGLs uptrending, eating more sweets One low of 100 Feeling well Tingling minimally bothering her, not every day Thinks she can decrease sugar intake If A1c uptrending next check  will change medication  Follow up plan: As scheduled in July Assunta Found, MD [place]

## 2016-10-30 NOTE — Telephone Encounter (Signed)
Error

## 2016-11-26 ENCOUNTER — Other Ambulatory Visit: Payer: Self-pay | Admitting: Family Medicine

## 2016-12-15 ENCOUNTER — Other Ambulatory Visit: Payer: Self-pay | Admitting: Family Medicine

## 2016-12-22 ENCOUNTER — Ambulatory Visit (INDEPENDENT_AMBULATORY_CARE_PROVIDER_SITE_OTHER): Payer: Medicare Other | Admitting: Pediatrics

## 2016-12-22 ENCOUNTER — Encounter: Payer: Self-pay | Admitting: Pediatrics

## 2016-12-22 VITALS — BP 133/79 | HR 66 | Temp 97.6°F | Ht 60.0 in | Wt 131.2 lb

## 2016-12-22 DIAGNOSIS — E119 Type 2 diabetes mellitus without complications: Secondary | ICD-10-CM | POA: Diagnosis not present

## 2016-12-22 DIAGNOSIS — I10 Essential (primary) hypertension: Secondary | ICD-10-CM | POA: Diagnosis not present

## 2016-12-22 DIAGNOSIS — E785 Hyperlipidemia, unspecified: Secondary | ICD-10-CM | POA: Diagnosis not present

## 2016-12-22 DIAGNOSIS — H9193 Unspecified hearing loss, bilateral: Secondary | ICD-10-CM | POA: Diagnosis not present

## 2016-12-22 LAB — BAYER DCA HB A1C WAIVED: HB A1C (BAYER DCA - WAIVED): 8 % — ABNORMAL HIGH (ref <7.0)

## 2016-12-22 NOTE — Progress Notes (Signed)
  Subjective:   Patient ID: Kathryn Henson, female    DOB: [birth year], 83 y.o.   MRN: [id number] CC: Follow-up (3 month) med problems HPI: Kathryn Henson is a 83 y.o. female presenting for Follow-up (3 month)  Feeling well, no complaints Here today with her daughter  DM2:  Avoiding sugary foods some Weight down some in the summer with the heat, hasnt been as hungry, no abd pain Stays active, working in the garden some  HTN: no CP, no lightheadedness or dizziness  HLD: stable, taking crestor daily  Trouble hearing low voices at home, wants to get hearing evaluated  Relevant past medical, surgical, family and social history reviewed. Allergies and medications reviewed and updated. History  Smoking Status  . Former Smoker  . Types: Cigarettes  . Quit date: 05/25/1968  Smokeless Tobacco  . Never Used   ROS: Per HPI   Objective:    BP 133/79   Pulse 66   Temp 97.6 F (36.4 C) (Oral)   Ht 5' (1.524 m)   Wt 131 lb 3.2 oz (59.5 kg)   BMI 25.62 kg/m   Wt Readings from Last 3 Encounters:  12/22/16 131 lb 3.2 oz (59.5 kg)  10/30/16 132 lb 12.8 oz (60.2 kg)  09/21/16 136 lb 3.2 oz (61.8 kg)    Gen: NAD, alert, cooperative with exam, NCAT EYES: EOMI, no conjunctival injection, or no icterus ENT: OP without erythema CV: NRRR, normal Z3/A0, II/VI systolic murmur, distal pulses 2+ b/l Resp: CTABL, no wheezes, normal WOB Abd: +BS, soft, NTND.  Ext: No edema, warm Neuro: Alert and oriented, strength equal b/l UE and LE, coordination grossly normal MSK: normal muscle bulk  Assessment & Plan:  Kathryn Henson was seen today for follow-up med problems  Diagnoses and all orders for this visit:  Diabetes mellitus without complication ([place]) T6A 8, cont current meds -     Bayer DCA Hb A1c Waived  Hearing problem of both ears -     Ambulatory referral to Audiology  Essential hypertension Adequate meds, cont current meds  Hyperlipidemia, unspecified hyperlipidemia type Stable,  cont meds  Follow up plan: Return in about 3 months (around 03/24/2017). Kathryn Found, MD [place]

## 2017-03-03 ENCOUNTER — Telehealth: Payer: Self-pay | Admitting: Pediatrics

## 2017-03-03 NOTE — Telephone Encounter (Signed)
This was referred to [facility] and they closed it out because patient wanted closer but did not let us know that  Put back in to try and gt an appt next door  Don't know if they take her insurance but will try

## 2017-03-08 ENCOUNTER — Other Ambulatory Visit: Payer: Self-pay | Admitting: Family Medicine

## 2017-03-08 ENCOUNTER — Other Ambulatory Visit: Payer: Self-pay | Admitting: Pediatrics

## 2017-03-11 ENCOUNTER — Ambulatory Visit: Payer: Medicare Other | Admitting: Family Medicine

## 2017-03-12 ENCOUNTER — Encounter: Payer: Self-pay | Admitting: Family Medicine

## 2017-03-12 ENCOUNTER — Ambulatory Visit (INDEPENDENT_AMBULATORY_CARE_PROVIDER_SITE_OTHER): Payer: Medicare Other | Admitting: Family Medicine

## 2017-03-12 VITALS — BP 134/76 | HR 72 | Temp 98.6°F | Ht 60.0 in | Wt 135.0 lb

## 2017-03-12 DIAGNOSIS — R42 Dizziness and giddiness: Secondary | ICD-10-CM

## 2017-03-12 DIAGNOSIS — E119 Type 2 diabetes mellitus without complications: Secondary | ICD-10-CM | POA: Diagnosis not present

## 2017-03-12 DIAGNOSIS — R55 Syncope and collapse: Secondary | ICD-10-CM | POA: Diagnosis not present

## 2017-03-12 DIAGNOSIS — R5381 Other malaise: Secondary | ICD-10-CM | POA: Diagnosis not present

## 2017-03-12 LAB — BAYER DCA HB A1C WAIVED: HB A1C (BAYER DCA - WAIVED): 8.2 % — ABNORMAL HIGH (ref <7.0)

## 2017-03-12 NOTE — Patient Instructions (Addendum)
I have placed labs to evaluate her electrolyte status, kidney function, to look for anemia, to assess thyroid function and to recheck her hemoglobin A1c.  They should be available on Monday and he will be contacted with the results.  As we discussed, if her mentation changes, she has a loss of consciousness, shortness of breath or chest pain, please seek immediate medical attention.  I recommend that she change positions slowly and allow herself time before getting up from voiding or having a bowel movement.  I have placed a referral to physical therapy, for gait and strength training.  Please call me if you do not hear from them within the next week.  I do recommend that she consider going back to [employer] sne[employer], this is a great program for older individuals seeking to stay active.   Vasovagal Syncope, Adult Syncope, which is commonly known as fainting or passing out, is a temporary loss of consciousness. It occurs when the blood flow to the brain is reduced. Vasovagal syncope, also called neurocardiogenic syncope, is a fainting spell that happens when blood flow to the brain is reduced because of a sudden drop in heart rate and blood pressure. Vasovagal syncope is usually harmless. However, you can get injured if you fall during a fainting spell. What are the causes? This condition is caused by a drop in heart rate and blood pressure, usually in response to a trigger. Many things and situations can trigger an episode, including:  Pain.  Fear.  The sight of blood. This may occur during medical procedures, such as when blood is being drawn from a vein.  Common activities, such as coughing, swallowing, stretching, or going to the bathroom.  Emotional stress.  Being in a confined space.  Prolonged standing, especially in a warm environment.  Lack of sleep or rest.  Not eating for a long time.  Not drinking enough liquids.  Recent illness.  Drinking alcohol.  Taking drugs that  affect blood pressure, such as marijuana, cocaine, opiates, or inhalants.  What are the signs or symptoms? Before a fainting episode, you may:  Feel dizzy or light-headed.  Become pale.  Sense that you are going to faint.  Feel like the room is spinning.  Only see directly ahead (tunnel vision).  Feel sick to your stomach (nauseous).  See spots.  Slowly lose vision.  Hear ringing in your ears.  Have a headache.  Feel warm and sweaty.  Feel a sensation of pins and needles.  During the fainting spell, you may twitch or make jerky movements. Fainting spells usually last no longer than a few minutes before you wake up. If you get up too quickly before your body can recover, you may faint again. How is this diagnosed? This condition is diagnosed based on your symptoms, your medical history, and a physical exam. Tests may be done to rule out other causes of fainting. Tests may include:  Blood tests.  Heart tests, such as an electrocardiogram (ECG), echocardiogram, or electrophysiology study.  A test to check your response to changes in position (tilt table test).  How is this treated? Usually, treatment is not needed for this condition. Your health care provider may suggest ways to help prevent fainting episodes. These may include:  Drinking additional fluids if you are exposed to a trigger.  Sitting or lying down if you notice signs that an episode is coming.  If your fainting spells continue, your health care provider may recommend that you:  Take medicines to  prevent fainting or to help reduce further episodes of fainting.  Do certain exercises.  Wear compression stockings.  Have surgery to place a pacemaker in your body (rare).  Follow these instructions at home:  Learn to identify the signs that an episode is coming.  Sit or lie down at the first sign of a fainting spell. If you sit down, put your head down between your legs. If you lie down, swing your legs  up in the air to increase blood flow to the brain.  Avoid hot tubs and saunas.  Avoid standing for a long time. If you have to stand for a long time, try: ? Crossing your legs. ? Flexing and stretching your leg muscles. ? Squatting. ? Moving your legs. ? Bending over.  Drink enough fluid to keep your urine clear or pale yellow.  Make changes to your diet that your health care provider recommends. You may be told to: ? Avoid caffeine. ? Eat more salt.  Take over-the-counter and prescription medicines only as told by your health care provider. Contact a health care provider if:  You continue to have fainting spells despite treatment.  You faint more often despite treatment.  You lose consciousness for more than a few minutes.  You faint during or after exercising or after being startled.  You have twitching or jerky movements for longer than a few seconds during a fainting spell.  You have an episode of twitching or jerky movements without fainting. Get help right away if:  A fainting spell leads to an injury or bleeding.  You have new symptoms that occur with the fainting spells, such as: ? Shortness of breath. ? Chest pain. ? Irregular heartbeat.  You twitch or make jerky movements for more than 5 minutes.  You twitch or make jerky movements during more than one fainting spell. This information is not intended to replace advice given to you by your health care provider. Make sure you discuss any questions you have with your health care provider. Document Released: 04/27/2012 Document Revised: 10/23/2015 Document Reviewed: 03/09/2015 Elsevier Interactive Patient Education  [employer].

## 2017-03-12 NOTE — Progress Notes (Signed)
Subjective: CC:"weak" PCP: Kathryn Maize, MD ATF:[id number] KIWANA Kathryn Henson is a 83 y.o. female, who is accompanied by [name] to today's appointment.  She is presenting to clinic today for:  1. Weakness Her daughter reports that patient had an episode of lightheadedness, decreased consciousness and nausea on Sunday during a bowel movement.  No actual loss of consciousness or falls.  She reports that patient slumped over and was less aware while seated on the commode.  EMS was immediately called and while they were on route, patient became more alert and was able to ambulate independently.  EMS noted normal vital signs and no acute abnormalities.  Patient declined transport to the hospital.  Her daughter reports that this is not the first episode.  This is occurred several times in the past.  Patient denies recent URI.  No bleeding, including hematochezia, melena, hematuria, vaginal bleeding.  She denies abdominal pain, vomiting.  She does feel somewhat more tired than normal.  Otherwise, symptoms have resolved.  Additionally, past medical history significant for pacemaker.  She notes that she regularly has this interrogated over the phone and is expecting to see [facility] provider in December for this.  2. Physical deconditioning Patient's daughter is somewhat concerned about physical deterioration of her mother.  She notes that she is fairly independent and resides alone, while her daughter lives next door.  She has not had any falls and is often able to perform most household chores on her own.  She is interested in getting her a home health aide a couple of hours per day.  Additionally, she wonders what strengthening exercises the patient can do to improve overall health.  Allergies  Allergen Reactions  . Atorvastatin     Other reaction(s): Myalgias (intolerance)  . Fluzone  [Flu Virus Vaccine] Swelling  . Influenza Vaccines Other (See Comments)    Aches at site for over a year.  .  Metformin Nausea Only  . Other     Other reaction(s): Swelling (ALLERGY/intolerance)  . Sulfa Antibiotics Other (See Comments)    unknown   Past Medical History:  Diagnosis Date  . Breast cancer ([place])   . Cancer ([place])    left breast  . Diabetes mellitus without complication ([place])   . Glaucoma   . Hyperlipidemia   . Hypertension   . Osteoporosis   . Pacemaker   . Personal history of radiation therapy 04/2001   Family History  Problem Relation Age of Onset  . Heart disease Mother   . Diabetes Mother   . Stroke Father   . Breast cancer Sister   . Breast cancer Daughter   . Breast cancer Sister   . Breast cancer Other     Current Outpatient Prescriptions:  .  ACCU-CHEK COMPACT PLUS test strip, USE TO CHECK GLUCOSE UP TO 4 TIMES DAILY AS DIRECTED, Disp: 100 each, Rfl: 0 .  ACCU-CHEK COMPACT PLUS test strip, USE TO CHECK GLUCOSE ONCE TO TWICE DAILY, Disp: 102 each, Rfl: 2 .  ACCU-CHEK SOFTCLIX LANCETS lancets, , Disp: , Rfl:  .  aspirin 81 MG tablet, Take 81 mg by mouth daily., Disp: , Rfl:  .  blood glucose meter kit and supplies KIT, Dispense based on patient and insurance preference. Use up to four times daily as directed. (FOR ICD-9 250.00, 250.01)., Disp: 1 each, Rfl: 0 .  Cholecalciferol (VITAMIN D-1000 MAX ST) 1000 UNITS tablet, Take 1,000 Units by mouth daily., Disp: , Rfl:  .  Coenzyme Q10 (  COQ-10) 200 MG CAPS, 200 mg. Take 200 mg by mouth daily., Disp: , Rfl:  .  glimepiride (AMARYL) 2 MG tablet, TAKE 1 TABLET BY MOUTH TWICE DAILY WITH MEALS, Disp: 180 tablet, Rfl: 0 .  hydrocortisone (ANUSOL-HC) 2.5 % rectal cream, Place 1 application rectally 2 (two) times daily., Disp: 30 g, Rfl: 0 .  lisinopril (PRINIVIL,ZESTRIL) 10 MG tablet, TAKE ONE TABLET BY MOUTH ONCE DAILY, Disp: 90 tablet, Rfl: 1 .  metoprolol tartrate (LOPRESSOR) 50 MG tablet, TAKE 1/2 (ONE-HALF) TABLET BY MOUTH TWICE DAILY, Disp: 180 tablet, Rfl: 0 .  nitroGLYCERIN (NITROSTAT) 0.4 MG SL tablet, Place 0.4 mg  under the tongue every 5 (five) minutes as needed for chest pain., Disp: , Rfl:  .  rosuvastatin (CRESTOR) 5 MG tablet, TAKE ONE TABLET BY MOUTH ONCE DAILY, Disp: 90 tablet, Rfl: 1  Social Hx: non smoker.  Health Maintenance: Flu shot  ROS: Per HPI  Objective: Office vital signs reviewed. BP 134/76   Pulse 72   Temp 98.6 F (37 C) (Oral)   Ht 5' (1.524 m)   Wt 135 lb (61.2 kg)   BMI 26.37 kg/m   Physical Examination:  General: Awake, alert, well appearing, elderly female, No acute distress HEENT: Normal    Neck: No masses palpated. No lymphadenopathy       Eyes: PERRLA, extraocular membranes intact, sclera white       Throat: moist mucus membranes Cardio: regular rate and rhythm, S1S2 heard, ?soft systolic murmur at the LSB appreciated Pulm: clear to auscultation bilaterally, no wheezes, rhonchi or rales; normal work of breathing on room air Ext: warm, no edema MSK: Ambulates independently.  Muscle tone fair. Neuro: follows commands, no focal deficits Psych: mood stable, speech normal, pleasant  Assessment/ Plan: 83 y.o. female   1. Postural dizziness with presyncope I suspect that this was likely vasovagal/post-mictural in nature.  She had no focal findings on today's exam.  For completion, will obtain basic metabolic labs to evaluate electrolytes and renal status.  CBC and TSH also collected.  I recommended that she change positions slowly, especially after voiding.  Will place referral for physical therapy to help with physical deconditioning as below.  Watch strict return precautions and reasons for emergent evaluation were reviewed with the patient and her daughter.  They voiced good understanding and will follow up with the primary care doctor as needed. - BMP8+EGFR - CBC - TSH  2. Diabetes mellitus without complication ([place]) We will obtain A1c with other labs.  Primary care provider to follow-up on this. - Bayer DCA Hb A1c Waived  3. Physical deconditioning I did  recommend to the patient that she consider restarting silver sneakers program. - Ambulatory referral to Physical Therapy   Orders Placed This Encounter  Procedures  . BMP8+EGFR  . CBC  . TSH  . Bayer DCA Hb A1c Waived  . Ambulatory referral to Physical Therapy    Referral Priority:   Routine    Referral Type:   Physical Medicine    Referral Reason:   Specialty Services Required    Requested Specialty:   Physical Therapy    Number of Visits Requested:   1   No orders of the defined types were placed in this encounter.    Kathryn Norlander, DO [place] [phone]

## 2017-03-13 LAB — BMP8+EGFR
BUN / CREAT RATIO: 19 (ref 12–28)
BUN: 26 mg/dL (ref 10–36)
CO2: 26 mmol/L (ref 20–29)
CREATININE: 1.34 mg/dL — AB (ref 0.57–1.00)
Calcium: 9.3 mg/dL (ref 8.7–10.3)
Chloride: 107 mmol/L — ABNORMAL HIGH (ref 96–106)
GFR, EST AFRICAN AMERICAN: 40 mL/min/{1.73_m2} — AB (ref >59)
GFR, EST NON AFRICAN AMERICAN: 34 mL/min/{1.73_m2} — AB (ref >59)
Glucose: 159 mg/dL — ABNORMAL HIGH (ref 65–99)
Potassium: 4.3 mmol/L (ref 3.5–5.2)
SODIUM: 143 mmol/L (ref 134–144)

## 2017-03-13 LAB — CBC
Hematocrit: 35.3 % (ref 34.0–46.6)
Hemoglobin: 12.2 g/dL (ref 11.1–15.9)
MCH: 31.5 pg (ref 26.6–33.0)
MCHC: 34.6 g/dL (ref 31.5–35.7)
MCV: 91 fL (ref 79–97)
PLATELETS: 170 10*3/uL (ref 150–379)
RBC: 3.87 x10E6/uL (ref 3.77–5.28)
RDW: 14 % (ref 12.3–15.4)
WBC: 4.2 10*3/uL (ref 3.4–10.8)

## 2017-03-13 LAB — TSH: TSH: 2.24 u[IU]/mL (ref 0.450–4.500)

## 2017-03-15 ENCOUNTER — Other Ambulatory Visit: Payer: Self-pay | Admitting: Family Medicine

## 2017-03-15 ENCOUNTER — Telehealth: Payer: Self-pay | Admitting: Pediatrics

## 2017-03-15 DIAGNOSIS — N179 Acute kidney failure, unspecified: Secondary | ICD-10-CM

## 2017-03-15 NOTE — Telephone Encounter (Signed)
Daughter aware of results and need to have mom's blood work repeated.

## 2017-03-17 ENCOUNTER — Other Ambulatory Visit: Payer: Medicare Other

## 2017-03-18 LAB — BMP8+EGFR
BUN/Creatinine Ratio: 27 (ref 12–28)
BUN: 28 mg/dL (ref 10–36)
CALCIUM: 8.8 mg/dL (ref 8.7–10.3)
CO2: 24 mmol/L (ref 20–29)
CREATININE: 1.03 mg/dL — AB (ref 0.57–1.00)
Chloride: 105 mmol/L (ref 96–106)
GFR, EST AFRICAN AMERICAN: 55 mL/min/{1.73_m2} — AB (ref >59)
GFR, EST NON AFRICAN AMERICAN: 47 mL/min/{1.73_m2} — AB (ref >59)
Glucose: 202 mg/dL — ABNORMAL HIGH (ref 65–99)
POTASSIUM: 4.4 mmol/L (ref 3.5–5.2)
Sodium: 142 mmol/L (ref 134–144)

## 2017-03-19 ENCOUNTER — Telehealth: Payer: Self-pay | Admitting: Pediatrics

## 2017-03-19 NOTE — Telephone Encounter (Signed)
Daughter [name]) aware of lab results

## 2017-03-25 ENCOUNTER — Ambulatory Visit: Payer: Medicare Other | Admitting: Pediatrics

## 2017-03-29 ENCOUNTER — Ambulatory Visit: Payer: Medicare Other | Attending: Family Medicine | Admitting: Physical Therapy

## 2017-04-27 ENCOUNTER — Encounter: Payer: Self-pay | Admitting: Family Medicine

## 2017-04-27 ENCOUNTER — Ambulatory Visit: Payer: Medicare Other | Admitting: Family Medicine

## 2017-04-27 VITALS — BP 135/87 | HR 76 | Temp 97.6°F | Ht 60.0 in | Wt 135.0 lb

## 2017-04-27 DIAGNOSIS — R05 Cough: Secondary | ICD-10-CM

## 2017-04-27 DIAGNOSIS — R0789 Other chest pain: Secondary | ICD-10-CM | POA: Diagnosis not present

## 2017-04-27 DIAGNOSIS — R059 Cough, unspecified: Secondary | ICD-10-CM

## 2017-04-27 MED ORDER — FLUTICASONE PROPIONATE 50 MCG/ACT NA SUSP: 2.0000 | Freq: Every day | NASAL | 6 refills | Status: DC

## 2017-04-27 MED ORDER — AZITHROMYCIN 250 MG PO TABS: ORAL_TABLET | ORAL | 0 refills | Status: DC

## 2017-04-27 NOTE — Progress Notes (Signed)
   HPI  Patient presents today for cough.  Patient explains cough has been persistent for about 7-10 days.  Patient states she is also had some chest discomfort, however this is been going on for over a year and usually happens when she is sitting down at night or lying down in bed.  She states that there are no exertional symptoms.  Patient initially describes cough as dry and throat clearing type cough with persistent congestion. She denies fever. She has no significant shortness of breath.  She is tolerating food and fluids like usual  PMH: Smoking status noted ROS: Per HPI  Objective: BP 135/87 (BP Location: Left Arm, Cuff Size: Normal)   Pulse 76   Temp 97.6 F (36.4 C) (Oral)   Ht 5' (1.524 m)   Wt 135 lb (61.2 kg)   BMI 26.37 kg/m  Gen: NAD, alert, cooperative with exam HEENT: NCAT, nares erythematous with moist boggy mucosa, oropharynx moist and clear, TMs normal bilaterally CV: RRR, good S1/S2, no murmur Resp: CTABL, no wheezes, non-labored, on return to room patient is coughing with a very deep chesty wet sounding cough Abd: SNTND, BS present, no guarding or organomegaly Ext: No edema, warm Neuro: Alert and oriented, No gross deficits  Assessment and plan:  #Cough Covering for atypical pneumonia, her lung exam is reassuring, however concerning. Given time course I think it is most prudent to go ahead and cover with azithromycin Low threshold for return After hours clinic, otherwise would pursue x-ray  #Chest discomfort Most likely GERD given symptoms long-term, associated with lying down at night and no exertional symptoms. Recommended very mild treatments like Tums or Maalox.   Meds ordered this encounter  Medications  . fluticasone (FLONASE) 50 MCG/ACT nasal spray    Sig: Place 2 sprays into both nostrils daily.    Dispense:  16 g    Refill:  6  . azithromycin (ZITHROMAX) 250 MG tablet    Sig: Take 2 tablets on day 1 and 1 tablet daily after that   Dispense:  6 tablet    Refill:  0    Laroy Apple, MD [facility] 04/27/2017, 6:42 PM

## 2017-04-27 NOTE — Patient Instructions (Signed)
Great to meet you!  Try Flonase 2 sprays per nostril once daily, allow 4-7 days for symptoms to begin improving.  Try Tums or Maalox for your chest discomfort. Please le t me know if it begins to bother you more.

## 2017-05-06 ENCOUNTER — Other Ambulatory Visit: Payer: Self-pay | Admitting: Pediatrics

## 2017-06-10 ENCOUNTER — Other Ambulatory Visit: Payer: Self-pay | Admitting: Family Medicine

## 2017-06-10 ENCOUNTER — Other Ambulatory Visit: Payer: Self-pay | Admitting: Pediatrics

## 2017-06-15 ENCOUNTER — Other Ambulatory Visit: Payer: Self-pay | Admitting: Pediatrics

## 2017-06-21 ENCOUNTER — Encounter: Payer: Self-pay | Admitting: Pediatrics

## 2017-06-21 ENCOUNTER — Ambulatory Visit: Payer: Medicare Other | Admitting: Pediatrics

## 2017-06-21 VITALS — BP 123/69 | HR 78 | Temp 97.8°F | Ht 60.0 in | Wt 134.0 lb

## 2017-06-21 DIAGNOSIS — R55 Syncope and collapse: Secondary | ICD-10-CM | POA: Diagnosis not present

## 2017-06-21 DIAGNOSIS — I10 Essential (primary) hypertension: Secondary | ICD-10-CM | POA: Diagnosis not present

## 2017-06-21 DIAGNOSIS — E118 Type 2 diabetes mellitus with unspecified complications: Secondary | ICD-10-CM | POA: Diagnosis not present

## 2017-06-21 LAB — BAYER DCA HB A1C WAIVED: HB A1C (BAYER DCA - WAIVED): 8.4 % — ABNORMAL HIGH (ref <7.0)

## 2017-06-21 NOTE — Patient Instructions (Addendum)
Check blood pressures at home in morning and evening Bring numbers to next clinic visit  Check blood sugars in the morning

## 2017-06-21 NOTE — Progress Notes (Signed)
  Subjective:   Patient ID: Kathryn Henson, female    DOB: September 14, 1922, 84 y.o.   MRN: [id number] CC: Hospitalization Follow-up (lost consciousness on Thursday went to ER at [facility])  HPI: Kathryn Henson is a 84 y.o. female presenting for Hospitalization Follow-up (lost consciousness on Thursday went to ER at [facility])  Was eating dinner with daughter Kathryn Henson when her daughter noticed her looking woozy. She threw up soon afterwards EMS called, was there within 5 min Says her Bp was low, doesn't remember how low, BGL was normal Here today with [name], says pt wasn't her normal self for several minutes after episode Pt remember riding in ambulance to hospital Her pacemaker was interrogated with no abnormal rhythms  HTN: not usually checking, has been checking since episode 139/83 last night  Dm2: checking BGLs daily 100 this morning  Feeling well and back to normal self now No weakness one side of body compared with other  Relevant past medical, surgical, family and social history reviewed. Allergies and medications reviewed and updated. Social History   Tobacco Use  Smoking Status Former Smoker  . Types: Cigarettes  . Last attempt to quit: 05/25/1968  . Years since quitting: 49.1  Smokeless Tobacco Never Used   ROS: Per HPI   Objective:    BP 123/69   Pulse 78   Temp 97.8 F (36.6 C) (Oral)   Ht 5' (1.524 m)   Wt 134 lb (60.8 kg)   BMI 26.17 kg/m   Wt Readings from Last 3 Encounters:  06/21/17 134 lb (60.8 kg)  04/27/17 135 lb (61.2 kg)  03/12/17 135 lb (61.2 kg)    Gen: NAD, alert, cooperative with exam, NCAT EYES: EOMI, no conjunctival injection, or no icterus ENT:  OP without erythema LYMPH: no cervical LAD CV: NRRR, normal T9/Q3, systolic ejection murmur at RUSB, radiation to carotids, distal pulses 2+ b/l Resp: CTABL, no wheezes, normal WOB Abd: +BS, soft, NTND.  Ext: No edema, warm Neuro: Alert and oriented, strength equal b/l UE and LE,  coordination grossly normal MSK: normal muscle bulk  Assessment & Plan:  Kathryn Henson was seen today for hospitalization follow-up.  Diagnoses and all orders for this visit:  Syncope, unspecified syncope type -     US Carotid Duplex Bilateral; Future  Type 2 diabetes mellitus with complication, without long-term current use of insulin ([place]) -     Bayer DCA Hb A1c Waived  Essential hypertension Check BPs at home, write down and bring back Hold for lisinopril for SBP < 120  Follow up plan: Return in about 4 weeks (around 07/19/2017). Assunta Found, MD [place]

## 2017-06-25 ENCOUNTER — Ambulatory Visit: Payer: Medicare Other | Admitting: Pediatrics

## 2017-06-25 ENCOUNTER — Ambulatory Visit
Admission: RE | Admit: 2017-06-25 | Discharge: 2017-06-25 | Disposition: A | Discharge status: Home / Self Care | Payer: Medicare Other | Source: Ambulatory Visit | Attending: Pediatrics | Admitting: Pediatrics

## 2017-06-25 DIAGNOSIS — R55 Syncope and collapse: Secondary | ICD-10-CM

## 2017-07-06 ENCOUNTER — Ambulatory Visit: Payer: Medicare Other | Admitting: Pediatrics

## 2017-07-09 ENCOUNTER — Telehealth: Payer: Self-pay | Admitting: Pediatrics

## 2017-07-09 NOTE — Telephone Encounter (Signed)
Daughter [name] aware of [place] results

## 2017-07-22 ENCOUNTER — Encounter: Payer: Self-pay | Admitting: Pediatrics

## 2017-07-22 ENCOUNTER — Ambulatory Visit: Payer: Medicare Other | Admitting: Pediatrics

## 2017-07-22 VITALS — BP 126/77 | HR 73 | Temp 97.4°F | Ht 60.0 in | Wt 133.0 lb

## 2017-07-22 DIAGNOSIS — I10 Essential (primary) hypertension: Secondary | ICD-10-CM | POA: Diagnosis not present

## 2017-07-22 DIAGNOSIS — E785 Hyperlipidemia, unspecified: Secondary | ICD-10-CM | POA: Diagnosis not present

## 2017-07-22 DIAGNOSIS — E119 Type 2 diabetes mellitus without complications: Secondary | ICD-10-CM | POA: Diagnosis not present

## 2017-07-22 LAB — BAYER DCA HB A1C WAIVED: HB A1C: 8 % — AB (ref <7.0)

## 2017-07-22 MED ORDER — METOPROLOL TARTRATE 50 MG PO TABS: ORAL_TABLET | ORAL | 1 refills | Status: DC

## 2017-07-22 MED ORDER — GLIMEPIRIDE 2 MG PO TABS: 2.0000 mg | ORAL_TABLET | Freq: Two times a day (BID) | ORAL | 1 refills | Status: DC

## 2017-07-22 MED ORDER — LISINOPRIL 10 MG PO TABS: ORAL_TABLET | ORAL | 0 refills | Status: DC

## 2017-07-22 MED ORDER — ROSUVASTATIN CALCIUM 5 MG PO TABS: 5.0000 mg | ORAL_TABLET | Freq: Every day | ORAL | 1 refills | Status: DC

## 2017-07-22 NOTE — Progress Notes (Signed)
  Subjective:   Patient ID: TAMMALA WEIDER, female    DOB: Aug 23, 1922, 84 y.o.   MRN: [id number] CC: Follow-up (3 month); Hypertension (Fluctuation); and Blood Sugar Problem (High)  HPI: DARIYAH GARDUNO is a 84 y.o. female presenting for Follow-up (3 month); Hypertension (Fluctuation); and Blood Sugar Problem ([place])  DM2: Ranging from low 100s up to the 200s.  When elevated in the morning she usually knows why she says based on what she ate the night before.  No lows.  Hypertension: Blood pressures range from systolics low 967E up to the 150s. She denies any further episodes of lightheadedness or dizziness.   Cardiomyopathy: She feels like she is able to do what she wants to do, no shortness of breath with activity  Hyperlipidemia: Tolerating statin well  Relevant past medical, surgical, family and social history reviewed. Allergies and medications reviewed and updated. Social History   Tobacco Use  Smoking Status Former Smoker  . Types: Cigarettes  . Last attempt to quit: 05/25/1968  . Years since quitting: 49.1  Smokeless Tobacco Never Used   ROS: Per HPI   Objective:    BP 126/77   Pulse 73   Temp (!) 97.4 F (36.3 C) (Oral)   Ht 5' (1.524 m)   Wt 133 lb (60.3 kg)   BMI 25.97 kg/m   Wt Readings from Last 3 Encounters:  07/22/17 133 lb (60.3 kg)  06/21/17 134 lb (60.8 kg)  04/27/17 135 lb (61.2 kg)    Gen: NAD, alert, cooperative with exam, NCAT EYES: EOMI, no conjunctival injection, or no icterus ENT:  TMs pearly gray b/l, OP without erythema LYMPH: no cervical LAD CV: NRRR, normal S1/S2, no murmur, distal pulses 2+ b/l Resp: CTABL, no wheezes, normal WOB Abd: +BS, soft, NTND. no guarding or organomegaly Ext: No edema, warm Neuro: Alert and oriented  Assessment & Plan:  Amarah was seen today for follow-up, hypertension and blood sugar problem.  Diagnoses and all orders for this visit:  Essential hypertension BPs fluctuate.  Continue lisinopril.  5 mg once  a day in the morning.  -     BMP8+EGFR -     Lipid panel  Diabetes mellitus without complication (HCC) L3Y improved, 8.0.  Continue current medicines. -     Bayer DCA Hb A1c Waived -     BMP8+EGFR -     Lipid panel  Hyperlipidemia, unspecified hyperlipidemia type Stable, continue statin -     BMP8+EGFR -     Lipid panel   Follow up plan: 3 mo Assunta Found, MD [place]

## 2017-07-23 LAB — BMP8+EGFR
BUN / CREAT RATIO: 31 — AB (ref 12–28)
BUN: 30 mg/dL (ref 10–36)
CHLORIDE: 107 mmol/L — AB (ref 96–106)
CO2: 26 mmol/L (ref 20–29)
Calcium: 8.9 mg/dL (ref 8.7–10.3)
Creatinine, Ser: 0.98 mg/dL (ref 0.57–1.00)
GFR calc non Af Amer: 50 mL/min/{1.73_m2} — ABNORMAL LOW (ref >59)
GFR, EST AFRICAN AMERICAN: 58 mL/min/{1.73_m2} — AB (ref >59)
GLUCOSE: 171 mg/dL — AB (ref 65–99)
POTASSIUM: 4.2 mmol/L (ref 3.5–5.2)
Sodium: 146 mmol/L — ABNORMAL HIGH (ref 134–144)

## 2017-07-23 LAB — LIPID PANEL
CHOLESTEROL TOTAL: 193 mg/dL (ref 100–199)
Chol/HDL Ratio: 2.9 ratio (ref 0.0–4.4)
HDL: 67 mg/dL (ref >39)
LDL Calculated: 111 mg/dL — ABNORMAL HIGH (ref 0–99)
Triglycerides: 73 mg/dL (ref 0–149)
VLDL Cholesterol Cal: 15 mg/dL (ref 5–40)

## 2017-09-29 ENCOUNTER — Telehealth: Payer: Self-pay | Admitting: Pediatrics

## 2017-09-29 NOTE — Telephone Encounter (Signed)
Need letter by 5/16- aware Dr. Evette Doffing will return to the office Monday. Please advise

## 2017-10-01 LAB — HM DIABETES EYE EXAM

## 2017-10-04 NOTE — Telephone Encounter (Signed)
Daughter states that patient is 84 years old is why she can not travel.  States that she does not want to come in to get a letter.

## 2017-10-04 NOTE — Telephone Encounter (Signed)
Called and discussed with daughter.  Patient has been more fatigued recently.  She has an appointment with me in a couple weeks for follow-up but does not think that she will be able to get through an airport in [place] right now.  Letter written.

## 2017-10-04 NOTE — Telephone Encounter (Signed)
Needs appointment to be seen.  What happened?  Why is she not able to travel?

## 2017-10-20 ENCOUNTER — Encounter: Payer: Self-pay | Admitting: Pediatrics

## 2017-10-20 ENCOUNTER — Ambulatory Visit: Payer: Medicare Other | Admitting: Pediatrics

## 2017-10-20 VITALS — BP 137/81 | HR 70 | Temp 97.8°F | Ht 60.0 in | Wt 134.6 lb

## 2017-10-20 DIAGNOSIS — N898 Other specified noninflammatory disorders of vagina: Secondary | ICD-10-CM

## 2017-10-20 DIAGNOSIS — E118 Type 2 diabetes mellitus with unspecified complications: Secondary | ICD-10-CM

## 2017-10-20 DIAGNOSIS — I10 Essential (primary) hypertension: Secondary | ICD-10-CM

## 2017-10-20 LAB — BAYER DCA HB A1C WAIVED: HB A1C (BAYER DCA - WAIVED): 8 % — ABNORMAL HIGH (ref <7.0)

## 2017-10-20 MED ORDER — FLUCONAZOLE 150 MG PO TABS: 150.0000 mg | ORAL_TABLET | Freq: Once | ORAL | 0 refills | Status: AC

## 2017-10-20 NOTE — Patient Instructions (Signed)
Replens-- feminine moisturizing product

## 2017-10-20 NOTE — Progress Notes (Signed)
  Subjective:   Patient ID: Kathryn Henson, female    DOB: [birth year], 84 y.o.   MRN: [id number] CC: Diabetes (3 month follow up); Hypertension; Hyperlipidemia; and Vaginal Itching (x 1 month )  HPI: Kathryn Henson is a 84 y.o. female  Here today with daughter and. Diabetes: Blood sugars in the morning up to 1 50-200 when she eats sugar the night before.  Checking regularly.  Hypertension: Taking medicine regularly.  No lightheadedness, fainting.  Hyperlipidemia: Taking medicine regularly.  Has had some vaginal irritation for the last month.  Sometimes with clear discharge.  No burning or dysuria.  Has not had this before.  No skin changes.  Relevant past medical, surgical, family and social history reviewed. Allergies and medications reviewed and updated. Social History   Tobacco Use  Smoking Status Former Smoker  . Types: Cigarettes  . Last attempt to quit: 05/25/1968  . Years since quitting: 49.4  Smokeless Tobacco Never Used   ROS: Per HPI   Objective:    BP 137/81   Pulse 70   Temp 97.8 F (36.6 C) (Oral)   Ht 5' (1.524 m)   Wt 134 lb 9.6 oz (61.1 kg)   BMI 26.29 kg/m   Wt Readings from Last 3 Encounters:  10/20/17 134 lb 9.6 oz (61.1 kg)  07/22/17 133 lb (60.3 kg)  06/21/17 134 lb (60.8 kg)    Gen: NAD, alert, cooperative with exam, NCAT EYES: EOMI, no conjunctival injection, or no icterus ENT:  OP without erythema LYMPH: no cervical LAD CV: NRRR, normal R7/E0, II/VI systolic ejection murmur at right upper sternal border.  distal pulses 2+ b/l Resp: CTABL, no wheezes, normal WOB Abd: +BS, soft, NTND. no guarding or organomegaly Ext: No edema, warm Neuro: Alert and oriented MSK: normal muscle bulk  Declines vaginal exam.  Assessment & Plan:  [name] was seen today for diabetes, hypertension, hyperlipidemia and vaginal itching.  Diagnoses and all orders for this visit:  Type 2 diabetes mellitus with complication, without long-term current use of  insulin (HCC) A1c 8.0.  Same from last check.  Will decrease sugary food intake.  Follow-up in 3 months. -     Microalbumin / creatinine urine ratio -     Bayer DCA Hb A1c Waived  Essential hypertension Stable, continue current medicines.  Vaginal irritation Declines exam.  Will do trial of fluconazole x1.  Can try over-the-counter feminine moisturizing products.  If not improving or any worsening in any skin changes, needs to be seen for exam.,   Follow up plan: Return in about 3 months (around 01/20/2018). Assunta Found, MD [place]

## 2017-11-17 ENCOUNTER — Other Ambulatory Visit: Payer: Self-pay | Admitting: *Deleted

## 2017-11-17 ENCOUNTER — Other Ambulatory Visit: Payer: Medicare Other

## 2017-11-17 ENCOUNTER — Other Ambulatory Visit: Payer: Self-pay | Admitting: Pediatrics

## 2017-11-17 DIAGNOSIS — Z1211 Encounter for screening for malignant neoplasm of colon: Secondary | ICD-10-CM

## 2017-11-19 LAB — FECAL OCCULT BLOOD, IMMUNOCHEMICAL: Fecal Occult Bld: NEGATIVE

## 2017-11-27 ENCOUNTER — Other Ambulatory Visit: Payer: Self-pay | Admitting: Pediatrics

## 2017-11-27 DIAGNOSIS — I10 Essential (primary) hypertension: Secondary | ICD-10-CM

## 2017-12-04 ENCOUNTER — Other Ambulatory Visit: Payer: Self-pay | Admitting: Pediatrics

## 2017-12-04 DIAGNOSIS — I10 Essential (primary) hypertension: Secondary | ICD-10-CM

## 2017-12-07 ENCOUNTER — Other Ambulatory Visit: Payer: Self-pay | Admitting: Pediatrics

## 2017-12-07 DIAGNOSIS — I10 Essential (primary) hypertension: Secondary | ICD-10-CM

## 2017-12-09 ENCOUNTER — Ambulatory Visit (INDEPENDENT_AMBULATORY_CARE_PROVIDER_SITE_OTHER): Payer: Medicare Other | Admitting: *Deleted

## 2017-12-09 ENCOUNTER — Encounter: Payer: Self-pay | Admitting: *Deleted

## 2017-12-09 ENCOUNTER — Other Ambulatory Visit: Payer: Self-pay | Admitting: Pediatrics

## 2017-12-09 VITALS — BP 122/65 | HR 72 | Ht 59.0 in | Wt 136.0 lb

## 2017-12-09 DIAGNOSIS — I10 Essential (primary) hypertension: Secondary | ICD-10-CM

## 2017-12-09 DIAGNOSIS — Z0000 Encounter for general adult medical examination without abnormal findings: Secondary | ICD-10-CM

## 2017-12-09 MED ORDER — FLUCONAZOLE 150 MG PO TABS: 150.0000 mg | ORAL_TABLET | Freq: Once | ORAL | 0 refills | Status: AC

## 2017-12-09 NOTE — Progress Notes (Addendum)
Subjective:   Kathryn Henson is a 84 y.o. female who presents for a Medicare Annual Wellness Visit. Kathryn Henson lives at home alone but her daughter lives next door. Her husband passed away in Jul 23, 2012. She has recently been trying to go through her belongings and declutter her home. Her son recently died from sarcoidosis at age 31. Kathryn Henson has a bachelor's degree and is a retired Automotive engineer.    Review of Systems    Patient reports that her overall physical health is unchanged compared to last year but that she has noticed that she is processing information a little slower.   Psych: Has been more sad/blue than usual recently. Can't think of a specific reason but said that it may be related to her husband and son's death. She seems to be thinking about that more. She does have good support with her daughter. She has some difficulty sleeping. Goes to bed around 12:00am and wakes up about 7:00. Most of the time she will go back to bed and sleep until 9:00 or 10:00.   Cardiac Risk Factors include: advanced age (>56mn, >>57women);diabetes mellitus;dyslipidemia;hypertension;sedentary lifestyle  Endocrine: mostly elevated blood sugar readings at home. Some fasting in the 200s. Feels ok.   Genitourinary: Vaginal itching for a few days. Has used monistat external cream. Same symptoms in F2024/02/29and one dose of Diflucan 1575mcleared it up. No dysuria or pain.   All other systems negative       Current Medications (verified) Outpatient Encounter Medications as of 12/09/2017  Medication Sig  . ACCU-CHEK COMPACT PLUS test strip USE TO CHECK GLUCOSE UP TO 4 TIMES DAILY AS DIRECTED  . ACCU-CHEK COMPACT PLUS test strip USE TO CHECK GLUCOSE ONCE TO TWICE DAILY  . ACCU-CHEK SOFTCLIX LANCETS lancets   . aspirin 81 MG tablet Take 81 mg by mouth daily.  . blood glucose meter kit and supplies KIT Dispense based on patient and insurance preference. Use up to four times daily as directed. (FOR  ICD-9 250.00, 250.01).  . Cholecalciferol (VITAMIN D-1000 MAX ST) 1000 UNITS tablet Take 1,000 Units by mouth daily.  . Coenzyme Q10 (COQ-10) 200 MG CAPS 200 mg. Take 200 mg by mouth daily.  . fluticasone (FLONASE) 50 MCG/ACT nasal spray Place 2 sprays into both nostrils daily.  . Marland Kitchenlimepiride (AMARYL) 2 MG tablet Take 1 tablet (2 mg total) by mouth 2 (two) times daily with a meal.  . hydrocortisone (ANUSOL-HC) 2.5 % rectal cream Place 1 application rectally 2 (two) times daily.  . Marland Kitchenisinopril (PRINIVIL,ZESTRIL) 10 MG tablet HOLD FOR BLOOD PRESSURE <130  . metoprolol tartrate (LOPRESSOR) 50 MG tablet TAKE 1/2 (ONE-HALF) TABLET BY MOUTH TWICE DAILY  . nitroGLYCERIN (NITROSTAT) 0.4 MG SL tablet Place 0.4 mg under the tongue every 5 (five) minutes as needed for chest pain.  . rosuvastatin (CRESTOR) 5 MG tablet Take 1 tablet (5 mg total) by mouth daily.  . fluconazole (DIFLUCAN) 150 MG tablet Take 1 tablet (150 mg total) by mouth once for 1 dose.   No facility-administered encounter medications on file as of 12/09/2017.     Allergies (verified) Atorvastatin; Fluzone  [flu virus vaccine]; Influenza vaccines; Metformin; Other; and Sulfa antibiotics   History: Past Medical History:  Diagnosis Date  . [facility]  . [place] ([facility]   left breast  . Diabetes mellitus without complication ([facility]  . Glaucoma   . Hyperlipidemia   . Hypertension   . Osteoporosis   .  Pacemaker   . Personal history of radiation therapy 04/2001   Past Surgical History:  Procedure Laterality Date  . BREAST BIOPSY Left 03/11/2001  . BREAST LUMPECTOMY Left 03/25/2001  . Lumpectomy left breast    . PACEMAKER INSERTION  04/08/2007   serial [id number], model 5826   Family History  Problem Relation Age of Onset  . Heart disease Mother   . Diabetes Mother   . Stroke Father   . Breast cancer Sister   . Breast cancer Daughter 52  . Sarcoidosis Son   . Breast cancer Sister   . Breast cancer Other    Social  History   Socioeconomic History  . Marital status: Widowed    Spouse name: Not on file  . Number of children: 3  . Years of education: 16  . Highest education level: Bachelor's degree (e.g., BA, AB, BS)  Occupational History  . Occupation: REtired    Comment: Research officer, political party Needs  . Financial resource strain: Not hard at all  . Food insecurity:    Worry: Never true    Inability: Never true  . Transportation needs:    Medical: No    Non-medical: No  Tobacco Use  . Smoking status: Former Smoker    Types: Cigarettes    Last attempt to quit: 05/25/1968    Years since quitting: 49.5  . Smokeless tobacco: Never Used  Substance and Sexual Activity  . Alcohol use: No  . Drug use: No  . Sexual activity: Not Currently  Lifestyle  . Physical activity:    Days per week: 3 days    Minutes per session: 10 min  . Stress: To some extent  Relationships  . Social connections:    Talks on phone: More than three times a week    Gets together: More than three times a week    Attends religious service: More than 4 times per year    Active member of club or organization: Yes    Attends meetings of clubs or organizations: More than 4 times per year    Relationship status: Widowed  Other Topics Concern  . Not on file  Social History Narrative  . Not on file    Tobacco Use No.  Clinical Intake:  Pre-visit preparation completed: No  Pain : No/denies pain     Nutritional Status: BMI of 19-24  Normal Diabetes: No  How often do you need to have someone help you when you read instructions, pamphlets, or other written materials from your doctor or pharmacy?: 2 - Rarely     Information entered by :: Chong Sicilian, RN   Activities of Daily Living In your present state of health, do you have any difficulty performing the following activities: 12/09/2017  Hearing? Y  Comment has some difficulty with hearing more than before  Vision? Y  Comment goes yearly  Difficulty concentrating  or making decisions? Y  Comment Has some trouble with recall and may repeat the same thing a couple days in a row  Walking or climbing stairs? Y  Comment uses a cane now for balance  Dressing or bathing? N  Doing errands, shopping? Y  Preparing Food and eating ? N  Using the Toilet? N  In the past six months, have you accidently leaked urine? N  Do you have problems with loss of bowel control? N  Managing your Medications? N  Comment uses  pill box  Managing your Finances? N  Some recent data might be hidden  Diet Typically eats 3 meals a day or more. Had salmon, rice, and salad for supper last night. She had a piece of candy around 7:30 and then a peanut butter and jelly sandwich before bed.   Exercise Current Exercise Habits: Home exercise routine(some chair exercises), Type of exercise: stretching;walking, Time (Minutes): 15, Frequency (Times/Week): 3, Weekly Exercise (Minutes/Week): 45, Intensity: Mild, Exercise limited by: orthopedic condition(s)(advnaced age)   Depression Screen PHQ 2/9 Scores 12/09/2017 10/20/2017 07/22/2017 06/21/2017 04/27/2017 03/12/2017 12/22/2016  PHQ - 2 Score 2 0 1 2 0 2 2  PHQ- 9 Score 6 - 4 6 - 4 5  Exception Documentation - - - - - - -   Patient has been more sad/blue recently. Cannot relate it to a particular problem or incident. Has been more socially withdrawn. Hasn't been to church in a month. Feels better when she does get out and socialize.   Fall Risk Fall Risk  12/09/2017 07/22/2017 06/21/2017 04/27/2017 03/12/2017  Falls in the past year? No No Yes No No  Number falls in past yr: - - 1 - -  Risk for fall due to : - - - - -    Safety Is the patient's home free of loose throw rugs in walkways, pet beds, electrical cords, etc?   yes      Grab bars in the bathroom? no-had suction cub handles but daughter was concerned about how long they would hold      [facility] shower? no      Shower Seat? no      Handrails on the stairs?   yes      Adequate  lighting?   yes  Patient Care Team: Kathryn Maize, MD as PCP - General (Pediatrics) Harlen Labs, MD as Referring Physician (Optometry) Zadie Rhine, Clent Demark, MD as Consulting Physician (Ophthalmology)  Hospitalizations, surgeries, and ER visits in previous 12 months No hospitalizations, ER visits, or surgeries this past year.  Objective:    Today's Vitals   12/09/17 1407  BP: 122/65  Pulse: 72  Weight: 136 lb (61.7 kg)  Height: _0  (1.499 m)   Body mass index is 27.47 kg/m.  Advanced Directives 12/09/2017 10/17/2014  Does Patient Have a Medical Advance Directive? Yes Yes  Type of Paramedic of [place];Living will [place];Living will  Does patient want to make changes to medical advance directive? No - Patient declined No - Patient declined  Copy of [place] in Chart? No - copy requested No - copy requested  Would patient like information on creating a medical advance directive? No - Patient declined -    Hearing/Vision  normal or No deficits noted during visit.  Cognitive Function: MMSE - Mini Mental State Exam 12/09/2017 10/17/2014  Orientation to time 4 5  Orientation to Place 5 5  Registration 3 3  Attention/ Calculation 5 5  Recall 2 2  Language- name 2 objects 2 2  Language- repeat 1 1  Language- follow 3 step command 3 3  Language- read & follow direction 1 1  Write a sentence 1 1  Copy design 1 1  Total score 28 29       Normal Cognitive Function Screening: Yes    Immunizations and Health Maintenance Immunization History  Administered Date(s) Administered  . PPD Test 03/03/2016  . Pneumococcal Polysaccharide-23 05/25/1994  . Td 11/22/2009   Health Maintenance Due  Topic Date Due  . DEXA SCAN  10/17/1989  Health Maintenance  Topic Date Due  . DEXA SCAN  10/17/1989  . HEMOGLOBIN A1C  04/22/2018  . OPHTHALMOLOGY EXAM  10/02/2018  . FOOT EXAM  10/21/2018  . TETANUS/TDAP   11/23/2019  . PNA vac Low Risk Adult  Discontinued        Assessment:   This is a routine wellness examination for Kathryn Henson.    Plan:    Goals    . Exercise 3x per week (30 min per time)    . Reduce sugar intake to X grams per day     Ripe fruits are higher in sugar        Health Maintenance Recommendations: declined vaccines. No recommendations at this time   Additional Screening Recommendations: Lung: Low Dose CT Chest recommended if Age 54-80 years, 30 pack-year currently smoking OR have quit w/in 15years. Patient does not qualify. Hepatitis C Screening recommended: no  Today's Orders No orders of the defined types were placed in this encounter.   Keep f/u with Kathryn Maize, MD and any other specialty appointments you may have Continue current medications Discussed vaginal itching with Dr Evette Doffing and Diflucan 115m #1 sent in to pharmacy. Schedule appointment if it does not resolve.  Continue to move carefully to avoid falls. Use assistive devices like a can or walker if needed. Aim for at least 150 minutes of moderate activity a week. This can be done with chair exercises if necessary. Read or work on puzzles daily Stay connected with friends and family. Increase socialization by going to church more often or attending club meetings more regularly.   I have personally reviewed and noted the following in the patient's chart:   . Medical and social history . Use of alcohol, tobacco or illicit drugs  . Current medications and supplements . Functional ability and status . Nutritional status . Physical activity . Advanced directives . List of other physicians . Hospitalizations, surgeries, and ER visits in previous 12 months . Vitals . Screenings to include cognitive, depression, and falls . Referrals and appointments  In addition, I have reviewed and discussed with patient certain preventive protocols, quality metrics, and best practice recommendations. A  written personalized care plan for preventive services as well as general preventive health recommendations were provided to patient.     KChong Sicilian RN   12/09/2017   I have reviewed and agree with the above AWV documentation.   CAssunta Found MD [facility] 12/09/2017, 4:47 PM

## 2017-12-09 NOTE — Patient Instructions (Signed)
  Kathryn Henson , Thank you for taking time to come for your Medicare Wellness Visit. I appreciate your ongoing commitment to your health goals. Please review the following plan we discussed and let me know if I can assist you in the future.   These are the goals we discussed: Goals    . Exercise 3x per week (30 min per time)    . Reduce sugar intake to X grams per day     Ripe fruits are higher in sugar       This is a list of the screening recommended for you and due dates:  Health Maintenance  Topic Date Due  . DEXA scan (bone density measurement)  10/17/1989  . Hemoglobin A1C  04/22/2018  . Eye exam for diabetics  10/02/2018  . Complete foot exam   10/21/2018  . Tetanus Vaccine  11/23/2019  . Pneumonia vaccines  Discontinued

## 2018-02-23 ENCOUNTER — Ambulatory Visit (INDEPENDENT_AMBULATORY_CARE_PROVIDER_SITE_OTHER): Payer: Medicare Other | Admitting: Pediatrics

## 2018-02-23 ENCOUNTER — Encounter: Payer: Self-pay | Admitting: Pediatrics

## 2018-02-23 VITALS — BP 134/88 | HR 73 | Temp 97.9°F | Ht 59.0 in | Wt 134.2 lb

## 2018-02-23 DIAGNOSIS — E119 Type 2 diabetes mellitus without complications: Secondary | ICD-10-CM | POA: Diagnosis not present

## 2018-02-23 DIAGNOSIS — E118 Type 2 diabetes mellitus with unspecified complications: Secondary | ICD-10-CM

## 2018-02-23 DIAGNOSIS — J069 Acute upper respiratory infection, unspecified: Secondary | ICD-10-CM | POA: Diagnosis not present

## 2018-02-23 DIAGNOSIS — B379 Candidiasis, unspecified: Secondary | ICD-10-CM

## 2018-02-23 DIAGNOSIS — N63 Unspecified lump in unspecified breast: Secondary | ICD-10-CM

## 2018-02-23 LAB — BAYER DCA HB A1C WAIVED: HB A1C: 8.4 % — AB (ref <7.0)

## 2018-02-23 MED ORDER — FLUCONAZOLE 150 MG PO TABS: 150.0000 mg | ORAL_TABLET | ORAL | 0 refills | Status: DC | PRN

## 2018-02-23 NOTE — Progress Notes (Signed)
Subjective:   Patient ID: Kathryn Henson, female    DOB: 23-Aug-1922, 84 y.o.   MRN: [id number] CC: Medical Management of Chronic Issues and Vaginal Discharge  HPI: [name] is a 84 y.o. female   Diabetes: Taking glimepiride twice a day.  Eating some sugary foods regularly.  Thinks she could cut back on that.  Left breast increased density: Has noticed that over the last several months to year.  No specific lumps or masses.  Last mammogram in May 2018.  She would like to avoid future mammograms if possible.  Daughter with history of breast cancer.  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  Vaginal discharge: Treated with Diflucan for yeast infection in July.  Symptoms improved, now for past week has had return of clear vaginal discharge.  No vaginal bleeding.  Some itching and irritation in the vaginal area over the last week as well.  Usually she is symptom-free.  Has had a scratchy throat some runny nose for the last couple days.  No fevers.  Appetite is fine.  Sometimes coughing, sometimes sneezing.  Relevant past medical, surgical, family and social history reviewed. Allergies and medications reviewed and updated. Social History   Tobacco Use  Smoking Status Former Smoker  . Types: Cigarettes  . Last attempt to quit: 05/25/1968  . Years since quitting: 49.7  Smokeless Tobacco Never Used   ROS: Per HPI   Objective:    BP 134/88   Pulse 73   Temp 97.9 F (36.6 C) (Oral)   Ht 4\' 11"  (1.499 m)   Wt 134 lb 3.2 oz (60.9 kg)   BMI 27.11 kg/m   Wt Readings from Last 3 Encounters:  02/23/18 134 lb 3.2 oz (60.9 kg)  12/09/17 136 lb (61.7 kg)  10/20/17 134 lb 9.6 oz (61.1 kg)    Gen: NAD, alert, cooperative with exam, NCAT EYES: EOMI, no conjunctival injection, or no icterus ENT:  TMs dull gray b/l, OP with mild erythema LYMPH: no cervical LAD CV: NRRR, normal S1/S2, no murmur, distal pulses  2+ b/l Resp: CTABL, no wheezes, normal WOB Abd: +BS, soft, NTND.  Ext: No edema, warm Neuro: Alert and oriented Breast: No defined masses palpable bilateral breasts  Assessment & Plan:  Dorri was seen today for medical management of chronic issues and vaginal discharge.  Diagnoses and all orders for this visit:  Type 2 diabetes mellitus with complication, without long-term current use of insulin (HCC) A1c 8.4.  Discussed options, increase medicines versus decreasing sugar intake.  Patient wants to try decreasing sugar intake at home first.  Follow-up in 3 months.  Continue current medicines. -     Bayer DCA Hb A1c Waived  Yeast infection Blood sugar levels been slightly elevated, will treat with below, help symptoms in the past.  If not improving let me know. -     fluconazole (DIFLUCAN) 150 MG tablet; Take 1 tablet (150 mg total) by mouth every three (3) days as needed.  Acute URI Discussed symptomatic care, can try 5 mg Zyrtec daily, nasal steroid such as Flonase daily.  Return precautions discussed.  Breast lump or mass Patient has noticed increased density left breast compared to usual over the last year.  No specific mass palpable on exam.  Recommended diagnostic mammogram.  Patient hesitant, wants to think about it.  Let me know if symptoms worsen, any new skin changes, new masses or pain  in the area.  Will repeat breast exam in future.  Follow up plan: Return in about 3 months (around 05/26/2018). Assunta Found, MD [place]

## 2018-02-23 NOTE — Addendum Note (Signed)
Addended by: Eustaquio Maize on: 02/23/2018 05:29 PM   Modules accepted: Orders

## 2018-03-03 ENCOUNTER — Other Ambulatory Visit: Payer: Self-pay | Admitting: Pediatrics

## 2018-03-03 DIAGNOSIS — I10 Essential (primary) hypertension: Secondary | ICD-10-CM

## 2018-03-31 ENCOUNTER — Encounter: Payer: Self-pay | Admitting: *Deleted

## 2018-04-08 ENCOUNTER — Other Ambulatory Visit: Payer: Self-pay | Admitting: Pediatrics

## 2018-04-08 ENCOUNTER — Other Ambulatory Visit: Payer: Self-pay | Admitting: Physician Assistant

## 2018-04-08 DIAGNOSIS — E785 Hyperlipidemia, unspecified: Secondary | ICD-10-CM

## 2018-04-08 NOTE — Progress Notes (Signed)
Her daughter called and she has been having low blood pressures while she is eating and feels weak and dizzy.  And the pressures have been in the 80s over 60s.  She does have sick sinus syndrome.  She is taking metoprolol 50 mg 1/2 tablet twice daily and lisinopril 10 mg 1 daily.  I have asked them to stop the lisinopril at this time.  And to have her blood pressure staying in the 120/80 range and higher.  They are to call back if the blood pressure gets too high.  And we may consider restarting lisinopril at 5 mg daily.

## 2018-05-05 ENCOUNTER — Other Ambulatory Visit: Payer: Self-pay | Admitting: Pediatrics

## 2018-05-05 DIAGNOSIS — E119 Type 2 diabetes mellitus without complications: Secondary | ICD-10-CM

## 2018-05-05 NOTE — Telephone Encounter (Signed)
Next OV 05/30/18

## 2018-05-26 ENCOUNTER — Ambulatory Visit: Payer: Medicare Other | Admitting: Pediatrics

## 2018-05-30 ENCOUNTER — Ambulatory Visit: Payer: Medicare Other | Admitting: Pediatrics

## 2018-06-07 ENCOUNTER — Encounter: Payer: Self-pay | Admitting: Pediatrics

## 2018-06-07 ENCOUNTER — Ambulatory Visit (INDEPENDENT_AMBULATORY_CARE_PROVIDER_SITE_OTHER): Payer: Medicare Other | Admitting: Pediatrics

## 2018-06-07 VITALS — BP 137/78 | HR 65 | Temp 97.4°F | Ht 59.0 in | Wt 134.6 lb

## 2018-06-07 DIAGNOSIS — E118 Type 2 diabetes mellitus with unspecified complications: Secondary | ICD-10-CM

## 2018-06-07 DIAGNOSIS — E785 Hyperlipidemia, unspecified: Secondary | ICD-10-CM | POA: Diagnosis not present

## 2018-06-07 DIAGNOSIS — I10 Essential (primary) hypertension: Secondary | ICD-10-CM

## 2018-06-07 LAB — BMP8+EGFR
BUN/Creatinine Ratio: 20 (ref 12–28)
BUN: 20 mg/dL (ref 10–36)
CALCIUM: 8.7 mg/dL (ref 8.7–10.3)
CHLORIDE: 105 mmol/L (ref 96–106)
CO2: 23 mmol/L (ref 20–29)
Creatinine, Ser: 1 mg/dL (ref 0.57–1.00)
GFR calc non Af Amer: 49 mL/min/{1.73_m2} — ABNORMAL LOW (ref >59)
GFR, EST AFRICAN AMERICAN: 56 mL/min/{1.73_m2} — AB (ref >59)
Glucose: 209 mg/dL — ABNORMAL HIGH (ref 65–99)
POTASSIUM: 4 mmol/L (ref 3.5–5.2)
Sodium: 142 mmol/L (ref 134–144)

## 2018-06-07 LAB — BAYER DCA HB A1C WAIVED: HB A1C: 9.3 % — AB (ref <7.0)

## 2018-06-07 MED ORDER — ROSUVASTATIN CALCIUM 5 MG PO TABS: 5.0000 mg | ORAL_TABLET | Freq: Every day | ORAL | 1 refills | Status: DC

## 2018-06-07 MED ORDER — SITAGLIPTIN PHOSPHATE 50 MG PO TABS: 50.0000 mg | ORAL_TABLET | Freq: Every day | ORAL | 1 refills | Status: DC

## 2018-06-07 MED ORDER — METOPROLOL TARTRATE 25 MG PO TABS: 12.5000 mg | ORAL_TABLET | Freq: Two times a day (BID) | ORAL | 3 refills | Status: DC

## 2018-06-07 MED ORDER — GLIMEPIRIDE 2 MG PO TABS: ORAL_TABLET | ORAL | 1 refills | Status: DC

## 2018-06-07 NOTE — Progress Notes (Signed)
  Subjective:   Patient ID: Kathryn Henson, female    DOB: 01-04-1923, 85 y.o.   MRN: [id number] CC: Medical Management of Chronic Issues  HPI: Kathryn Henson is a 85 y.o. female   Diabetes: Taking metformin and glimepiride regularly.  Patient has noticed blood sugars have been elevated over the last few weeks.  She says she could do better about not eating as many sweets as she does.  She has had some lightheadedness off and on over the last few weeks.  She notices it most when she stands up and starts moving.  A couple weeks ago felt like something got stuck in her mid chest, noticed it with swallowing.  Has since resolved.  Hyperlipidemia: Tolerating statin  Relevant past medical, surgical, family and social history reviewed. Allergies and medications reviewed and updated. Social History   Tobacco Use  Smoking Status Former Smoker  . Types: Cigarettes  . Last attempt to quit: 05/25/1968  . Years since quitting: 50.0  Smokeless Tobacco Never Used   ROS: Per HPI   Objective:    BP 137/78   Pulse 65   Temp (!) 97.4 F (36.3 C) (Oral)   Ht '4\' 11"'$  (1.499 m)   Wt 134 lb 9.6 oz (61.1 kg)   BMI 27.19 kg/m   Wt Readings from Last 3 Encounters:  06/07/18 134 lb 9.6 oz (61.1 kg)  02/23/18 134 lb 3.2 oz (60.9 kg)  12/09/17 136 lb (61.7 kg)    Gen: NAD, alert, cooperative with exam, NCAT EYES: EOMI, no conjunctival injection, or no icterus ENT:  OP without erythema LYMPH: no cervical LAD CV: NRRR, normal S1/S2, no murmur, distal pulses 2+ b/l Resp: CTABL, no wheezes, normal WOB Abd: +BS, soft, NTND.  Ext: No edema, warm Neuro: Alert and oriented, strength equal b/l UE and LE, coordination grossly normal  Assessment & Plan:  Rayn was seen today for medical management of chronic issues.  Diagnoses and all orders for this visit:  Type 2 diabetes mellitus with complication, without long-term current use of insulin (HCC) A1c up to 9.3.  Continue glimepiride.  Will add  Januvia.  Continue to work on decreasing sugary foods and beverages. -     Bayer DCA Hb A1c Waived -     Microalbumin / creatinine urine ratio -     glimepiride (AMARYL) 2 MG tablet; TAKE 1 TABLET BY MOUTH TWICE DAILY WITH A MEAL -     sitaGLIPtin (JANUVIA) 50 MG tablet; Take 1 tablet (50 mg total) by mouth daily.  Hyperlipidemia, unspecified hyperlipidemia type Stable, continue below -     rosuvastatin (CRESTOR) 5 MG tablet; Take 1 tablet (5 mg total) by mouth daily.  Essential hypertension With intermittent lightheadedness episodes, will try decreasing metoprolol to 12.5 mg twice daily.  Let me know if any worsening symptoms. -     metoprolol tartrate (LOPRESSOR) 25 MG tablet; Take 0.5 tablets (12.5 mg total) by mouth 2 (two) times daily. -     BMP8+EGFR   Follow up plan: Return in about 3 months (around 09/06/2018) for diabetes. Assunta Found, MD [place]

## 2018-06-08 LAB — MICROALBUMIN / CREATININE URINE RATIO
CREATININE, UR: 251.5 mg/dL
MICROALB/CREAT RATIO: 36.9 mg/g{creat} — AB (ref 0.0–30.0)
MICROALBUM., U, RANDOM: 92.7 ug/mL

## 2018-07-15 ENCOUNTER — Telehealth: Payer: Self-pay | Admitting: Pediatrics

## 2018-07-15 NOTE — Telephone Encounter (Signed)
What symptoms do you have? Was seen in January and still having dizziness. Wants CT scan done  How long have you been sick? month  Have you been seen for this problem? YES  If your provider decides to give you a prescription, which pharmacy would you like for it to be sent to? [facility] in [place]  Patient informed that this information will be sent to the clinical staff for review and that they should receive a follow up call.

## 2018-07-15 NOTE — Telephone Encounter (Signed)
Ann aware and appt made

## 2018-07-15 NOTE — Telephone Encounter (Signed)
She will need to be seen

## 2018-07-18 ENCOUNTER — Ambulatory Visit: Payer: Medicare Other | Admitting: Family

## 2018-07-18 ENCOUNTER — Encounter: Payer: Self-pay | Admitting: Family

## 2018-07-18 VITALS — BP 129/75 | HR 69 | Temp 97.2°F | Ht 59.0 in | Wt 136.6 lb

## 2018-07-18 DIAGNOSIS — H8110 Benign paroxysmal vertigo, unspecified ear: Secondary | ICD-10-CM | POA: Diagnosis not present

## 2018-07-18 DIAGNOSIS — R42 Dizziness and giddiness: Secondary | ICD-10-CM

## 2018-07-18 LAB — CBC WITH DIFFERENTIAL/PLATELET
BASOS ABS: 0 10*3/uL (ref 0.0–0.2)
BASOS: 1 %
EOS (ABSOLUTE): 0.1 10*3/uL (ref 0.0–0.4)
Eos: 2 %
Hematocrit: 34.4 % (ref 34.0–46.6)
Hemoglobin: 11.8 g/dL (ref 11.1–15.9)
Immature Grans (Abs): 0 10*3/uL (ref 0.0–0.1)
Immature Granulocytes: 0 %
Lymphocytes Absolute: 1.3 10*3/uL (ref 0.7–3.1)
Lymphs: 33 %
MCH: 31.2 pg (ref 26.6–33.0)
MCHC: 34.3 g/dL (ref 31.5–35.7)
MCV: 91 fL (ref 79–97)
MONOS ABS: 0.3 10*3/uL (ref 0.1–0.9)
Monocytes: 8 %
NEUTROS PCT: 56 %
Neutrophils Absolute: 2.2 10*3/uL (ref 1.4–7.0)
PLATELETS: 164 10*3/uL (ref 150–450)
RBC: 3.78 x10E6/uL (ref 3.77–5.28)
RDW: 13.2 % (ref 11.7–15.4)
WBC: 4 10*3/uL (ref 3.4–10.8)

## 2018-07-18 LAB — BMP8+EGFR
BUN/Creatinine Ratio: 16 (ref 12–28)
BUN: 15 mg/dL (ref 10–36)
CALCIUM: 8.7 mg/dL (ref 8.7–10.3)
CO2: 23 mmol/L (ref 20–29)
Chloride: 104 mmol/L (ref 96–106)
Creatinine, Ser: 0.96 mg/dL (ref 0.57–1.00)
GFR calc non Af Amer: 51 mL/min/{1.73_m2} — ABNORMAL LOW (ref >59)
GFR, EST AFRICAN AMERICAN: 59 mL/min/{1.73_m2} — AB (ref >59)
GLUCOSE: 167 mg/dL — AB (ref 65–99)
POTASSIUM: 4.1 mmol/L (ref 3.5–5.2)
Sodium: 142 mmol/L (ref 134–144)

## 2018-07-18 MED ORDER — MECLIZINE HCL 12.5 MG PO TABS: 12.5000 mg | ORAL_TABLET | Freq: Three times a day (TID) | ORAL | 0 refills | Status: DC | PRN

## 2018-07-18 NOTE — Patient Instructions (Signed)
Benign Positional Vertigo  Vertigo is the feeling that you or your surroundings are moving when they are not. Benign positional vertigo is the most common form of vertigo. This is usually a harmless condition (benign). This condition is positional. This means that symptoms are triggered by certain movements and positions.  This condition can be dangerous if it occurs while you are doing something that could cause harm to you or others. This includes activities such as driving or operating machinery.  What are the causes?  In many cases, the cause of this condition is not known. It may be caused by a disturbance in an area of the inner ear that helps your brain to sense movement and balance. This disturbance can be caused by:   Viral infection (labyrinthitis).   Head injury.   Repetitive motion, such as jumping, dancing, or running.  What increases the risk?  You are more likely to develop this condition if:   You are a woman.   You are 50 years of age or older.  What are the signs or symptoms?  Symptoms of this condition usually happen when you move your head or your eyes in different directions. Symptoms may start suddenly, and usually last for less than a minute. They include:   Loss of balance and falling.   Feeling like you are spinning or moving.   Feeling like your surroundings are spinning or moving.   Nausea and vomiting.   Blurred vision.   Dizziness.   Involuntary eye movement (nystagmus).  Symptoms can be mild and cause only minor problems, or they can be severe and interfere with daily life. Episodes of benign positional vertigo may return (recur) over time. Symptoms may improve over time.  How is this diagnosed?  This condition may be diagnosed based on:   Your medical history.   Physical exam of the head, neck, and ears.   Tests, such as:  ? MRI.  ? CT scan.  ? Eye movement tests. Your health care provider may ask you to change positions quickly while he or she watches you for symptoms  of benign positional vertigo, such as nystagmus. Eye movement may be tested with a variety of exams that are designed to evaluate or stimulate vertigo.  ? An electroencephalogram (EEG). This records electrical activity in your brain.  ? Hearing tests.  You may be referred to a health care provider who specializes in ear, nose, and throat (ENT) problems (otolaryngologist) or a provider who specializes in disorders of the nervous system (neurologist).  How is this treated?    This condition may be treated in a session in which your health care provider moves your head in specific positions to adjust your inner ear back to normal. Treatment for this condition may take several sessions. Surgery may be needed in severe cases, but this is rare.  In some cases, benign positional vertigo may resolve on its own in 2-4 weeks.  Follow these instructions at home:  Safety   Move slowly. Avoid sudden body or head movements or certain positions, as told by your health care provider.   Avoid driving until your health care provider says it is safe for you to do so.   Avoid operating heavy machinery until your health care provider says it is safe for you to do so.   Avoid doing any tasks that would be dangerous to you or others if vertigo occurs.   If you have trouble walking or keeping your balance, try using   a cane for stability. If you feel dizzy or unstable, sit down right away.   Return to your normal activities as told by your health care provider. Ask your health care provider what activities are safe for you.  General instructions   Take over-the-counter and prescription medicines only as told by your health care provider.   Drink enough fluid to keep your urine pale yellow.   Keep all follow-up visits as told by your health care provider. This is important.  Contact a health care provider if:   You have a fever.   Your condition gets worse or you develop new symptoms.   Your family or friends notice any  behavioral changes.   You have nausea or vomiting that gets worse.   You have numbness or a "pins and needles" sensation.  Get help right away if you:   Have difficulty speaking or moving.   Are always dizzy.   Faint.   Develop severe headaches.   Have weakness in your legs or arms.   Have changes in your hearing or vision.   Develop a stiff neck.   Develop sensitivity to light.  Summary   Vertigo is the feeling that you or your surroundings are moving when they are not. Benign positional vertigo is the most common form of vertigo.   The cause of this condition is not known. It may be caused by a disturbance in an area of the inner ear that helps your brain to sense movement and balance.   Symptoms include loss of balance and falling, feeling that you or your surroundings are moving, nausea and vomiting, and blurred vision.   This condition can be diagnosed based on symptoms, physical exam, and other tests, such as MRI, CT scan, eye movement tests, and hearing tests.   Follow safety instructions as told by your health care provider. You will also be told when to contact your health care provider in case of problems.  This information is not intended to replace advice given to you by your health care provider. Make sure you discuss any questions you have with your health care provider.  Document Released: 02/16/2006 Document Revised: 10/20/2017 Document Reviewed: 10/20/2017  Elsevier Interactive Patient Education  2019 [employer]

## 2018-07-18 NOTE — Progress Notes (Signed)
Subjective:    Patient ID: Kathryn Henson, female    DOB: 01-26-1923, 85 y.o.   MRN: [id number]  Chief Complaint  Patient presents with  . Dizziness   PT presents to the office today for dizziness that started 4-6 weeks ago. She is followed by a Cardiologists every 6 months for hx of syncope and has pacemaker in place. She states this dizziness is different from her syncope epsidoes.  Dizziness  This is a new problem. The current episode started more than 1 month ago. The problem occurs intermittently. The problem has been gradually worsening. Pertinent negatives include no chills, coughing, diaphoresis, nausea, rash, sore throat, urinary symptoms, vomiting or weakness. The symptoms are aggravated by standing and bending. She has tried rest for the symptoms. The treatment provided mild relief.      Review of Systems  Constitutional: Negative for chills and diaphoresis.  HENT: Negative for sore throat.   Respiratory: Negative for cough.   Gastrointestinal: Negative for nausea and vomiting.  Skin: Negative for rash.  Neurological: Positive for dizziness. Negative for weakness.  All other systems reviewed and are negative.      Objective:   Physical Exam Vitals signs reviewed.  Constitutional:      General: She is not in acute distress.    Appearance: She is well-developed.  HENT:     Head: Normocephalic and atraumatic.     Right Ear: Tympanic membrane normal.     Left Ear: Tympanic membrane normal.  Eyes:     Pupils: Pupils are equal, round, and reactive to light.  Neck:     Musculoskeletal: Normal range of motion and neck supple.     Thyroid: No thyromegaly.  Cardiovascular:     Rate and Rhythm: Normal rate and regular rhythm.     Heart sounds: Normal heart sounds. No murmur.  Pulmonary:     Effort: Pulmonary effort is normal. No respiratory distress.     Breath sounds: Normal breath sounds. No wheezing.  Abdominal:     General: Bowel sounds are normal. There is no  distension.     Palpations: Abdomen is soft.     Tenderness: There is no abdominal tenderness.  Musculoskeletal:        General: No tenderness.     Comments: Generalized weakness, using cane  Skin:    General: Skin is warm and dry.  Neurological:     Mental Status: She is alert and oriented to person, place, and time.     Cranial Nerves: No cranial nerve deficit.     Deep Tendon Reflexes: Reflexes are normal and symmetric.  Psychiatric:        Behavior: Behavior normal.        Thought Content: Thought content normal.        Judgment: Judgment normal.       BP 129/75   Pulse 69   Temp (!) 97.2 F (36.2 C) (Oral)   Ht _0  (1.499 m)   Wt 136 lb 9.6 oz (62 kg)   BMI 27.59 kg/m      Assessment & Plan:  Kathryn Henson comes in today with chief complaint of Dizziness   Diagnosis and orders addressed:  1. Dizziness - CBC with Differential/Platelet - BMP8+EGFR - meclizine (ANTIVERT) 12.5 MG tablet; Take 1 tablet (12.5 mg total) by mouth 3 (three) times daily as needed for dizziness.  Dispense: 60 tablet; Refill: 0  2. Benign paroxysmal positional vertigo, unspecified laterality Labs pending  Fall preventions  discussed Epley exercises discussed- Handout given If dizziness continues will do referral to ENT - CBC with Differential/Platelet - BMP8+EGFR - meclizine (ANTIVERT) 12.5 MG tablet; Take 1 tablet (12.5 mg total) by mouth 3 (three) times daily as needed for dizziness.  Dispense: 60 tablet; Refill: 0   Evelina Dun, FNP

## 2018-07-24 ENCOUNTER — Other Ambulatory Visit: Payer: Self-pay | Admitting: Pediatrics

## 2018-07-24 DIAGNOSIS — I10 Essential (primary) hypertension: Secondary | ICD-10-CM

## 2018-09-06 ENCOUNTER — Ambulatory Visit (INDEPENDENT_AMBULATORY_CARE_PROVIDER_SITE_OTHER): Payer: Medicare Other | Admitting: Family Medicine

## 2018-09-06 ENCOUNTER — Other Ambulatory Visit: Payer: Self-pay

## 2018-09-06 ENCOUNTER — Encounter: Payer: Self-pay | Admitting: Family Medicine

## 2018-09-06 DIAGNOSIS — E1169 Type 2 diabetes mellitus with other specified complication: Secondary | ICD-10-CM

## 2018-09-06 DIAGNOSIS — E1159 Type 2 diabetes mellitus with other circulatory complications: Secondary | ICD-10-CM

## 2018-09-06 DIAGNOSIS — E118 Type 2 diabetes mellitus with unspecified complications: Secondary | ICD-10-CM

## 2018-09-06 DIAGNOSIS — E785 Hyperlipidemia, unspecified: Secondary | ICD-10-CM

## 2018-09-06 DIAGNOSIS — I10 Essential (primary) hypertension: Secondary | ICD-10-CM | POA: Diagnosis not present

## 2018-09-06 DIAGNOSIS — I152 Hypertension secondary to endocrine disorders: Secondary | ICD-10-CM

## 2018-09-06 MED ORDER — METOPROLOL TARTRATE 25 MG PO TABS: 25.0000 mg | ORAL_TABLET | Freq: Two times a day (BID) | ORAL | 3 refills | Status: DC

## 2018-09-06 MED ORDER — GLIMEPIRIDE 2 MG PO TABS: ORAL_TABLET | ORAL | 1 refills | Status: DC

## 2018-09-06 MED ORDER — ROSUVASTATIN CALCIUM 5 MG PO TABS: 5.0000 mg | ORAL_TABLET | Freq: Every day | ORAL | 1 refills | Status: DC

## 2018-09-06 NOTE — Progress Notes (Signed)
Virtual Visit via telephone Note Due to COVID-19, visit is conducted virtually and was requested by patient.  I connected with Kathryn Henson on 09/06/18 at [facility] by telephone and verified that I am speaking with the correct person using two identifiers. Kathryn Henson is currently located at home and daughter is currently with them during visit. The provider, Monia Pouch, FNP is located in their office at time of visit.  I discussed the limitations, risks, security and privacy concerns of performing an evaluation and management service by telephone and the availability of in person appointments. I also discussed with the patient that there may be a patient responsible charge related to this service. The patient expressed understanding and agreed to proceed.  Subjective:  Patient ID: Kathryn Henson, female    DOB: 1923/03/22, 85 y.o.   MRN: [id number]  Chief Complaint:  Medical Management of Chronic Issues   HPI: Kathryn Henson is a 85 y.o. female presenting on 09/06/2018 for Medical Management of Chronic Issues:  Medical Management of Chronic Issues   HPI: Kathryn Henson is a 85 y.o. female presenting on 09/06/2018 for Medical Management of Chronic Issues   1. Hypertension associated with diabetes ([place]) Complaint with meds - No, pts daughter states the pt has had a few syncopal episodes in the evening after her afternoon metoprolol dose. States she had to go to the ED for the last event. States she has since stopped her evening dose of metoprolol. States she has only been giving her 25 mg in the morning. States she is doing well with this adjustment.  Checking BP at home ranging 148/80 Exercising Regularly - No Watching Salt intake - Yes Pertinent ROS:  Headache - No Chest pain - No Dyspnea - No Palpitations - No LE edema - Yes They report good compliance with medications and can restate their regimen by memory. No medication side effects.  BP Readings from Last 3 Encounters:  07/18/18 129/75  06/07/18 137/78  02/23/18 134/88     2. Hyperlipidemia associated with type 2 diabetes mellitus ([place]) Compliant with  medications. No associated side effects. Does try to watch diet. Does not exercise on a regular basis.   3. Type 2 diabetes mellitus with complication, without long-term current use of insulin ([facility]) Diabetes mellitus 2 Compliant with meds - No. Pt was to initiate Januvia at last appointment with Dr. Evette Doffing on 06/07/2018 due to a Hgb A1C of 9.3. Pts daughter states she did not initiate this medication. States her mothers BS runs around 150-180 on a consistent basis. States she did not want to risk adding a new medication at her mothers age due to the possible adverse effects. She states her mother is doing well with one medication.  Checking CBGs? Yes  Fasting avg - 103  Postprandial average - 160 Exercising regularly? - No Watching carbohydrate intake? - Yes Neuropathy ? - No Hypoglycemic events - No Pertinent ROS:  Polyuria - No Polydipsia - No Vision problems - No   Relevant past medical, surgical, family, and social history reviewed and updated as indicated.  Allergies and medications reviewed and updated.   Past Medical History:  Diagnosis Date  . Breast cancer ([place])   . Cancer ([place])    left breast  . Diabetes mellitus without complication ([place])   . Glaucoma   . Hyperlipidemia   . Hypertension   . Osteoporosis   . Pacemaker   . Personal history of radiation therapy 04/2001    Past Surgical History:  Procedure Laterality Date  . BREAST BIOPSY Left 03/11/2001  . BREAST LUMPECTOMY Left 03/25/2001  . Lumpectomy left breast    .  PACEMAKER INSERTION  04/08/2007   serial [id number], model [id number]    Social History   Socioeconomic History  . Marital status: Widowed    Spouse name: Not on file  . Number of children: 3  . Years of education: 16  . Highest education level: Bachelor's degree (e.g., BA, AB, BS)  Occupational History  . Occupation: REtired    Comment: Research officer, political party Needs  . Financial resource strain: Not hard at all  . Food insecurity:    Worry: Never  true    Inability: Never true  . Transportation needs:    Medical: No    Non-medical: No  Tobacco Use  . Smoking status: Former Smoker    Types: Cigarettes    Last attempt to quit: 05/25/1968    Years since quitting: 50.3  . Smokeless tobacco: Never Used  Substance and Sexual Activity  . Alcohol use: No  . Drug use: No  . Sexual activity: Not Currently  Lifestyle  . Physical activity:    Days per week: 3 days    Minutes per session: 10 min  . Stress: To some extent  Relationships  . Social connections:    Talks on phone: More than three times a week    Gets together: More than three times a week    Attends religious service: More than 4 times per year    Active member of club or organization: Yes    Attends meetings of clubs or organizations: More than 4 times per year    Relationship status: Widowed  . Intimate partner violence:    Fear of current or ex partner: No    Emotionally abused: No    Physically abused: No    Forced sexual activity: No  Other Topics Concern  . Not on file  Social History Narrative  . Not on file    Outpatient Encounter Medications as of 09/06/2018  Medication Sig  . ACCU-CHEK COMPACT PLUS test strip USE TO CHECK GLUCOSE UP TO 4 TIMES DAILY AS DIRECTED  . ACCU-CHEK COMPACT PLUS test strip USE TO CHECK GLUCOSE ONCE TO TWICE DAILY  . ACCU-CHEK SOFTCLIX LANCETS lancets   . aspirin 81 MG tablet Take 81 mg by mouth daily.  . blood glucose meter kit and supplies KIT Dispense based on patient and insurance preference. Use up to four times daily as directed. (FOR ICD-9 250.00, 250.01).  . Cholecalciferol (VITAMIN D-1000 MAX ST) 1000 UNITS tablet Take 1,000 Units by mouth daily.  . Coenzyme Q10 (COQ-10) 200 MG CAPS 200 mg. Take 200 mg by mouth daily.  . fluticasone (FLONASE) 50 MCG/ACT nasal spray Place 2 sprays into both nostrils daily.  Marland Kitchen glimepiride (AMARYL) 2 MG tablet TAKE 1 TABLET BY MOUTH TWICE DAILY WITH A MEAL  . hydrocortisone (ANUSOL-HC) 2.5  % rectal cream Place 1 application rectally 2 (two) times daily.  . meclizine (ANTIVERT) 12.5 MG tablet Take 1 tablet (12.5 mg total) by mouth 3 (three) times daily as needed for dizziness.  . metoprolol tartrate (LOPRESSOR) 25 MG tablet Take 1 tablet (25 mg total) by mouth 2 (two) times daily.  . nitroGLYCERIN (NITROSTAT) 0.4 MG SL tablet Place 0.4 mg under the tongue every 5 (five) minutes as needed for chest pain.  . rosuvastatin (CRESTOR) 5 MG tablet Take 1 tablet (5 mg total) by mouth daily.  . [DISCONTINUED] glimepiride (AMARYL) 2 MG tablet TAKE 1 TABLET BY MOUTH TWICE DAILY WITH A MEAL  . [DISCONTINUED] metoprolol tartrate (LOPRESSOR) 50 MG tablet  Take 1/2 (one-half) tablet by mouth twice daily  . [DISCONTINUED] rosuvastatin (CRESTOR) 5 MG tablet Take 1 tablet (5 mg total) by mouth daily.  . [DISCONTINUED] sitaGLIPtin (JANUVIA) 50 MG tablet Take 1 tablet (50 mg total) by mouth daily.   No facility-administered encounter medications on file as of 09/06/2018.     Allergies  Allergen Reactions  . Atorvastatin     Other reaction(s): Myalgias (intolerance)  . Fluzone  [Flu Virus Vaccine] Swelling  . Influenza Vaccines Other (See Comments)    Aches at site for over a year.  . Metformin Nausea Only  . Other     Other reaction(s): Swelling (ALLERGY/intolerance)  . Sulfa Antibiotics Other (See Comments)    unknown    Review of Systems  Constitutional: Negative for activity change, appetite change, chills, fatigue, fever and unexpected weight change.  Respiratory: Negative for cough, choking, shortness of breath and wheezing.   Gastrointestinal: Positive for blood in stool (hemorrhoid). Negative for abdominal distention, abdominal pain, anal bleeding, constipation, diarrhea, nausea, rectal pain and vomiting.  Endocrine: Negative for polydipsia, polyphagia and polyuria.  Genitourinary: Negative for decreased urine volume and difficulty urinating.  Musculoskeletal: Negative for arthralgias  and myalgias.  Neurological: Positive for dizziness, syncope and light-headedness. Negative for tremors, seizures, facial asymmetry, speech difficulty, weakness, numbness and headaches.  Psychiatric/Behavioral: Negative for confusion.  All other systems reviewed and are negative.        Observations/Objective: No vital signs or physical exam, this was a telephone or virtual health encounter.  Pt alert and oriented, answers all questions appropriately, and able to speak in full sentences.    Assessment and Plan: Zamzam was seen today for medical management of chronic issues.  Diagnoses and all orders for this visit:  Hypertension associated with diabetes ([place]) Due to syncopal episodes, will decrease metoprolol to 25 mg once daily. Report any persistent high or low BP readings.  -     metoprolol tartrate (LOPRESSOR) 25 MG tablet; Take 1 tablet (25 mg total) by mouth 2 (two) times daily.  Hyperlipidemia associated with type 2 diabetes mellitus ([place]) Compliant with medications. Tolerating well. Continue below.  -     rosuvastatin (CRESTOR) 5 MG tablet; Take 1 tablet (5 mg total) by mouth daily.  Type 2 diabetes mellitus with complication, without long-term current use of insulin ([facility]) Did not initiate Januvia as directed by Dr. Evette Doffing, daughter did not feel this was safe. Reports ok blood sugar readings at home. Is avoiding sugary foods and beverages. Will recheck in 3 months. Report any persistent high or low BS readings.  -     glimepiride (AMARYL) 2 MG tablet; TAKE 1 TABLET BY MOUTH TWICE DAILY WITH A MEAL     Follow Up Instructions: Return in about 3 months (around 12/06/2018), or if symptoms worsen or fail to improve, for DM, HTN, Hyperlipidemia.    I discussed the assessment and treatment plan with the patient. The patient was provided an opportunity to ask questions and all were answered. The patient agreed with the plan and demonstrated an understanding of the instructions.    The patient was advised to call back or seek an in-person evaluation if the symptoms worsen or if the condition fails to improve as anticipated.  The above assessment and management plan was discussed with the patient. The patient verbalized understanding of and has agreed to the management plan. Patient is aware to call the clinic if symptoms persist or worsen. Patient is aware when to return to  the clinic for a follow-up visit. Patient educated on when it is appropriate to go to the emergency department.    I provided 15 minutes of non-face-to-face time during this encounter. The call started at 0915. The call ended at 0930.   Monia Pouch, FNP-C [facility] [address] [place], [place] [address] [phone]

## 2018-09-30 ENCOUNTER — Other Ambulatory Visit: Payer: Self-pay

## 2018-09-30 ENCOUNTER — Ambulatory Visit (INDEPENDENT_AMBULATORY_CARE_PROVIDER_SITE_OTHER): Payer: Medicare Other | Admitting: Family

## 2018-09-30 ENCOUNTER — Encounter: Payer: Self-pay | Admitting: Family

## 2018-09-30 DIAGNOSIS — K921 Melena: Secondary | ICD-10-CM

## 2018-09-30 DIAGNOSIS — K649 Unspecified hemorrhoids: Secondary | ICD-10-CM

## 2018-09-30 NOTE — Progress Notes (Signed)
   Virtual Visit via telephone Note  I connected with Kathryn Henson on 09/30/18 at 11:16 by telephone and verified that I am speaking with the correct person using two identifiers. Kathryn Henson is currently located at home and daughter is currently with her during visit. The provider, Evelina Dun, FNP is located in their office at time of visit.  I discussed the limitations, risks, security and privacy concerns of performing an evaluation and management service by telephone and the availability of in person appointments. I also discussed with the patient that there may be a patient responsible charge related to this service. The patient expressed understanding and agreed to proceed.   History and Present Illness:  HPI PT and daughter calls today with complaints of hemorrhoids. States she has had hemorrhoids on and off throughout the years.   States yesterday she noticed some bright red blood after a BM on her toilet paper and in the commode. Denies any pain. States she feels a small nodule on rectum when she wipes. Denies any constipation. Has not taken any OTC medications.    ROS   Observations/Objective: No SOB or distress noted, B/P-135/88, HR-79,  Glucose- 108  Assessment and Plan: 1. Hemorrhoids, unspecified hemorrhoid type Has OTC cream she will start to use Avoid straining Force fluids Monitor bleeding- Has resolved at this time and only occurred once yesterday AM Call if symptoms worsen or do not improve   2. Blood in stool     I discussed the assessment and treatment plan with the patient. The patient was provided an opportunity to ask questions and all were answered. The patient agreed with the plan and demonstrated an understanding of the instructions.   The patient was advised to call back or seek an in-person evaluation if the symptoms worsen or if the condition fails to improve as anticipated.  The above assessment and management plan was discussed with  the patient. The patient verbalized understanding of and has agreed to the management plan. Patient is aware to call the clinic if symptoms persist or worsen. Patient is aware when to return to the clinic for a follow-up visit. Patient educated on when it is appropriate to go to the emergency department.   Time call ended:  11:27 AM  I provided 11 minutes of non-face-to-face time during this encounter.    Evelina Dun, FNP

## 2019-01-24 ENCOUNTER — Other Ambulatory Visit: Payer: Self-pay | Admitting: *Deleted

## 2019-01-24 MED ORDER — ACCU-CHEK COMPACT PLUS VI STRP: ORAL_STRIP | 2 refills | Status: DC

## 2019-01-24 MED ORDER — ACCU-CHEK SOFTCLIX LANCETS MISC: 2 refills | Status: DC

## 2019-01-25 ENCOUNTER — Other Ambulatory Visit: Payer: Self-pay | Admitting: Family

## 2019-01-26 MED ORDER — ACCU-CHEK GUIDE W/DEVICE KIT: 1.0000 | PACK | Freq: Four times a day (QID) | 0 refills | Status: DC | PRN

## 2019-01-26 MED ORDER — ACCU-CHEK GUIDE VI STRP: ORAL_STRIP | 3 refills | Status: DC

## 2019-01-26 MED ORDER — ACCU-CHEK SOFTCLIX LANCETS MISC: 3 refills | Status: DC

## 2019-02-01 ENCOUNTER — Telehealth: Payer: Self-pay | Admitting: Family

## 2019-02-01 DIAGNOSIS — E119 Type 2 diabetes mellitus without complications: Secondary | ICD-10-CM

## 2019-02-01 DIAGNOSIS — M810 Age-related osteoporosis without current pathological fracture: Secondary | ICD-10-CM

## 2019-02-01 DIAGNOSIS — R5383 Other fatigue: Secondary | ICD-10-CM

## 2019-02-01 DIAGNOSIS — E1169 Type 2 diabetes mellitus with other specified complication: Secondary | ICD-10-CM

## 2019-02-01 DIAGNOSIS — E118 Type 2 diabetes mellitus with unspecified complications: Secondary | ICD-10-CM

## 2019-02-01 DIAGNOSIS — E1159 Type 2 diabetes mellitus with other circulatory complications: Secondary | ICD-10-CM

## 2019-02-01 DIAGNOSIS — E785 Hyperlipidemia, unspecified: Secondary | ICD-10-CM

## 2019-02-01 NOTE — Telephone Encounter (Signed)
lmtcb Evelina Dun, FNP is out of the office today.  Sent to review first thing in the morning

## 2019-02-02 NOTE — Telephone Encounter (Signed)
Labs ordered.

## 2019-02-02 NOTE — Addendum Note (Signed)
Addended by: Evelina Dun A on: 02/02/2019 08:21 AM   Modules accepted: Orders

## 2019-02-03 ENCOUNTER — Ambulatory Visit (INDEPENDENT_AMBULATORY_CARE_PROVIDER_SITE_OTHER): Payer: Medicare Other | Admitting: Family

## 2019-02-03 ENCOUNTER — Encounter: Payer: Self-pay | Admitting: Family

## 2019-02-03 DIAGNOSIS — E1159 Type 2 diabetes mellitus with other circulatory complications: Secondary | ICD-10-CM | POA: Diagnosis not present

## 2019-02-03 DIAGNOSIS — I5181 Takotsubo syndrome: Secondary | ICD-10-CM

## 2019-02-03 DIAGNOSIS — E1169 Type 2 diabetes mellitus with other specified complication: Secondary | ICD-10-CM | POA: Diagnosis not present

## 2019-02-03 DIAGNOSIS — E118 Type 2 diabetes mellitus with unspecified complications: Secondary | ICD-10-CM

## 2019-02-03 DIAGNOSIS — E785 Hyperlipidemia, unspecified: Secondary | ICD-10-CM

## 2019-02-03 DIAGNOSIS — E119 Type 2 diabetes mellitus without complications: Secondary | ICD-10-CM

## 2019-02-03 DIAGNOSIS — M1990 Unspecified osteoarthritis, unspecified site: Secondary | ICD-10-CM

## 2019-02-03 DIAGNOSIS — I10 Essential (primary) hypertension: Secondary | ICD-10-CM

## 2019-02-03 DIAGNOSIS — Z950 Presence of cardiac pacemaker: Secondary | ICD-10-CM

## 2019-02-03 NOTE — Progress Notes (Signed)
Virtual Visit via telephone Note Due to COVID-19 pandemic this visit was conducted virtually. This visit type was conducted due to national recommendations for restrictions regarding the COVID-19 Pandemic (e.g. social distancing, sheltering in place) in an effort to limit this patient's exposure and mitigate transmission in our community. All issues noted in this document were discussed and addressed.  A physical exam was not performed with this format.  I connected with Kathryn Henson on 02/03/19 at 11:00 AM by telephone and verified that I am speaking with the correct person using two identifiers. Kathryn Henson is currently located at home and daughter is currently with her during visit. The provider, Evelina Dun, FNP is located in their office at time of visit.  I discussed the limitations, risks, security and privacy concerns of performing an evaluation and management service by telephone and the availability of in person appointments. I also discussed with the patient that there may be a patient responsible charge related to this service. The patient expressed understanding and agreed to proceed.   History and Present Illness:  Pt calls the office today for chronic follow up. She is followed by her Cardiologists every 6 months for cardiomyopathy and has a pacemaker in place.  Diabetes She presents for her follow-up diabetic visit. She has type 2 diabetes mellitus. Her disease course has been stable. There are no hypoglycemic associated symptoms. Associated symptoms include foot paresthesias. Pertinent negatives for diabetes include no blurred vision. There are no hypoglycemic complications. Symptoms are stable. Diabetic complications include heart disease. Risk factors for coronary artery disease include dyslipidemia, diabetes mellitus, family history, hypertension, sedentary lifestyle and post-menopausal. She is following a generally healthy diet. She has had a previous visit with a  dietitian. Her overall blood glucose range is 130-140 mg/dl.  Hyperlipidemia This is a chronic problem. The current episode started more than 1 year ago. The problem is controlled. Recent lipid tests were reviewed and are normal. Exacerbating diseases include obesity. Pertinent negatives include no shortness of breath. Current antihyperlipidemic treatment includes statins. The current treatment provides moderate improvement of lipids. Risk factors for coronary artery disease include dyslipidemia, diabetes mellitus, hypertension and a sedentary lifestyle.  Hypertension This is a chronic problem. The current episode started more than 1 year ago. The problem has been resolved since onset. The problem is controlled. Associated symptoms include malaise/fatigue. Pertinent negatives include no blurred vision, peripheral edema or shortness of breath. Risk factors for coronary artery disease include dyslipidemia, diabetes mellitus, obesity and sedentary lifestyle. The current treatment provides moderate improvement. Hypertensive end-organ damage includes heart failure. There is no history of kidney disease.      Review of Systems  Constitutional: Positive for malaise/fatigue.  Eyes: Negative for blurred vision.  Respiratory: Negative for shortness of breath.      Observations/Objective: No SOB or distress noted   Assessment and Plan: 1. Hypertension associated with diabetes ([place])  2. Stress-induced cardiomyopathy  3. Diabetes mellitus without complication (HCC) - Microalbumin / creatinine urine ratio  4. Hyperlipidemia associated with type 2 diabetes mellitus ([place])  5. Type 2 diabetes mellitus with complication, without long-term current use of insulin ([place])  6. Arthritis  7. Pacemaker  Labs pending Healthy diet and exercise encouraged RTO in 6 months      I discussed the assessment and treatment plan with the patient. The patient was provided an opportunity to ask questions and all  were answered. The patient agreed with the plan and demonstrated an understanding of the instructions.  The patient was advised to call back or seek an in-person evaluation if the symptoms worsen or if the condition fails to improve as anticipated.  The above assessment and management plan was discussed with the patient. The patient verbalized understanding of and has agreed to the management plan. Patient is aware to call the clinic if symptoms persist or worsen. Patient is aware when to return to the clinic for a follow-up visit. Patient educated on when it is appropriate to go to the emergency department.   Time call ended:  11:16 AM  I provided 16 minutes of non-face-to-face time during this encounter.    Evelina Dun, FNP

## 2019-02-10 ENCOUNTER — Other Ambulatory Visit: Payer: Medicare Other

## 2019-02-10 ENCOUNTER — Other Ambulatory Visit: Payer: Self-pay

## 2019-02-10 DIAGNOSIS — E1159 Type 2 diabetes mellitus with other circulatory complications: Secondary | ICD-10-CM

## 2019-02-10 DIAGNOSIS — E1169 Type 2 diabetes mellitus with other specified complication: Secondary | ICD-10-CM

## 2019-02-10 DIAGNOSIS — E118 Type 2 diabetes mellitus with unspecified complications: Secondary | ICD-10-CM

## 2019-02-10 DIAGNOSIS — I152 Hypertension secondary to endocrine disorders: Secondary | ICD-10-CM

## 2019-02-10 DIAGNOSIS — M810 Age-related osteoporosis without current pathological fracture: Secondary | ICD-10-CM

## 2019-02-10 DIAGNOSIS — E119 Type 2 diabetes mellitus without complications: Secondary | ICD-10-CM

## 2019-02-10 DIAGNOSIS — R5383 Other fatigue: Secondary | ICD-10-CM

## 2019-02-10 LAB — BAYER DCA HB A1C WAIVED: HB A1C (BAYER DCA - WAIVED): 10.4 % — ABNORMAL HIGH (ref <7.0)

## 2019-02-11 LAB — LIPID PANEL
Chol/HDL Ratio: 2.9 ratio (ref 0.0–4.4)
Cholesterol, Total: 205 mg/dL — ABNORMAL HIGH (ref 100–199)
HDL: 71 mg/dL (ref >39)
LDL Chol Calc (NIH): 111 mg/dL — ABNORMAL HIGH (ref 0–99)
Triglycerides: 131 mg/dL (ref 0–149)
VLDL Cholesterol Cal: 23 mg/dL (ref 5–40)

## 2019-02-11 LAB — CBC WITH DIFFERENTIAL/PLATELET
Basophils Absolute: 0 10*3/uL (ref 0.0–0.2)
Basos: 0 %
EOS (ABSOLUTE): 0.1 10*3/uL (ref 0.0–0.4)
Eos: 2 %
Hematocrit: 36.8 % (ref 34.0–46.6)
Hemoglobin: 12.4 g/dL (ref 11.1–15.9)
Immature Grans (Abs): 0 10*3/uL (ref 0.0–0.1)
Immature Granulocytes: 0 %
Lymphocytes Absolute: 1.3 10*3/uL (ref 0.7–3.1)
Lymphs: 27 %
MCH: 31.5 pg (ref 26.6–33.0)
MCHC: 33.7 g/dL (ref 31.5–35.7)
MCV: 93 fL (ref 79–97)
Monocytes Absolute: 0.3 10*3/uL (ref 0.1–0.9)
Monocytes: 6 %
Neutrophils Absolute: 3.1 10*3/uL (ref 1.4–7.0)
Neutrophils: 65 %
Platelets: 174 10*3/uL (ref 150–450)
RBC: 3.94 x10E6/uL (ref 3.77–5.28)
RDW: 13.3 % (ref 11.7–15.4)
WBC: 4.8 10*3/uL (ref 3.4–10.8)

## 2019-02-11 LAB — CMP14+EGFR
ALT: 16 IU/L (ref 0–32)
AST: 16 IU/L (ref 0–40)
Albumin/Globulin Ratio: 1.5 (ref 1.2–2.2)
Albumin: 4 g/dL (ref 3.5–4.6)
Alkaline Phosphatase: 91 IU/L (ref 39–117)
BUN/Creatinine Ratio: 22 (ref 12–28)
BUN: 20 mg/dL (ref 10–36)
Bilirubin Total: 0.4 mg/dL (ref 0.0–1.2)
CO2: 24 mmol/L (ref 20–29)
Calcium: 9.1 mg/dL (ref 8.7–10.3)
Chloride: 102 mmol/L (ref 96–106)
Creatinine, Ser: 0.93 mg/dL (ref 0.57–1.00)
GFR calc Af Amer: 61 mL/min/{1.73_m2} (ref >59)
GFR calc non Af Amer: 53 mL/min/{1.73_m2} — ABNORMAL LOW (ref >59)
Globulin, Total: 2.6 g/dL (ref 1.5–4.5)
Glucose: 312 mg/dL — ABNORMAL HIGH (ref 65–99)
Potassium: 4.2 mmol/L (ref 3.5–5.2)
Sodium: 139 mmol/L (ref 134–144)
Total Protein: 6.6 g/dL (ref 6.0–8.5)

## 2019-02-11 LAB — MICROALBUMIN / CREATININE URINE RATIO
Creatinine, Urine: 113.3 mg/dL
Microalb/Creat Ratio: 10 mg/g creat (ref 0–29)
Microalbumin, Urine: 11.6 ug/mL

## 2019-02-11 LAB — VITAMIN D 25 HYDROXY (VIT D DEFICIENCY, FRACTURES): Vit D, 25-Hydroxy: 30.6 ng/mL (ref 30.0–100.0)

## 2019-02-11 LAB — TSH: TSH: 2.52 u[IU]/mL (ref 0.450–4.500)

## 2019-02-13 ENCOUNTER — Other Ambulatory Visit: Payer: Self-pay | Admitting: Family

## 2019-02-13 MED ORDER — TRESIBA FLEXTOUCH 100 UNIT/ML ~~LOC~~ SOPN: 5.0000 [IU] | PEN_INJECTOR | Freq: Every day | SUBCUTANEOUS | 1 refills | Status: DC

## 2019-04-13 ENCOUNTER — Other Ambulatory Visit: Payer: Self-pay

## 2019-04-13 ENCOUNTER — Other Ambulatory Visit: Payer: Medicare Other

## 2019-04-13 DIAGNOSIS — E119 Type 2 diabetes mellitus without complications: Secondary | ICD-10-CM

## 2019-04-13 LAB — BAYER DCA HB A1C WAIVED: HB A1C (BAYER DCA - WAIVED): 9.9 % — ABNORMAL HIGH (ref <7.0)

## 2019-04-18 ENCOUNTER — Other Ambulatory Visit: Payer: Self-pay | Admitting: Family

## 2019-04-18 MED ORDER — TRESIBA FLEXTOUCH 100 UNIT/ML ~~LOC~~ SOPN: 8.0000 [IU] | PEN_INJECTOR | Freq: Every day | SUBCUTANEOUS | 1 refills | Status: DC

## 2019-05-11 ENCOUNTER — Telehealth: Payer: Self-pay | Admitting: Family

## 2019-05-11 NOTE — Telephone Encounter (Signed)
Last two times she has been here her A1c have been elevated. When she checks BS at home it has been normal has some spikes through the day She was on Glipizide. Daughter states she thinks insulin is a big jump for a 85 year old. Is there something else they can ty first. Patient uses [facility] in [place]

## 2019-05-12 MED ORDER — SITAGLIPTIN PHOSPHATE 50 MG PO TABS: 50.0000 mg | ORAL_TABLET | Freq: Every day | ORAL | 1 refills | Status: DC

## 2019-05-12 NOTE — Telephone Encounter (Signed)
Continue Glimepiride 2 mg daily. Will add Januvia 50 mg. Low carb and recheck in 1 month.

## 2019-05-12 NOTE — Telephone Encounter (Signed)
Patients daughter notified and verbalized understanding 

## 2019-05-24 ENCOUNTER — Other Ambulatory Visit: Payer: Self-pay | Admitting: Family

## 2019-05-24 ENCOUNTER — Other Ambulatory Visit: Payer: Self-pay | Admitting: Family Medicine

## 2019-05-24 DIAGNOSIS — E785 Hyperlipidemia, unspecified: Secondary | ICD-10-CM

## 2019-05-24 DIAGNOSIS — E1169 Type 2 diabetes mellitus with other specified complication: Secondary | ICD-10-CM

## 2019-05-24 MED ORDER — ROSUVASTATIN CALCIUM 5 MG PO TABS: 5.0000 mg | ORAL_TABLET | Freq: Every day | ORAL | 1 refills | Status: DC

## 2019-05-24 MED ORDER — ROSUVASTATIN CALCIUM 5 MG PO TABS: 5.0000 mg | ORAL_TABLET | Freq: Every day | ORAL | 0 refills | Status: DC

## 2019-05-24 NOTE — Telephone Encounter (Signed)
RX sent

## 2019-05-24 NOTE — Telephone Encounter (Signed)
What is the name of the medication? crestor  Have you contacted your pharmacy to request a refill? yes  Which pharmacy would you like this sent to? [facility] [place]   Patient notified that their request is being sent to the clinical staff for review and that they should receive a call once it is complete. If they do not receive a call within 24 hours they can check with their pharmacy or our office.

## 2019-05-24 NOTE — Telephone Encounter (Signed)
Patient aware and verbalized understanding. °

## 2019-06-29 ENCOUNTER — Ambulatory Visit: Payer: Medicare PPO | Attending: Internal Medicine

## 2019-06-29 ENCOUNTER — Other Ambulatory Visit: Payer: Self-pay

## 2019-06-29 ENCOUNTER — Ambulatory Visit: Payer: Medicare PPO

## 2019-06-29 DIAGNOSIS — Z23 Encounter for immunization: Secondary | ICD-10-CM | POA: Insufficient documentation

## 2019-06-29 NOTE — Progress Notes (Signed)
   Covid-19 Vaccination Clinic  Name:  Kathryn Henson    MRN: [id number] DOB: 1923/03/16  06/29/2019  [name] was observed post Covid-19 immunization for 15 minutes without incidence. She was provided with Vaccine Information Sheet and instruction to access the V-Safe system.   [name] was instructed to call 911 with any severe reactions post vaccine: Marland Kitchen Difficulty breathing  . Swelling of your face and throat  . A fast heartbeat  . A bad rash all over your body  . Dizziness and weakness    Immunizations Administered    Name Date Dose VIS Date Route   Moderna COVID-19 Vaccine 06/29/2019 11:37 AM 0.5 mL 04/25/2019 Intramuscular   Manufacturer: Moderna   Lot: [id number]   [id number]

## 2019-07-01 ENCOUNTER — Ambulatory Visit: Payer: Medicare Other

## 2019-07-04 DIAGNOSIS — R55 Syncope and collapse: Secondary | ICD-10-CM | POA: Diagnosis not present

## 2019-07-04 DIAGNOSIS — I495 Sick sinus syndrome: Secondary | ICD-10-CM | POA: Diagnosis not present

## 2019-07-04 DIAGNOSIS — Z950 Presence of cardiac pacemaker: Secondary | ICD-10-CM | POA: Diagnosis not present

## 2019-07-04 DIAGNOSIS — Z45018 Encounter for adjustment and management of other part of cardiac pacemaker: Secondary | ICD-10-CM | POA: Diagnosis not present

## 2019-07-11 ENCOUNTER — Emergency Department (HOSPITAL_COMMUNITY): Payer: Medicare PPO

## 2019-07-11 ENCOUNTER — Other Ambulatory Visit: Payer: Self-pay

## 2019-07-11 ENCOUNTER — Emergency Department (HOSPITAL_COMMUNITY)
Admission: EM | Admit: 2019-07-11 | Discharge: 2019-07-11 | Disposition: A | Discharge status: Home / Self Care | Payer: Medicare PPO | Attending: Emergency Medicine | Admitting: Emergency Medicine

## 2019-07-11 ENCOUNTER — Encounter (HOSPITAL_COMMUNITY): Payer: Self-pay

## 2019-07-11 DIAGNOSIS — E119 Type 2 diabetes mellitus without complications: Secondary | ICD-10-CM | POA: Insufficient documentation

## 2019-07-11 DIAGNOSIS — Z950 Presence of cardiac pacemaker: Secondary | ICD-10-CM | POA: Diagnosis not present

## 2019-07-11 DIAGNOSIS — R531 Weakness: Secondary | ICD-10-CM | POA: Diagnosis not present

## 2019-07-11 DIAGNOSIS — R55 Syncope and collapse: Secondary | ICD-10-CM | POA: Diagnosis not present

## 2019-07-11 DIAGNOSIS — I10 Essential (primary) hypertension: Secondary | ICD-10-CM | POA: Diagnosis not present

## 2019-07-11 DIAGNOSIS — R5381 Other malaise: Secondary | ICD-10-CM | POA: Diagnosis not present

## 2019-07-11 DIAGNOSIS — Z87891 Personal history of nicotine dependence: Secondary | ICD-10-CM | POA: Diagnosis not present

## 2019-07-11 DIAGNOSIS — E1165 Type 2 diabetes mellitus with hyperglycemia: Secondary | ICD-10-CM | POA: Diagnosis not present

## 2019-07-11 DIAGNOSIS — R61 Generalized hyperhidrosis: Secondary | ICD-10-CM | POA: Diagnosis not present

## 2019-07-11 LAB — CBC WITH DIFFERENTIAL/PLATELET
Abs Immature Granulocytes: 0.03 10*3/uL (ref 0.00–0.07)
Basophils Absolute: 0 10*3/uL (ref 0.0–0.1)
Basophils Relative: 0 %
Eosinophils Absolute: 0 10*3/uL (ref 0.0–0.5)
Eosinophils Relative: 1 %
HCT: 37.1 % (ref 36.0–46.0)
Hemoglobin: 11.6 g/dL — ABNORMAL LOW (ref 12.0–15.0)
Immature Granulocytes: 1 %
Lymphocytes Relative: 11 %
Lymphs Abs: 0.7 10*3/uL (ref 0.7–4.0)
MCH: 31 pg (ref 26.0–34.0)
MCHC: 31.3 g/dL (ref 30.0–36.0)
MCV: 99.2 fL (ref 80.0–100.0)
Monocytes Absolute: 0.3 10*3/uL (ref 0.1–1.0)
Monocytes Relative: 5 %
Neutro Abs: 5.4 10*3/uL (ref 1.7–7.7)
Neutrophils Relative %: 82 %
Platelets: 155 10*3/uL (ref 150–400)
RBC: 3.74 MIL/uL — ABNORMAL LOW (ref 3.87–5.11)
RDW: 13.4 % (ref 11.5–15.5)
WBC: 6.5 10*3/uL (ref 4.0–10.5)
nRBC: 0 % (ref 0.0–0.2)

## 2019-07-11 LAB — URINALYSIS, ROUTINE W REFLEX MICROSCOPIC
Bacteria, UA: NONE SEEN
Bilirubin Urine: NEGATIVE
Glucose, UA: >=500 mg/dL — AB
Ketones, ur: NEGATIVE mg/dL
Leukocytes,Ua: NEGATIVE
Nitrite: NEGATIVE
Protein, ur: NEGATIVE mg/dL
Specific Gravity, Urine: 1.015 (ref 1.005–1.030)
pH: 5 (ref 5.0–8.0)

## 2019-07-11 LAB — COMPREHENSIVE METABOLIC PANEL
ALT: 13 U/L (ref 0–44)
AST: 16 U/L (ref 15–41)
Albumin: 3.4 g/dL — ABNORMAL LOW (ref 3.5–5.0)
Alkaline Phosphatase: 76 U/L (ref 38–126)
Anion gap: 10 (ref 5–15)
BUN: 18 mg/dL (ref 8–23)
CO2: 23 mmol/L (ref 22–32)
Calcium: 8.4 mg/dL — ABNORMAL LOW (ref 8.9–10.3)
Chloride: 105 mmol/L (ref 98–111)
Creatinine, Ser: 1.02 mg/dL — ABNORMAL HIGH (ref 0.44–1.00)
GFR calc Af Amer: 55 mL/min — ABNORMAL LOW (ref >60)
GFR calc non Af Amer: 47 mL/min — ABNORMAL LOW (ref >60)
Glucose, Bld: 268 mg/dL — ABNORMAL HIGH (ref 70–99)
Potassium: 4.3 mmol/L (ref 3.5–5.1)
Sodium: 138 mmol/L (ref 135–145)
Total Bilirubin: 0.5 mg/dL (ref 0.3–1.2)
Total Protein: 6.7 g/dL (ref 6.5–8.1)

## 2019-07-11 LAB — CBG MONITORING, ED: Glucose-Capillary: 272 mg/dL — ABNORMAL HIGH (ref 70–99)

## 2019-07-11 LAB — TROPONIN I (HIGH SENSITIVITY)
Troponin I (High Sensitivity): 17 ng/L (ref <18)
Troponin I (High Sensitivity): 16 ng/L (ref <18)

## 2019-07-11 MED ORDER — SODIUM CHLORIDE 0.9 % IV BOLUS
500.0000 mL | Freq: Once | INTRAVENOUS | Status: AC
  Administered 2019-07-11: 500 mL via INTRAVENOUS

## 2019-07-11 NOTE — Discharge Instructions (Signed)
You were seen in the emergency department today after almost passing out.  Your lab work and x-rays along with urine test look normal.  We interrogated your pacemaker and found no abnormal heart rhythms.  Please call your primary care doctor first thing tomorrow morning to schedule the next available appointment and return to the emergency department any new or suddenly worsening symptoms.

## 2019-07-11 NOTE — ED Triage Notes (Signed)
Arrived from home via EMS. Family reported patient weak and diaphoretic. Reported that pt ahd ate regular breakfast than had ate a lot of fruit. CBG by EMS 358 Received 236ml bolus through  Lt 20g placed by ems

## 2019-07-11 NOTE — ED Provider Notes (Signed)
Emergency Department Provider Note   I have reviewed the triage vital signs and the nursing notes.   HISTORY  Chief Complaint Weakness (diaphortic per ems report)   HPI Kathryn Henson is a 86 y.o. female with PMH of DM, HTN, HLD, and [employer] pacemaker followed by [facility] presents to the emergency department with decreased alertness at home.  The patient lives alone but her daughter lives next door and checks on her multiple times per day.  The patient states that she is feeling fine today and thinks she may have become weak and lightheaded.  She does not recall exactly what happened but states the next thing she knows that EMS is in her house.  She believes that her daughter called 911 but cannot recall the circumstances surrounding this.  In speaking with the daughter by phone she tells me that she made breakfast for her mom this morning.  She has been laying around in bed for the past couple of days due to bad weather and did not want much to eat this morning.  She states that otherwise she seemed okay.  She would to check on her later in the afternoon and found her sitting on the couch, diaphoretic, and minimally responsive.  The daughter tells me that the patient has had episodes like this in the past and that she does have a pacemaker.  She called 911 but states that the patient had return to more of her normal self by the time they arrived.  She denies any vomiting, recent falls, fevers, changes to medicines.   Patient tells me that now she is feeling very well and has no complaints.   Past Medical History:  Diagnosis Date  . Breast cancer ([place])   . Cancer ([place])    left breast  . Diabetes mellitus without complication ([place])   . Glaucoma   . Hyperlipidemia   . Hypertension   . Osteoporosis   . Pacemaker   . Personal history of radiation therapy 04/2001    Patient Active Problem List   Diagnosis Date Noted  . Arthritis 08/11/2013  . Swelling of ankle 08/11/2013   . Seborrheic keratoses, inflamed 05/29/2013  . Cellulitis 05/29/2013  . Allergic rhinitis 05/13/2013  . Fatigue 05/13/2013  . Hyperlipidemia associated with type 2 diabetes mellitus ([place]) 05/13/2013  . Type 2 diabetes mellitus with complication, without Annasophia Crocker-term current use of insulin ([place]) 05/13/2013  . Cancer ([place])   . Osteoporosis   . Diabetes mellitus without complication ([place])   . Glaucoma   . Hypertension associated with diabetes ([place])   . Pacemaker   . Syncope and collapse 09/10/2012  . Syncope 09/10/2012  . Stress-induced cardiomyopathy 11/17/2011  . Takotsubo syndrome 11/17/2011    Past Surgical History:  Procedure Laterality Date  . BREAST BIOPSY Left 03/11/2001  . BREAST LUMPECTOMY Left 03/25/2001  . Lumpectomy left breast    . PACEMAKER INSERTION  04/08/2007   serial [id number], model 5826    Allergies Atorvastatin, Fluzone  [flu virus vaccine], Metformin, and Sulfa antibiotics  Family History  Problem Relation Age of Onset  . Heart disease Mother   . Diabetes Mother   . Stroke Father   . Breast cancer Sister   . Breast cancer Daughter 98  . Sarcoidosis Son   . Breast cancer Sister   . Breast cancer Other     Social History Social History   Tobacco Use  . Smoking status: Former Smoker    Types: Cigarettes  Quit date: 05/25/1968    Years since quitting: 51.1  . Smokeless tobacco: Never Used  Substance Use Topics  . Alcohol use: No  . Drug use: No    Review of Systems  Constitutional: No fever/chills. Positive lightheaded episode.  Eyes: No visual changes. ENT: No sore throat. Cardiovascular: Denies chest pain. Respiratory: Denies shortness of breath. Gastrointestinal: No abdominal pain.  No nausea, no vomiting.  No diarrhea.  No constipation. Genitourinary: Negative for dysuria. Musculoskeletal: Negative for back pain. Skin: Negative for rash. Neurological: Negative for headaches, focal weakness or numbness.  10-point ROS otherwise  negative.  ____________________________________________   PHYSICAL EXAM:  VITAL SIGNS: ED Triage Vitals  Enc Vitals Group     BP 07/11/19 1459 136/74     Pulse Rate 07/11/19 1459 70     Resp 07/11/19 1459 (!) 21     Temp 07/11/19 1459 98.6 F (37 C)     Temp Source 07/11/19 1459 Oral     SpO2 07/11/19 1459 100 %     Weight 07/11/19 1505 137 lb (62.1 kg)     Height 07/11/19 1505 5\' 1"  (1.549 m)   Constitutional: Alert. Well appearing and in no acute distress. Eyes: Conjunctivae are normal.  Head: Atraumatic. Nose: No congestion/rhinnorhea. Mouth/Throat: Mucous membranes are moist.   Neck: No stridor.  Cardiovascular: Normal rate, regular rhythm. Good peripheral circulation. Grossly normal heart sounds.   Respiratory: Normal respiratory effort.  No retractions. Lungs CTAB. Gastrointestinal: Soft and nontender. No distention.  Musculoskeletal: No lower extremity tenderness nor edema. No gross deformities of extremities. Neurologic:  Normal speech and language. No gross focal neurologic deficits are appreciated.  Skin:  Skin is warm, dry and intact. No rash noted.   ____________________________________________   LABS (all labs ordered are listed, but only abnormal results are displayed)  Labs Reviewed  COMPREHENSIVE METABOLIC PANEL - Abnormal; Notable for the following components:      Result Value   Glucose, Bld 268 (*)    Creatinine, Ser 1.02 (*)    Calcium 8.4 (*)    Albumin 3.4 (*)    GFR calc non Af Amer 47 (*)    GFR calc Af Amer 55 (*)    All other components within normal limits  CBC WITH DIFFERENTIAL/PLATELET - Abnormal; Notable for the following components:   RBC 3.74 (*)    Hemoglobin 11.6 (*)    All other components within normal limits  URINALYSIS, ROUTINE W REFLEX MICROSCOPIC - Abnormal; Notable for the following components:   Glucose, UA >=500 (*)    Hgb urine dipstick SMALL (*)    All other components within normal limits  CBG MONITORING, ED -  Abnormal; Notable for the following components:   Glucose-Capillary 272 (*)    All other components within normal limits  TROPONIN I (HIGH SENSITIVITY)  TROPONIN I (HIGH SENSITIVITY)   ____________________________________________  EKG   EKG Interpretation  Date/Time:  Tuesday July 11 2019 14:58:08 EST Ventricular Rate:  66 PR Interval:    QRS Duration: 96 QT Interval:  426 QTC Calculation: 447 R Axis:   41 Text Interpretation: Sinus rhythm No STEMI Confirmed by Nanda Quinton [phone]) on 07/11/2019 3:28:12 PM       ____________________________________________  RADIOLOGY  DG Chest Portable 1 View  Result Date: 07/11/2019 CLINICAL DATA:  Weakness EXAM: PORTABLE CHEST 1 VIEW COMPARISON:  None. FINDINGS: Cardiac shadow is at the upper limits of normal in size. Pacing device is noted. Aortic calcifications are seen. The lungs are  well aerated bilaterally without focal infiltrate or sizable effusion. No bony abnormality is noted. IMPRESSION: No active disease. Electronically Signed   By: Inez Catalina M.D.   On: 07/11/2019 16:00    ____________________________________________   PROCEDURES  Procedure(s) performed:   Procedures  None  ____________________________________________   INITIAL IMPRESSION / ASSESSMENT AND PLAN / ED COURSE  Pertinent labs & imaging results that were available during my care of the patient were reviewed by me and considered in my medical decision making (see chart for details).   Patient arrives to the emergency department for evaluation of episode of being found minimally responsive and diaphoretic.  She does have a English as a second language teacher which we will interrogate.  Vital signs here are normal.  Patient with no focal neurologic deficits.  Plan for screening blood work along with chest x-ray and reassess.  07:02 PM  Labs including serial troponins are within normal limits. Patient with no additional events on telemetry here or altered mental status.  [employer] representative came in to interrogate the pacemaker with no ventricular events recorded today. Battery life is good. No clear explanation for the patient's symptoms. In discussion with the daughter previously she has had similar events in the past. Will discuss with her regarding ultimate disposition.  Labs and imaging reviewed. Spoke with daughter who describes similar events in the past. She will f/u with the patient's PCP. Discussed watching closely at home and ED return precautions.  ____________________________________________  FINAL CLINICAL IMPRESSION(S) / ED DIAGNOSES  Final diagnoses:  Near syncope     MEDICATIONS GIVEN DURING THIS VISIT:  Medications  sodium chloride 0.9 % bolus 500 mL (0 mLs Intravenous Stopped 07/11/19 2056)    Note:  This document was prepared using Dragon voice recognition software and may include unintentional dictation errors.  Nanda Quinton, MD, [facility]    Rheanne Cortopassi, [facility], MD 07/12/19 2051

## 2019-07-24 ENCOUNTER — Telehealth (INDEPENDENT_AMBULATORY_CARE_PROVIDER_SITE_OTHER): Payer: Medicare PPO | Admitting: Physician Assistant

## 2019-07-24 ENCOUNTER — Encounter: Payer: Self-pay | Admitting: Physician Assistant

## 2019-07-24 DIAGNOSIS — E785 Hyperlipidemia, unspecified: Secondary | ICD-10-CM | POA: Diagnosis not present

## 2019-07-24 DIAGNOSIS — E1169 Type 2 diabetes mellitus with other specified complication: Secondary | ICD-10-CM

## 2019-07-24 DIAGNOSIS — N3000 Acute cystitis without hematuria: Secondary | ICD-10-CM

## 2019-07-24 MED ORDER — CEPHALEXIN 250 MG PO CAPS: 250.0000 mg | ORAL_CAPSULE | Freq: Two times a day (BID) | ORAL | 0 refills | Status: DC

## 2019-07-24 MED ORDER — ROSUVASTATIN CALCIUM 5 MG PO TABS: 5.0000 mg | ORAL_TABLET | Freq: Every morning | ORAL | 5 refills | Status: DC

## 2019-07-24 NOTE — Progress Notes (Signed)
Telephone visit  Subjective: CC: UTI symptoms PCP: Sharion Balloon, FNP [id number] Kathryn Henson is a 86 y.o. female calls for telephone consult today. Patient provides verbal consent for consult held via phone.  Patient is identified with 2 separate identifiers.  At this time the entire area is on COVID-19 social distancing and stay home orders are in place.  Patient is of higher risk and therefore we are performing this by a virtual method.  Location of patient: Home Location of provider: [facility] Others present for call: Daughter  The family member called was the patient's daughter.  And they had her on speaker phone.  We originally sent the text at 1250 for the video visit but they were not sure what to do with this.  Plus she said the message said that her appointment was at 5:00 so she thought it meant but it would be done then.  We went ahead and progressed with a telephone visit starting at 1:48 PM.  Patient did go to the emergency room last week for some weakness and ended up having a catheter to collect urine.  Over the weekend she started having pain with urination, and urge to go with low production, burning, no fever.  Ms. Vegh does get urinary tract infections but not very often.    ROS: Per HPI  Allergies  Allergen Reactions  . Atorvastatin     Other reaction(s): Myalgias (intolerance)  . Fluzone  [Flu Virus Vaccine] Swelling  . Metformin Nausea Only  . Sulfa Antibiotics Other (See Comments)    unknown   Past Medical History:  Diagnosis Date  . Breast cancer ([place])   . Cancer ([place])    left breast  . Diabetes mellitus without complication ([place])   . Glaucoma   . Hyperlipidemia   . Hypertension   . Osteoporosis   . Pacemaker   . Personal history of radiation therapy 04/2001    Current Outpatient Medications:  .  aspirin 81 MG tablet, Take 81 mg by mouth every morning. , Disp: , Rfl:  .  cephALEXin (KEFLEX) 250 MG capsule, Take 1 capsule (250 mg total)  by mouth 2 (two) times daily., Disp: 14 capsule, Rfl: 0 .  Cholecalciferol (VITAMIN D-1000 MAX ST) 1000 UNITS tablet, Take 1,000 Units by mouth every morning. , Disp: , Rfl:  .  Coenzyme Q10 (COQ-10) 200 MG CAPS, 200 mg. Take 200 mg by mouth daily., Disp: , Rfl:  .  glimepiride (AMARYL) 2 MG tablet, TAKE 1 TABLET BY MOUTH TWICE DAILY WITH A MEAL (Patient taking differently: Take 2 mg by mouth 2 (two) times daily with a meal. ), Disp: 180 tablet, Rfl: 1 .  lisinopril (ZESTRIL) 10 MG tablet, Take 10 mg by mouth every morning., Disp: , Rfl:  .  metoprolol tartrate (LOPRESSOR) 25 MG tablet, Take 1 tablet (25 mg total) by mouth 2 (two) times daily. (Patient taking differently: Take 25 mg by mouth every morning. ), Disp: 180 tablet, Rfl: 3 .  nitroGLYCERIN (NITROSTAT) 0.4 MG SL tablet, Place 0.4 mg under the tongue every 5 (five) minutes as needed for chest pain., Disp: , Rfl:  .  rosuvastatin (CRESTOR) 5 MG tablet, Take 1 tablet (5 mg total) by mouth every morning., Disp: 30 tablet, Rfl: 5  Assessment/ Plan: 86 y.o. female     1. Acute cystitis without hematuria  - cephALEXin (KEFLEX) 250 MG capsule; Take 1 capsule (250 mg total) by mouth 2 (two) times daily.  Dispense:  14 capsule; Refill: 0    No follow-ups on file.  Continue all other maintenance medications as listed above.  I discussed the assessment and treatment plan with the patient. The patient was provided an opportunity to ask questions and all were answered. The patient agreed with the plan and demonstrated an understanding of the instructions.   The patient was advised to call back or seek an in-person evaluation if the symptoms worsen or if the condition fails to improve as anticipated.   The above assessment and management plan was discussed with the patient. The patient verbalized understanding of and has agreed to the management plan. Patient is aware to call the clinic if symptoms persist or worsen. Patient is aware when to  return to the clinic for a follow-up visit. Patient educated on when it is appropriate to go to the emergency department.    Start time: 1:48 PM End time: 1:58 PM  Meds ordered this encounter  Medications  . rosuvastatin (CRESTOR) 5 MG tablet    Sig: Take 1 tablet (5 mg total) by mouth every morning.    Dispense:  30 tablet    Refill:  5    Order Specific Question:   Supervising Provider    Answer:   Janora Norlander [id number]  . cephALEXin (KEFLEX) 250 MG capsule    Sig: Take 1 capsule (250 mg total) by mouth 2 (two) times daily.    Dispense:  14 capsule    Refill:  0    Order Specific Question:   Supervising Provider    Answer:   Janora Norlander [id number]    Particia Nearing PA-C [place] [phone]

## 2019-07-31 ENCOUNTER — Ambulatory Visit: Payer: Medicare PPO | Attending: Internal Medicine

## 2019-07-31 DIAGNOSIS — Z23 Encounter for immunization: Secondary | ICD-10-CM | POA: Insufficient documentation

## 2019-07-31 NOTE — Progress Notes (Signed)
   Covid-19 Vaccination Clinic  Name:  Kathryn Henson    MRN: [id number] DOB: 1922/08/30  07/31/2019  [name] was observed post Covid-19 immunization for 15 minutes without incident. She was provided with Vaccine Information Sheet and instruction to access the V-Safe system.   [name] was instructed to call 911 with any severe reactions post vaccine: Marland Kitchen Difficulty breathing  . Swelling of face and throat  . A fast heartbeat  . A bad rash all over body  . Dizziness and weakness   Immunizations Administered    Name Date Dose VIS Date Route   Moderna COVID-19 Vaccine 07/31/2019 10:40 AM 0.5 mL 04/25/2019 Intramuscular   Manufacturer: Moderna   Lot: [id number]   [id number]

## 2019-08-01 ENCOUNTER — Telehealth: Payer: Self-pay | Admitting: Family

## 2019-08-01 NOTE — Telephone Encounter (Signed)
ADVISED THIS IS A SIDE EFFECT. CALL BACK FOR ANY CHANGES TO INJECTION SITE.

## 2019-08-01 NOTE — Telephone Encounter (Signed)
Arm is really warm to touch after second COVID vaccine. Only symptom. Advise she could take tylenol if pain and cool compresses. Patient daughter in agreeable anything else to add.

## 2019-08-12 ENCOUNTER — Other Ambulatory Visit: Payer: Self-pay | Admitting: Family Medicine

## 2019-08-12 DIAGNOSIS — E118 Type 2 diabetes mellitus with unspecified complications: Secondary | ICD-10-CM

## 2019-10-20 DIAGNOSIS — I495 Sick sinus syndrome: Secondary | ICD-10-CM | POA: Diagnosis not present

## 2019-11-15 ENCOUNTER — Other Ambulatory Visit: Payer: Self-pay | Admitting: Family

## 2019-11-15 DIAGNOSIS — E118 Type 2 diabetes mellitus with unspecified complications: Secondary | ICD-10-CM

## 2019-11-16 NOTE — Telephone Encounter (Signed)
Needs to be seen.  Last seen 01/2019

## 2019-11-20 ENCOUNTER — Other Ambulatory Visit: Payer: Self-pay | Admitting: Family

## 2019-11-20 DIAGNOSIS — E118 Type 2 diabetes mellitus with unspecified complications: Secondary | ICD-10-CM

## 2019-11-21 ENCOUNTER — Other Ambulatory Visit: Payer: Self-pay | Admitting: Family

## 2019-11-21 NOTE — Telephone Encounter (Signed)
  Prescription Request  11/21/2019  What is the name of the medication or equipment? Lisinopril 10 mg Patient has appt 7-6 with Christy and needs enough Lisinopril called in to get her to her appt. She is out  Have you contacted your pharmacy to request a refill? (if applicable) YES  Which pharmacy would you like this sent to? [facility]   Patient notified that their request is being sent to the clinical staff for review and that they should receive a response within 2 business days.

## 2019-11-22 MED ORDER — LISINOPRIL 10 MG PO TABS: 10.0000 mg | ORAL_TABLET | Freq: Every morning | ORAL | 0 refills | Status: DC

## 2019-11-22 NOTE — Telephone Encounter (Signed)
Left message advising rx that was requested was sent in and to call back with any further questions or concerns.

## 2019-11-28 ENCOUNTER — Ambulatory Visit: Payer: Medicare PPO | Admitting: Family

## 2019-11-28 ENCOUNTER — Encounter: Payer: Self-pay | Admitting: Family

## 2019-11-28 ENCOUNTER — Other Ambulatory Visit: Payer: Self-pay

## 2019-11-28 VITALS — BP 111/69 | HR 83 | Temp 97.6°F | Ht 61.0 in | Wt 137.8 lb

## 2019-11-28 DIAGNOSIS — N898 Other specified noninflammatory disorders of vagina: Secondary | ICD-10-CM

## 2019-11-28 DIAGNOSIS — E118 Type 2 diabetes mellitus with unspecified complications: Secondary | ICD-10-CM | POA: Diagnosis not present

## 2019-11-28 DIAGNOSIS — E785 Hyperlipidemia, unspecified: Secondary | ICD-10-CM

## 2019-11-28 DIAGNOSIS — E1159 Type 2 diabetes mellitus with other circulatory complications: Secondary | ICD-10-CM | POA: Diagnosis not present

## 2019-11-28 DIAGNOSIS — I10 Essential (primary) hypertension: Secondary | ICD-10-CM

## 2019-11-28 DIAGNOSIS — I152 Hypertension secondary to endocrine disorders: Secondary | ICD-10-CM

## 2019-11-28 DIAGNOSIS — M1990 Unspecified osteoarthritis, unspecified site: Secondary | ICD-10-CM

## 2019-11-28 DIAGNOSIS — E1169 Type 2 diabetes mellitus with other specified complication: Secondary | ICD-10-CM | POA: Diagnosis not present

## 2019-11-28 DIAGNOSIS — E119 Type 2 diabetes mellitus without complications: Secondary | ICD-10-CM

## 2019-11-28 LAB — WET PREP FOR TRICH, YEAST, CLUE
Clue Cell Exam: NEGATIVE
Trichomonas Exam: NEGATIVE
Yeast Exam: POSITIVE — AB

## 2019-11-28 LAB — BAYER DCA HB A1C WAIVED: HB A1C (BAYER DCA - WAIVED): 11.7 % — ABNORMAL HIGH (ref <7.0)

## 2019-11-28 MED ORDER — LISINOPRIL 10 MG PO TABS: 10.0000 mg | ORAL_TABLET | Freq: Every morning | ORAL | 3 refills | Status: DC

## 2019-11-28 MED ORDER — GLIMEPIRIDE 2 MG PO TABS: ORAL_TABLET | ORAL | 3 refills | Status: DC

## 2019-11-28 MED ORDER — ROSUVASTATIN CALCIUM 5 MG PO TABS: 5.0000 mg | ORAL_TABLET | Freq: Every morning | ORAL | 3 refills | Status: DC

## 2019-11-28 MED ORDER — FLUCONAZOLE 150 MG PO TABS: 150.0000 mg | ORAL_TABLET | ORAL | 0 refills | Status: DC | PRN

## 2019-11-28 MED ORDER — METOPROLOL TARTRATE 25 MG PO TABS: 25.0000 mg | ORAL_TABLET | Freq: Two times a day (BID) | ORAL | 3 refills | Status: DC

## 2019-11-28 NOTE — Progress Notes (Signed)
Subjective:    Patient ID: Kathryn Henson, female    DOB: 1923/03/01, 86 y.o.   MRN: [id number]  Chief Complaint  Patient presents with  . Medical Management of Chronic Issues   Pt calls the office today for chronic follow up. She is followed by her Cardiologists every 6 months for cardiomyopathy and has a pacemaker in place.  Diabetes She presents for her follow-up diabetic visit. She has type 2 diabetes mellitus. Her disease course has been stable. Pertinent negatives for diabetes include no blurred vision and no foot paresthesias. Symptoms are stable. Diabetic complications include heart disease. Pertinent negatives for diabetic complications include no CVA, nephropathy or peripheral neuropathy. Risk factors for coronary artery disease include hypertension, sedentary lifestyle, post-menopausal, dyslipidemia and diabetes mellitus. She is following a generally healthy diet. Her overall blood glucose range is 140-180 mg/dl. An ACE inhibitor/angiotensin II receptor blocker is being taken. Eye exam is current.  Hypertension This is a chronic problem. The current episode started more than 1 year ago. The problem has been resolved since onset. Associated symptoms include malaise/fatigue. Pertinent negatives include no blurred vision, peripheral edema or shortness of breath. Risk factors for coronary artery disease include sedentary lifestyle, dyslipidemia and diabetes mellitus. Hypertensive end-organ damage includes CAD/MI. There is no history of CVA.  Hyperlipidemia This is a chronic problem. The current episode started more than 1 year ago. The problem is controlled. Recent lipid tests were reviewed and are normal. Pertinent negatives include no shortness of breath. Current antihyperlipidemic treatment includes diet change and statins. The current treatment provides moderate improvement of lipids. Risk factors for coronary artery disease include dyslipidemia, diabetes mellitus, hypertension and a  sedentary lifestyle.  Arthritis Presents for follow-up visit. She complains of pain and stiffness. The symptoms have been stable. Affected locations include the left MCP and right MCP. Her pain is at a severity of 1/10.  Vaginal Itching The patient's primary symptoms include genital itching. The patient's pertinent negatives include no vaginal bleeding or vaginal discharge.      Review of Systems  Constitutional: Positive for malaise/fatigue.  Eyes: Negative for blurred vision.  Respiratory: Negative for shortness of breath.   Genitourinary: Negative for vaginal discharge.  Musculoskeletal: Positive for arthritis and stiffness.  All other systems reviewed and are negative.      Objective:   Physical Exam Vitals reviewed.  Constitutional:      General: She is not in acute distress.    Appearance: She is well-developed.  HENT:     Head: Normocephalic and atraumatic.     Right Ear: Tympanic membrane normal.     Left Ear: Tympanic membrane normal.  Eyes:     Pupils: Pupils are equal, round, and reactive to light.  Neck:     Thyroid: No thyromegaly.  Cardiovascular:     Rate and Rhythm: Normal rate and regular rhythm.     Heart sounds: Normal heart sounds. No murmur heard.   Pulmonary:     Effort: Pulmonary effort is normal. No respiratory distress.     Breath sounds: Normal breath sounds. No wheezing.  Abdominal:     General: Bowel sounds are normal. There is no distension.     Palpations: Abdomen is soft.     Tenderness: There is no abdominal tenderness.  Musculoskeletal:        General: No tenderness. Normal range of motion.     Cervical back: Normal range of motion and neck supple.  Skin:    General: Skin is  warm and dry.  Neurological:     Mental Status: She is alert and oriented to person, place, and time.     Cranial Nerves: No cranial nerve deficit.     Deep Tendon Reflexes: Reflexes are normal and symmetric.  Psychiatric:        Behavior: Behavior normal.         Thought Content: Thought content normal.        Judgment: Judgment normal.       BP 111/69   Pulse 83   Temp 97.6 F (36.4 C) (Temporal)   Ht _0  (1.549 m)   Wt 137 lb 12.8 oz (62.5 kg)   BMI 26.04 kg/m      Assessment & Plan:  Kathryn Henson comes in today with chief complaint of Medical Management of Chronic Issues   Diagnosis and orders addressed:  1. Hyperlipidemia associated with type 2 diabetes mellitus ([facility]CC) - rosuvastatin (CRESTOR) 5 MG tablet; Take 1 tablet (5 mg total) by mouth every morning.  Dispense: 90 tablet; Refill: 3 - CMP14+EGFR - CBC with Differential/Platelet - Lipid panel  2. Hypertension associated with diabetes ([place]) - metoprolol tartrate (LOPRESSOR) 25 MG tablet; Take 1 tablet (25 mg total) by mouth 2 (two) times daily.  Dispense: 180 tablet; Refill: 3 - CMP14+EGFR - CBC with Differential/Platelet  3. Type 2 diabetes mellitus with complication, without long-term current use of insulin (HCC) - glimepiride (AMARYL) 2 MG tablet; TAKE 1 TABLET BY MOUTH TWICE DAILY WITH A MEAL.  Needs to be seen for further refills.  Dispense: 180 tablet; Refill: 3 - CMP14+EGFR - CBC with Differential/Platelet - Bayer DCA Hb A1c Waived  4. Diabetes mellitus without complication ([place]) - LZJ67+HALP - CBC with Differential/Platelet  5. Arthritis - CMP14+EGFR - CBC with Differential/Platelet  6. Vagina itching - CMP14+EGFR - CBC with Differential/Platelet - WET PREP FOR TRICH, YEAST, CLUE - fluconazole (DIFLUCAN) 150 MG tablet; Take 1 tablet (150 mg total) by mouth every three (3) days as needed.  Dispense: 3 tablet; Refill: 0   Labs pending Health Maintenance reviewed Diet and exercise encouraged  Follow up plan: 6 months    Evelina Dun, FNP

## 2019-11-28 NOTE — Patient Instructions (Signed)
Vaginal Yeast Infection, Adult  Vaginal yeast infection is a condition that causes vaginal discharge as well as soreness, swelling, and redness (inflammation) of the vagina. This is a common condition. Some women get this infection frequently. What are the causes? This condition is caused by a change in the normal balance of the yeast (candida) and bacteria that live in the vagina. This change causes an overgrowth of yeast, which causes the inflammation. What increases the risk? The condition is more likely to develop in women who:  Take antibiotic medicines.  Have diabetes.  Take birth control pills.  Are pregnant.  Douche often.  Have a weak body defense system (immune system).  Have been taking steroid medicines for a long time.  Frequently wear tight clothing. What are the signs or symptoms? Symptoms of this condition include:  White, thick, creamy vaginal discharge.  Swelling, itching, redness, and irritation of the vagina. The lips of the vagina (vulva) may be affected as well.  Pain or a burning feeling while urinating.  Pain during sex. How is this diagnosed? This condition is diagnosed based on:  Your medical history.  A physical exam.  A pelvic exam. Your health care provider will examine a sample of your vaginal discharge under a microscope. Your health care provider may send this sample for testing to confirm the diagnosis. How is this treated? This condition is treated with medicine. Medicines may be over-the-counter or prescription. You may be told to use one or more of the following:  Medicine that is taken by mouth (orally).  Medicine that is applied as a cream (topically).  Medicine that is inserted directly into the vagina (suppository). Follow these instructions at home:  Lifestyle  Do not have sex until your health care provider approves. Tell your sex partner that you have a yeast infection. That person should go to his or her health care  provider and ask if they should also be treated.  Do not wear tight clothes, such as pantyhose or tight pants.  Wear breathable cotton underwear. General instructions  Take or apply over-the-counter and prescription medicines only as told by your health care provider.  Eat more yogurt. This may help to keep your yeast infection from returning.  Do not use tampons until your health care provider approves.  Try taking a sitz bath to help with discomfort. This is a warm water bath that is taken while you are sitting down. The water should only come up to your hips and should cover your buttocks. Do this 3-4 times per day or as told by your health care provider.  Do not douche.  If you have diabetes, keep your blood sugar levels under control.  Keep all follow-up visits as told by your health care provider. This is important. Contact a health care provider if:  You have a fever.  Your symptoms go away and then return.  Your symptoms do not get better with treatment.  Your symptoms get worse.  You have new symptoms.  You develop blisters in or around your vagina.  You have blood coming from your vagina and it is not your menstrual period.  You develop pain in your abdomen. Summary  Vaginal yeast infection is a condition that causes discharge as well as soreness, swelling, and redness (inflammation) of the vagina.  This condition is treated with medicine. Medicines may be over-the-counter or prescription.  Take or apply over-the-counter and prescription medicines only as told by your health care provider.  Do not douche.   Do not have sex or use tampons until your health care provider approves.  Contact a health care provider if your symptoms do not get better with treatment or your symptoms go away and then return. This information is not intended to replace advice given to you by your health care provider. Make sure you discuss any questions you have with your health care  provider. Document Revised: 12/09/2018 Document Reviewed: 09/27/2017 [employer] Patient Education  2020 [employer]  

## 2019-11-29 DIAGNOSIS — E119 Type 2 diabetes mellitus without complications: Secondary | ICD-10-CM | POA: Diagnosis not present

## 2019-11-29 LAB — CMP14+EGFR
ALT: 17 IU/L (ref 0–32)
AST: 11 IU/L (ref 0–40)
Albumin/Globulin Ratio: 1.2 (ref 1.2–2.2)
Albumin: 3.7 g/dL (ref 3.5–4.6)
Alkaline Phosphatase: 102 IU/L (ref 48–121)
BUN/Creatinine Ratio: 23 (ref 12–28)
BUN: 21 mg/dL (ref 10–36)
Bilirubin Total: 0.3 mg/dL (ref 0.0–1.2)
CO2: 23 mmol/L (ref 20–29)
Calcium: 9.1 mg/dL (ref 8.7–10.3)
Chloride: 103 mmol/L (ref 96–106)
Creatinine, Ser: 0.92 mg/dL (ref 0.57–1.00)
GFR calc Af Amer: 61 mL/min/{1.73_m2} (ref >59)
GFR calc non Af Amer: 53 mL/min/{1.73_m2} — ABNORMAL LOW (ref >59)
Globulin, Total: 3.1 g/dL (ref 1.5–4.5)
Glucose: 536 mg/dL (ref 65–99)
Potassium: 4.7 mmol/L (ref 3.5–5.2)
Sodium: 138 mmol/L (ref 134–144)
Total Protein: 6.8 g/dL (ref 6.0–8.5)

## 2019-11-29 LAB — LIPID PANEL
Chol/HDL Ratio: 3.2 ratio (ref 0.0–4.4)
Cholesterol, Total: 207 mg/dL — ABNORMAL HIGH (ref 100–199)
HDL: 64 mg/dL (ref >39)
LDL Chol Calc (NIH): 112 mg/dL — ABNORMAL HIGH (ref 0–99)
Triglycerides: 179 mg/dL — ABNORMAL HIGH (ref 0–149)
VLDL Cholesterol Cal: 31 mg/dL (ref 5–40)

## 2019-11-29 LAB — CBC WITH DIFFERENTIAL/PLATELET
Basophils Absolute: 0 10*3/uL (ref 0.0–0.2)
Basos: 0 %
EOS (ABSOLUTE): 0.1 10*3/uL (ref 0.0–0.4)
Eos: 2 %
Hematocrit: 38 % (ref 34.0–46.6)
Hemoglobin: 12.1 g/dL (ref 11.1–15.9)
Immature Grans (Abs): 0 10*3/uL (ref 0.0–0.1)
Immature Granulocytes: 0 %
Lymphocytes Absolute: 1.4 10*3/uL (ref 0.7–3.1)
Lymphs: 30 %
MCH: 30.6 pg (ref 26.6–33.0)
MCHC: 31.8 g/dL (ref 31.5–35.7)
MCV: 96 fL (ref 79–97)
Monocytes Absolute: 0.4 10*3/uL (ref 0.1–0.9)
Monocytes: 8 %
Neutrophils Absolute: 2.7 10*3/uL (ref 1.4–7.0)
Neutrophils: 60 %
Platelets: 166 10*3/uL (ref 150–450)
RBC: 3.96 x10E6/uL (ref 3.77–5.28)
RDW: 13.6 % (ref 11.7–15.4)
WBC: 4.5 10*3/uL (ref 3.4–10.8)

## 2019-11-30 ENCOUNTER — Other Ambulatory Visit: Payer: Self-pay | Admitting: Family

## 2019-11-30 MED ORDER — INSULIN DEGLUDEC 100 UNIT/ML ~~LOC~~ SOPN: 8.0000 [IU] | PEN_INJECTOR | Freq: Every day | SUBCUTANEOUS | 3 refills | Status: DC

## 2019-12-14 ENCOUNTER — Other Ambulatory Visit: Payer: Self-pay | Admitting: Family

## 2019-12-14 DIAGNOSIS — IMO0002 Reserved for concepts with insufficient information to code with codable children: Secondary | ICD-10-CM

## 2019-12-14 MED ORDER — FREESTYLE LIBRE 2 READER DEVI: 1.0000 | Freq: Three times a day (TID) | 1 refills | Status: DC

## 2019-12-14 MED ORDER — FREESTYLE LIBRE 2 SENSOR MISC: 1.0000 "application " | Freq: Three times a day (TID) | 2 refills | Status: DC | PRN

## 2019-12-17 ENCOUNTER — Other Ambulatory Visit: Payer: Self-pay | Admitting: Family

## 2020-01-19 DIAGNOSIS — I495 Sick sinus syndrome: Secondary | ICD-10-CM | POA: Diagnosis not present

## 2020-01-19 DIAGNOSIS — Z45018 Encounter for adjustment and management of other part of cardiac pacemaker: Secondary | ICD-10-CM | POA: Diagnosis not present

## 2020-01-23 ENCOUNTER — Other Ambulatory Visit: Payer: Self-pay | Admitting: *Deleted

## 2020-01-23 DIAGNOSIS — I152 Hypertension secondary to endocrine disorders: Secondary | ICD-10-CM

## 2020-02-05 ENCOUNTER — Encounter: Payer: Self-pay | Admitting: "Endocrinology

## 2020-02-05 ENCOUNTER — Other Ambulatory Visit: Payer: Self-pay

## 2020-02-05 ENCOUNTER — Ambulatory Visit (INDEPENDENT_AMBULATORY_CARE_PROVIDER_SITE_OTHER): Payer: Medicare PPO | Admitting: "Endocrinology

## 2020-02-05 VITALS — BP 126/74 | HR 68 | Ht 61.0 in | Wt 137.0 lb

## 2020-02-05 DIAGNOSIS — E1165 Type 2 diabetes mellitus with hyperglycemia: Secondary | ICD-10-CM

## 2020-02-05 MED ORDER — TRESIBA FLEXTOUCH 100 UNIT/ML ~~LOC~~ SOPN: 20.0000 [IU] | PEN_INJECTOR | Freq: Every day | SUBCUTANEOUS | 1 refills | Status: DC

## 2020-02-05 MED ORDER — BD PEN NEEDLE NANO U/F 32G X 4 MM MISC: 1.0000 | Freq: Four times a day (QID) | 2 refills | Status: DC

## 2020-02-05 NOTE — Progress Notes (Signed)
Endocrinology Consult Note       02/05/2020, 3:57 PM   Subjective:    Patient ID: Kathryn Henson, female    DOB: September 13, 1922.  Kathryn Henson is being seen in consultation for management of currently uncontrolled symptomatic diabetes requested by  Sharion Balloon, FNP.   Past Medical History:  Diagnosis Date  . Breast cancer ([place])   . Cancer ([place])    left breast  . Diabetes mellitus without complication ([place])   . Glaucoma   . Hyperlipidemia   . Hypertension   . Osteoporosis   . Pacemaker   . Personal history of radiation therapy 04/2001    Past Surgical History:  Procedure Laterality Date  . BREAST BIOPSY Left 03/11/2001  . BREAST LUMPECTOMY Left 03/25/2001  . Lumpectomy left breast    . PACEMAKER INSERTION  04/08/2007   serial [id number], model 5826    Social History   Socioeconomic History  . Marital status: Widowed    Spouse name: Not on file  . Number of children: 3  . Years of education: 16  . Highest education level: Bachelor's degree (e.g., BA, AB, BS)  Occupational History  . Occupation: REtired    Comment: Pharmacist, hospital  Tobacco Use  . Smoking status: Former Smoker    Types: Cigarettes    Quit date: 05/25/1968    Years since quitting: 51.7  . Smokeless tobacco: Never Used  Vaping Use  . Vaping Use: Never used  Substance and Sexual Activity  . Alcohol use: No  . Drug use: No  . Sexual activity: Not Currently  Other Topics Concern  . Not on file  Social History Narrative  . Not on file   Social Determinants of Health   Financial Resource Strain:   . Difficulty of Paying Living Expenses: Not on file  Food Insecurity:   . Worried About Charity fundraiser in the Last Year: Not on file  . Ran Out of Food in the Last Year: Not on file  Transportation Needs:   . Lack of Transportation (Medical): Not on file  . Lack of Transportation (Non-Medical): Not on file  Physical  Activity:   . Days of Exercise per Week: Not on file  . Minutes of Exercise per Session: Not on file  Stress:   . Feeling of Stress : Not on file  Social Connections:   . Frequency of Communication with Friends and Family: Not on file  . Frequency of Social Gatherings with Friends and Family: Not on file  . Attends Religious Services: Not on file  . Active Member of Clubs or Organizations: Not on file  . Attends Archivist Meetings: Not on file  . Marital Status: Not on file    Family History  Problem Relation Age of Onset  . Heart disease Mother   . Diabetes Mother   . Stroke Father   . Breast cancer Sister   . Breast cancer Daughter 49  . Sarcoidosis Son   . Breast cancer Sister   . Breast cancer Other     Outpatient Encounter Medications as of 02/05/2020  Medication Sig  .  glimepiride (AMARYL) 4 MG tablet Take 2 mg by mouth daily with breakfast.  . aspirin 81 MG tablet Take 81 mg by mouth every morning.   . Cholecalciferol (VITAMIN D-1000 MAX ST) 1000 UNITS tablet Take 1,000 Units by mouth every morning.   . Coenzyme Q10 (COQ-10) 200 MG CAPS 200 mg. Take 200 mg by mouth daily.  . fluconazole (DIFLUCAN) 150 MG tablet Take 1 tablet (150 mg total) by mouth every three (3) days as needed.  . insulin degludec (TRESIBA FLEXTOUCH) 100 UNIT/ML FlexTouch Pen Inject 20 Units into the skin daily with lunch.  . Insulin Pen Needle (BD PEN NEEDLE NANO U/F) 32G X 4 MM MISC 1 each by Does not apply route 4 (four) times daily.  Marland Kitchen lisinopril (ZESTRIL) 10 MG tablet Take 1 tablet (10 mg total) by mouth every morning.  . metoprolol tartrate (LOPRESSOR) 25 MG tablet Take 1 tablet (25 mg total) by mouth 2 (two) times daily.  . nitroGLYCERIN (NITROSTAT) 0.4 MG SL tablet Place 0.4 mg under the tongue every 5 (five) minutes as needed for chest pain.  . rosuvastatin (CRESTOR) 5 MG tablet Take 1 tablet (5 mg total) by mouth every morning.  . [DISCONTINUED] Continuous Blood Gluc Receiver  (FREESTYLE LIBRE 2 READER) DEVI 1 Device by Does not apply route in the morning, at noon, and at bedtime.  . [DISCONTINUED] Continuous Blood Gluc Sensor (FREESTYLE LIBRE 2 SENSOR) MISC 1 application by Does not apply route 3 (three) times daily as needed.  . [DISCONTINUED] glimepiride (AMARYL) 2 MG tablet TAKE 1 TABLET BY MOUTH TWICE DAILY WITH A MEAL.  Needs to be seen for further refills.  . [DISCONTINUED] insulin degludec (TRESIBA) 100 UNIT/ML FlexTouch Pen Inject 0.08 mLs (8 Units total) into the skin daily.   No facility-administered encounter medications on file as of 02/05/2020.    ALLERGIES: Allergies  Allergen Reactions  . Atorvastatin     Other reaction(s): Myalgias (intolerance)  . Fluzone  [Flu Virus Vaccine] Swelling  . Metformin Nausea Only  . Sulfa Antibiotics Other (See Comments)    unknown    VACCINATION STATUS: Immunization History  Administered Date(s) Administered  . HPV Bivalent 04/06/1994  . Hepatitis B 06/12/1999, 08/06/1999, 11/12/1999  . Influenza-Unspecified 04/09/1997, 04/09/2000  . Moderna SARS-COVID-2 Vaccination 06/29/2019, 07/31/2019  . PPD Test 03/03/2016  . Pneumococcal Polysaccharide-23 05/25/1994  . Td 06/28/1989, 11/22/2009    Diabetes She presents for her initial diabetic visit. She has type 2 diabetes mellitus. Onset time: She was diagnosed at approximate age of 23 years. Her disease course has been improving. There are no hypoglycemic associated symptoms. Pertinent negatives for hypoglycemia include no confusion, headaches, pallor or seizures. Associated symptoms include fatigue, polydipsia and polyuria. Pertinent negatives for diabetes include no chest pain and no polyphagia. There are no hypoglycemic complications. Symptoms are worsening. Risk factors for coronary artery disease include diabetes mellitus, dyslipidemia, hypertension, family history, post-menopausal and sedentary lifestyle. Current diabetic treatment includes oral agent  (monotherapy) (She is currently on glimepiride 4 mg p.o. twice daily.). Her weight is fluctuating minimally. She is following a generally unhealthy diet. When asked about meal planning, she reported none. She has not had a previous visit with a dietitian. She participates in exercise intermittently. Her breakfast blood glucose range is generally >200 mg/dl. Her overall blood glucose range is >200 mg/dl. (She presents her meter showing average blood glucose between 257-270 for the last 30 days.  No hypoglycemia.  Her recent A1c was 11.7%.) An ACE inhibitor/angiotensin II receptor  blocker is being taken. Eye exam is current.  Hyperlipidemia This is a chronic problem. The current episode started more than 1 year ago. The problem is uncontrolled. Pertinent negatives include no chest pain, myalgias or shortness of breath. Current antihyperlipidemic treatment includes statins. Risk factors for coronary artery disease include dyslipidemia, diabetes mellitus, hypertension, a sedentary lifestyle and post-menopausal.  Hypertension This is a chronic problem. The current episode started more than 1 year ago. Pertinent negatives include no chest pain, headaches, palpitations or shortness of breath. Risk factors for coronary artery disease include dyslipidemia, diabetes mellitus, family history, sedentary lifestyle and post-menopausal state. Past treatments include ACE inhibitors.     Review of Systems  Constitutional: Positive for fatigue. Negative for chills, fever and unexpected weight change.  HENT: Negative for trouble swallowing and voice change.   Eyes: Negative for visual disturbance.  Respiratory: Negative for cough, shortness of breath and wheezing.   Cardiovascular: Negative for chest pain, palpitations and leg swelling.  Gastrointestinal: Negative for diarrhea, nausea and vomiting.  Endocrine: Positive for polydipsia and polyuria. Negative for cold intolerance, heat intolerance and polyphagia.   Musculoskeletal: Negative for arthralgias and myalgias.  Skin: Negative for color change, pallor, rash and wound.  Neurological: Negative for seizures and headaches.  Psychiatric/Behavioral: Negative for confusion and suicidal ideas.    Objective:    Vitals with BMI 02/05/2020 11/28/2019 07/11/2019  Height 5\' 1"  5\' 1"  -  Weight 137 lbs 137 lbs 13 oz -  BMI 83.1 51.76 -  Systolic 160 737 -  Diastolic 74 69 -  Pulse 68 83 74    BP 126/74   Pulse 68   Ht 5\' 1"  (1.549 m)   Wt 137 lb (62.1 kg)   BMI 25.89 kg/m   Wt Readings from Last 3 Encounters:  02/05/20 137 lb (62.1 kg)  11/28/19 137 lb 12.8 oz (62.5 kg)  07/11/19 137 lb (62.1 kg)     Physical Exam Constitutional:      Appearance: She is well-developed.  HENT:     Head: Normocephalic and atraumatic.  Neck:     Thyroid: No thyromegaly.     Trachea: No tracheal deviation.  Cardiovascular:     Rate and Rhythm: Normal rate and regular rhythm.  Pulmonary:     Effort: Pulmonary effort is normal.  Abdominal:     Tenderness: There is no abdominal tenderness. There is no guarding.  Musculoskeletal:        General: Normal range of motion.     Cervical back: Normal range of motion and neck supple.  Skin:    General: Skin is warm and dry.     Coloration: Skin is not pale.     Findings: No erythema or rash.  Neurological:     Mental Status: She is alert and oriented to person, place, and time.     Cranial Nerves: No cranial nerve deficit.     Coordination: Coordination normal.     Deep Tendon Reflexes: Reflexes are normal and symmetric.  Psychiatric:        Judgment: Judgment normal.     CMP ( most recent) CMP     Component Value Date/Time   NA 138 11/28/2019 1641   K 4.7 11/28/2019 1641   CL 103 11/28/2019 1641   CO2 23 11/28/2019 1641   GLUCOSE 536 (HH) 11/28/2019 1641   GLUCOSE 268 (H) 07/11/2019 1553   BUN 21 11/28/2019 1641   CREATININE 0.92 11/28/2019 1641   CREATININE 0.96 11/10/2012 0915  CALCIUM 9.1  11/28/2019 1641   PROT 6.8 11/28/2019 1641   ALBUMIN 3.7 11/28/2019 1641   AST 11 11/28/2019 1641   ALT 17 11/28/2019 1641   ALKPHOS 102 11/28/2019 1641   BILITOT 0.3 11/28/2019 1641   GFRNONAA 53 (L) 11/28/2019 1641   GFRNONAA 53 (L) 11/10/2012 0915   GFRAA 61 11/28/2019 1641   GFRAA 61 11/10/2012 0915     Diabetic Labs (most recent): Lab Results  Component Value Date   HGBA1C 11.7 (H) 11/28/2019   HGBA1C 9.9 (H) 04/13/2019   HGBA1C 10.4 (H) 02/10/2019     Lipid Panel ( most recent) Lipid Panel     Component Value Date/Time   CHOL 207 (H) 11/28/2019 1641   CHOL 204 (H) 11/10/2012 0915   TRIG 179 (H) 11/28/2019 1641   TRIG 69 09/04/2013 1438   TRIG 55 11/10/2012 0915   HDL 64 11/28/2019 1641   HDL 71 09/04/2013 1438   HDL 63 11/10/2012 0915   CHOLHDL 3.2 11/28/2019 1641   LDLCALC 112 (H) 11/28/2019 1641   LDLCALC 159 (H) 09/04/2013 1438   LDLCALC 130 (H) 11/10/2012 0915   LABVLDL 31 11/28/2019 1641      Lab Results  Component Value Date   TSH 2.520 02/10/2019   TSH 2.240 03/12/2017      Assessment & Plan:   1. Uncontrolled type 2 diabetes mellitus with hyperglycemia ([place])  - Mamta Rimmer Graddy has currently uncontrolled symptomatic type 2 DM since  86 years of age,  with most recent A1c of 11.7 %. Recent labs reviewed. - I had a long discussion with her about the progressive nature of diabetes and the pathology behind its complications. She presents her meter showing average blood glucose between 257-270 for the last 30 days.  No hypoglycemia.  Her recent A1c was 11.7%.  -She does not report gross complications from diabetes, however she remains at a high risk for more acute and chronic complications which include CAD, CVA, CKD, retinopathy, and neuropathy. These are all discussed in detail with her.  - I have counseled her on diet  and weight management  by adopting a carbohydrate restricted/protein rich diet. Patient is encouraged to switch to  unprocessed  or minimally processed     complex starch and increased protein intake (animal or plant source), fruits, and vegetables. -  she is advised to stick to a routine mealtimes to eat 3 meals  a day and avoid unnecessary snacks ( to snack only to correct hypoglycemia).   - she admits that there is a room for improvement in her food and drink choices. - Suggestion is made for her to avoid simple carbohydrates  from her diet including Cakes, Sweet Desserts, Ice Cream, Soda (diet and regular), Sweet Tea, Candies, Chips, Cookies, Store Bought Juices, Alcohol in Excess of  1-2 drinks a day, Artificial Sweeteners,  Coffee Creamer, and "Sugar-free" Products. This will help patient to have more stable blood glucose profile and potentially avoid unintended weight gain.  - she will be scheduled with Jearld Fenton, RDN, CDE for diabetes education.  - I have approached her with the following individualized plan to manage  her diabetes and patient agrees:   -In light of her prevailing glycemic burden, she will need insulin treatment in order for her to achieve control of diabetes to target.  I have demonstrated insulin use technique in the exam room with her daughter. -She has support from her daughters and from an aide.  -I discussed and  initiated her on Tresiba 20 units daily at lunch associated with monitoring of blood glucose 4 times a day-daily before breakfast, lunch, supper, and bedtime and return in 1 week with her meter and logs for reevaluation.   - she is encouraged to call clinic for blood glucose levels less than 70 or above 300 mg /dl. -She is advised to lower her glimepiride to 2 mg p.o. daily with breakfast. -She will benefit from a CGM.  She will be considered for the [place] device next time. - Specific targets for  A1c;  LDL, HDL,  and Triglycerides were discussed with the patient.  2) Blood Pressure /Hypertension:  her blood pressure is  controlled to target.   she is advised to continue her  current medications including lisinopril 10 mg p.o. daily with breakfast . 3) Lipids/Hyperlipidemia:   Review of her recent lipid panel showed uncontrolled  LDL at 159 .  she  is advised to continue    Crestor 5 mg daily at bedtime.  Side effects and precautions discussed with her.  4)  Weight/Diet:  Body mass index is 25.89 kg/m.     she is not a candidate for major weight loss. Exercise, and detailed carbohydrates information provided  -  detailed on discharge instructions.  5) Chronic Care/Health Maintenance:  -she  is on ACEI/ARB and Statin medications and  is encouraged to initiate and continue to follow up with Ophthalmology, Dentist,  Podiatrist at least yearly or according to recommendations, and advised to   stay away from smoking. I have recommended yearly flu vaccine and pneumonia vaccine at least every 5 years; moderate intensity exercise for up to 150 minutes weekly; and  sleep for at least 7 hours a day.  - she is  advised to maintain close follow up with Sharion Balloon, FNP for primary care needs, as well as her other providers for optimal and coordinated care.   - Time spent in this patient care: 60 min, of which > 50% was spent in  counseling  her about her type 2 diabetes, hyperlipidemia, hypertension and the rest reviewing her blood glucose logs , discussing her hypoglycemia and hyperglycemia episodes, reviewing her current and  previous labs / studies  ( including abstraction from other facilities) and medications  doses and developing a  long term treatment plan based on the latest standards of care/ guidelines; and documenting her care.    Please refer to Patient Instructions for Blood Glucose Monitoring and Insulin/Medications Dosing Guide"  in media tab for additional information. Please  also refer to " Patient Self Inventory" in the Media  tab for reviewed elements of pertinent patient history.  Lysle Dingwall Clapp participated in the discussions, expressed understanding,  and voiced agreement with the above plans.  All questions were answered to her satisfaction. she is encouraged to contact clinic should she have any questions or concerns prior to her return visit.   Follow up plan: - Return in about 1 week (around 02/12/2020) for F/U with Meter and Logs Only - no Labs.  Glade Lloyd, MD [facility] [address] [place], [place] [address] Phone: [phone]  Fax: [phone]    02/05/2020, 3:57 PM  This note was partially dictated with voice recognition software. Similar sounding words can be transcribed inadequately or may not  be corrected upon review.

## 2020-02-05 NOTE — Patient Instructions (Signed)

## 2020-02-12 ENCOUNTER — Ambulatory Visit: Payer: Medicare PPO | Admitting: "Endocrinology

## 2020-02-14 ENCOUNTER — Other Ambulatory Visit: Payer: Self-pay

## 2020-02-14 ENCOUNTER — Emergency Department (HOSPITAL_COMMUNITY): Payer: Medicare PPO

## 2020-02-14 ENCOUNTER — Encounter (HOSPITAL_COMMUNITY): Payer: Self-pay | Admitting: Emergency Medicine

## 2020-02-14 ENCOUNTER — Emergency Department (HOSPITAL_COMMUNITY)
Admission: EM | Admit: 2020-02-14 | Discharge: 2020-02-14 | Disposition: A | Discharge status: Home / Self Care | Payer: Medicare PPO | Attending: Emergency Medicine | Admitting: Emergency Medicine

## 2020-02-14 DIAGNOSIS — Z794 Long term (current) use of insulin: Secondary | ICD-10-CM | POA: Diagnosis not present

## 2020-02-14 DIAGNOSIS — E119 Type 2 diabetes mellitus without complications: Secondary | ICD-10-CM | POA: Diagnosis not present

## 2020-02-14 DIAGNOSIS — I10 Essential (primary) hypertension: Secondary | ICD-10-CM | POA: Insufficient documentation

## 2020-02-14 DIAGNOSIS — Z79899 Other long term (current) drug therapy: Secondary | ICD-10-CM | POA: Diagnosis not present

## 2020-02-14 DIAGNOSIS — J90 Pleural effusion, not elsewhere classified: Secondary | ICD-10-CM | POA: Diagnosis not present

## 2020-02-14 DIAGNOSIS — R55 Syncope and collapse: Secondary | ICD-10-CM | POA: Diagnosis not present

## 2020-02-14 DIAGNOSIS — Z87891 Personal history of nicotine dependence: Secondary | ICD-10-CM | POA: Diagnosis not present

## 2020-02-14 DIAGNOSIS — E1165 Type 2 diabetes mellitus with hyperglycemia: Secondary | ICD-10-CM | POA: Diagnosis not present

## 2020-02-14 DIAGNOSIS — W19XXXA Unspecified fall, initial encounter: Secondary | ICD-10-CM | POA: Diagnosis not present

## 2020-02-14 DIAGNOSIS — I517 Cardiomegaly: Secondary | ICD-10-CM | POA: Diagnosis not present

## 2020-02-14 DIAGNOSIS — Z7982 Long term (current) use of aspirin: Secondary | ICD-10-CM | POA: Diagnosis not present

## 2020-02-14 LAB — COMPREHENSIVE METABOLIC PANEL
ALT: 17 U/L (ref 0–44)
AST: 15 U/L (ref 15–41)
Albumin: 3.4 g/dL — ABNORMAL LOW (ref 3.5–5.0)
Alkaline Phosphatase: 68 U/L (ref 38–126)
Anion gap: 8 (ref 5–15)
BUN: 23 mg/dL (ref 8–23)
CO2: 26 mmol/L (ref 22–32)
Calcium: 8.6 mg/dL — ABNORMAL LOW (ref 8.9–10.3)
Chloride: 103 mmol/L (ref 98–111)
Creatinine, Ser: 1.08 mg/dL — ABNORMAL HIGH (ref 0.44–1.00)
GFR calc Af Amer: 51 mL/min — ABNORMAL LOW (ref >60)
GFR calc non Af Amer: 44 mL/min — ABNORMAL LOW (ref >60)
Glucose, Bld: 317 mg/dL — ABNORMAL HIGH (ref 70–99)
Potassium: 4.7 mmol/L (ref 3.5–5.1)
Sodium: 137 mmol/L (ref 135–145)
Total Bilirubin: 0.7 mg/dL (ref 0.3–1.2)
Total Protein: 6.5 g/dL (ref 6.5–8.1)

## 2020-02-14 LAB — CBC WITH DIFFERENTIAL/PLATELET
Abs Immature Granulocytes: 0.04 10*3/uL (ref 0.00–0.07)
Basophils Absolute: 0 10*3/uL (ref 0.0–0.1)
Basophils Relative: 0 %
Eosinophils Absolute: 0 10*3/uL (ref 0.0–0.5)
Eosinophils Relative: 0 %
HCT: 39 % (ref 36.0–46.0)
Hemoglobin: 12.3 g/dL (ref 12.0–15.0)
Immature Granulocytes: 1 %
Lymphocytes Relative: 11 %
Lymphs Abs: 0.8 10*3/uL (ref 0.7–4.0)
MCH: 31.1 pg (ref 26.0–34.0)
MCHC: 31.5 g/dL (ref 30.0–36.0)
MCV: 98.7 fL (ref 80.0–100.0)
Monocytes Absolute: 0.4 10*3/uL (ref 0.1–1.0)
Monocytes Relative: 5 %
Neutro Abs: 6.6 10*3/uL (ref 1.7–7.7)
Neutrophils Relative %: 83 %
Platelets: 156 10*3/uL (ref 150–400)
RBC: 3.95 MIL/uL (ref 3.87–5.11)
RDW: 13.7 % (ref 11.5–15.5)
WBC: 7.9 10*3/uL (ref 4.0–10.5)
nRBC: 0 % (ref 0.0–0.2)

## 2020-02-14 LAB — CBG MONITORING, ED: Glucose-Capillary: 307 mg/dL — ABNORMAL HIGH (ref 70–99)

## 2020-02-14 NOTE — ED Triage Notes (Signed)
Pt presents to ED via [facility] for syncopal episode at home. Family stated to EMS that pt lost consciousness and fell getting out of the shower. EMS states pt was alert on their arrival. Pt is currently alert and responsive to verbal stimuli, also oriented to place and time.

## 2020-02-14 NOTE — ED Provider Notes (Signed)
[facility] Provider Note   CSN: [id number] Arrival date & time: 02/14/20  1323     History Chief Complaint  Patient presents with  . Loss of Consciousness      Kathryn Henson is a 86 y.o. female presenting to the emergency department complaining of syncope.   has a past medical history of Breast cancer ([place]), Cancer ([place]), Diabetes mellitus without complication ([place]), Glaucoma, Hyperlipidemia, Hypertension, Osteoporosis, Pacemaker, and Personal history of radiation therapy (04/2001).  History is limited due to amnesia to event The onset of the symptoms was  abrupt starting 1 hour ago.  Hx given bu EMS. Patient was showering and had LOC afterward. No seizure like activity. Unsure if she fell or hit her head. She is not on blood thinners. She has had episodes of the same. EMS states no post ictal state.  The patient has associated No associated sxs.  The symptoms have been  intermittent, completely resolved.    The patient denies pain.     HPI     Past Medical History:  Diagnosis Date  . Breast cancer ([place])   . Cancer ([place])    left breast  . Diabetes mellitus without complication ([place])   . Glaucoma   . Hyperlipidemia   . Hypertension   . Osteoporosis   . Pacemaker   . Personal history of radiation therapy 04/2001    Patient Active Problem List   Diagnosis Date Noted  . Uncontrolled type 2 diabetes mellitus with hyperglycemia ([place]) 02/05/2020  . Arthritis 08/11/2013  . Swelling of ankle 08/11/2013  . Seborrheic keratoses, inflamed 05/29/2013  . Cellulitis 05/29/2013  . Allergic rhinitis 05/13/2013  . Fatigue 05/13/2013  . Hyperlipidemia associated with type 2 diabetes mellitus ([place]) 05/13/2013  . Type 2 diabetes mellitus with complication, without long-term current use of insulin ([place]) 05/13/2013  . Cancer ([facility])   . Osteoporosis   . DM (diabetes mellitus), secondary uncontrolled ([place])   . Glaucoma   . Hypertension associated with diabetes  ([place])   . Pacemaker   . Syncope and collapse 09/10/2012  . Syncope 09/10/2012  . Stress-induced cardiomyopathy 11/17/2011  . Takotsubo syndrome 11/17/2011    Past Surgical History:  Procedure Laterality Date  . BREAST BIOPSY Left 03/11/2001  . BREAST LUMPECTOMY Left 03/25/2001  . Lumpectomy left breast    . PACEMAKER INSERTION  04/08/2007   serial [id number], model 5826     OB History   No obstetric history on file.     Family History  Problem Relation Age of Onset  . Heart disease Mother   . Diabetes Mother   . Stroke Father   . Breast cancer Sister   . Breast cancer Daughter 91  . Sarcoidosis Son   . Breast cancer Sister   . Breast cancer Other     Social History   Tobacco Use  . Smoking status: Former Smoker    Types: Cigarettes    Quit date: 05/25/1968    Years since quitting: 51.7  . Smokeless tobacco: Never Used  Vaping Use  . Vaping Use: Never used  Substance Use Topics  . Alcohol use: No  . Drug use: No    Home Medications Prior to Admission medications   Medication Sig Start Date End Date Taking? Authorizing Provider  aspirin 81 MG tablet Take 81 mg by mouth every morning.     [provider]  Cholecalciferol (VITAMIN D-1000 MAX ST) 1000 UNITS tablet Take 1,000 Units by mouth every morning.  [provider]  Coenzyme Q10 (COQ-10) 200 MG CAPS 200 mg. Take 200 mg by mouth daily.    [provider]  glimepiride (AMARYL) 4 MG tablet Take 2 mg by mouth daily with breakfast. 11/29/19   [provider]  insulin degludec (TRESIBA FLEXTOUCH) 100 UNIT/ML FlexTouch Pen Inject 20 Units into the skin daily with lunch. 02/05/20   Cassandria Anger, MD  Insulin Pen Needle (BD PEN NEEDLE NANO U/F) 32G X 4 MM MISC 1 each by Does not apply route 4 (four) times daily. 02/05/20   Cassandria Anger, MD  lisinopril (ZESTRIL) 10 MG tablet Take 1 tablet (10 mg total) by mouth every morning. 11/28/19   Evelina Dun A, FNP  metoprolol  tartrate (LOPRESSOR) 25 MG tablet Take 1 tablet (25 mg total) by mouth 2 (two) times daily. 11/28/19   Sharion Balloon, FNP  nitroGLYCERIN (NITROSTAT) 0.4 MG SL tablet Place 0.4 mg under the tongue every 5 (five) minutes as needed for chest pain.    [provider]  rosuvastatin (CRESTOR) 5 MG tablet Take 1 tablet (5 mg total) by mouth every morning. 11/28/19   Sharion Balloon, FNP    Allergies    Atorvastatin, Fluzone  [flu virus vaccine], Metformin, and Sulfa antibiotics  Review of Systems   Review of Systems Ten systems reviewed and are negative for acute change, except as noted in the HPI.   Physical Exam Updated Vital Signs SpO2 100%   Physical Exam Vitals and nursing note reviewed.  Constitutional:      General: She is not in acute distress.    Appearance: She is well-developed. She is not diaphoretic.  HENT:     Head: Normocephalic and atraumatic.  Eyes:     General: No scleral icterus.    Conjunctiva/sclera: Conjunctivae normal.  Cardiovascular:     Rate and Rhythm: Normal rate and regular rhythm.     Heart sounds: Normal heart sounds. No murmur heard.  No friction rub. No gallop.   Pulmonary:     Effort: Pulmonary effort is normal. No respiratory distress.     Breath sounds: Normal breath sounds.  Abdominal:     General: Bowel sounds are normal. There is no distension.     Palpations: Abdomen is soft. There is no mass.     Tenderness: There is no abdominal tenderness. There is no guarding.  Musculoskeletal:     Cervical back: Normal range of motion.  Skin:    General: Skin is warm and dry.  Neurological:     Mental Status: She is alert and oriented to person, place, and time.  Psychiatric:        Behavior: Behavior normal.     ED Results / Procedures / Treatments   Labs (all labs ordered are listed, but only abnormal results are displayed) Labs Reviewed - No data to display  EKG None  Radiology No results found.  Procedures Procedures  (including critical care time)  Medications Ordered in ED Medications - No data to display  ED Course  I have reviewed the triage vital signs and the nursing notes.  Pertinent labs & imaging results that were available during my care of the patient were reviewed by me and considered in my medical decision making (see chart for details).    MDM Rules/Calculators/A&P                          DX:AJOINOM VS:  Vitals:  02/14/20 1340 02/14/20 1430 02/14/20 1500 02/14/20 1900  BP:  134/79 134/68 140/87  Pulse:  71 66 67  Resp:  13 15 16   Temp:      TempSrc:      SpO2:  95% 98% 99%  Weight: 61.2 kg     Height: 5\' 1"  (1.549 m)      XB:MWUXLKG is gathered by patient  and emr. Previous records obtained and reviewed. DDX:The patient's complaint of syncope involves an extensive number of diagnostic and treatment options, and is a complaint that carries with it a high risk of complications, morbidity, and potential mortality. Given the large differential diagnosis, medical decision making is of high complexity. The differential for syncope is extensive and includes, but is not limited to: arrythmia (Vtach, SVT, SSS, sinus arrest, AV block, bradycardia) aortic stenosis, AMI, HOCM, PE, atrial myxoma, pulmonary hypertension, orthostatic hypotension, (hypovolemia, drug effect, GB syndrome, micturition, cough, swall) carotid sinus sensitivity, Seizure, TIA/CVA, hypoglycemia,  Vertigo.  Labs: I ordered reviewed and interpreted labs which included Cbc-wnl cmp-elevated glucose and baseline renal insufficiency Imaging: I ordered and reviewed images which included ct head/ cspine/cxr. I independently visualized and interpreted all imaging. There are no acute, significant findings on today's images. EKG:NSR 63    Consults: MWN:UUVOZDG here with syncope after shower- Her pacemaker was interrogated and showed no evidence of arrhythmia, bradycardia or other cardiogenic cause of syncope.  She has a history  of syncope and collapse.  This may have been due to vasodilation due to warm shower, vasovagal event.  She did not have any evidence of acute symptomatic anemia.  Case discussed with Dr. Sedonia Small was in agreement with plan for discharge at this time. Patient disposition:The patient appears reasonably screened and/or stabilized for discharge and I doubt any other medical condition or other [facility] requiring further screening, evaluation, or treatment in the ED at this time prior to discharge. I have discussed lab and/or imaging findings with the patient and answered all questions/concerns to the best of my ability.I have discussed return precautions and OP follow up.    Final Clinical Impression(s) / ED Diagnoses Final diagnoses:  None    Rx / DC Orders ED Discharge Orders    None       Margarita Mail, PA-C 02/15/20 2217    Maudie Flakes, MD 02/17/20 [phone]

## 2020-02-14 NOTE — Progress Notes (Signed)
Inpatient Diabetes Program Recommendations  AACE/ADA: New Consensus Statement on Inpatient Glycemic Control (2015)  Target Ranges:  Prepandial:   less than 140 mg/dL      Peak postprandial:   less than 180 mg/dL (1-2 hours)      Critically ill patients:  140 - 180 mg/dL   Lab Results  Component Value Date   GLUCAP 307 (H) 02/14/2020   HGBA1C 11.7 (H) 11/28/2019    Review of Glycemic Control  Diabetes history: DM 2, (Sees Dr. Dorris Fetch, Endocrinology) Outpatient Diabetes medications: Amaryl 2 mg Daily (prescribed 4 mg Daily) Current orders for Inpatient glycemic control:  ED evaluation currently  Note: Dr. Dorris Fetch wanted her to start tresiba 20 and amaryl 2 daily pt did not want to do tresiba, instead will focus on lifestyle modifications 9/13. Pt known to be noncompliant with diet. A1c 11.7% on 7/6. Scheduled to see dietitian Jearld Fenton, CDE outpatient for education. Daughter helps pt at home  Inpatient Diabetes Program Recommendations:    Levemir 15 units Novolog 0-9 units tid + hs  Thanks,  Tama Headings RN, MSN, BC-ADM Inpatient Diabetes Coordinator Team Pager [phone] (8a-5p)

## 2020-02-14 NOTE — Discharge Instructions (Addendum)
Get help right away if you: Have a severe headache. Faint once or repeatedly. Have pain in your chest, abdomen, or back. Have a very fast or irregular heartbeat (palpitations). Have pain when you breathe. Are bleeding from your mouth or rectum, or you have black or tarry stool. Have a seizure. Are confused. Have trouble walking. Have severe weakness. Have vision problems.

## 2020-04-28 ENCOUNTER — Other Ambulatory Visit: Payer: Self-pay | Admitting: Family

## 2020-05-30 ENCOUNTER — Ambulatory Visit: Payer: Medicare PPO | Admitting: Family

## 2020-06-10 ENCOUNTER — Other Ambulatory Visit: Payer: Self-pay

## 2020-06-10 ENCOUNTER — Ambulatory Visit (INDEPENDENT_AMBULATORY_CARE_PROVIDER_SITE_OTHER): Payer: Medicare PPO | Admitting: Family

## 2020-06-10 NOTE — Progress Notes (Signed)
Called patient. Daughter states she thought this visit was cancelled because of the snow. Would like to reschedule.   Evelina Dun, FNP

## 2020-06-17 ENCOUNTER — Other Ambulatory Visit: Payer: Self-pay

## 2020-06-17 ENCOUNTER — Telehealth: Payer: Self-pay

## 2020-06-17 ENCOUNTER — Encounter: Payer: Self-pay | Admitting: Family

## 2020-06-17 ENCOUNTER — Ambulatory Visit (INDEPENDENT_AMBULATORY_CARE_PROVIDER_SITE_OTHER): Payer: Medicare PPO | Admitting: Family

## 2020-06-17 DIAGNOSIS — E1165 Type 2 diabetes mellitus with hyperglycemia: Secondary | ICD-10-CM

## 2020-06-17 DIAGNOSIS — Z950 Presence of cardiac pacemaker: Secondary | ICD-10-CM | POA: Diagnosis not present

## 2020-06-17 DIAGNOSIS — I152 Hypertension secondary to endocrine disorders: Secondary | ICD-10-CM

## 2020-06-17 DIAGNOSIS — E1159 Type 2 diabetes mellitus with other circulatory complications: Secondary | ICD-10-CM | POA: Diagnosis not present

## 2020-06-17 DIAGNOSIS — E785 Hyperlipidemia, unspecified: Secondary | ICD-10-CM

## 2020-06-17 DIAGNOSIS — M1990 Unspecified osteoarthritis, unspecified site: Secondary | ICD-10-CM

## 2020-06-17 DIAGNOSIS — E1169 Type 2 diabetes mellitus with other specified complication: Secondary | ICD-10-CM | POA: Diagnosis not present

## 2020-06-17 LAB — BAYER DCA HB A1C WAIVED: HB A1C (BAYER DCA - WAIVED): 11.5 % — ABNORMAL HIGH (ref <7.0)

## 2020-06-17 NOTE — Telephone Encounter (Signed)
Pt has already had televisit with [facility] today.

## 2020-06-17 NOTE — Progress Notes (Signed)
Virtual Visit via telephone Note Due to COVID-19 pandemic this visit was conducted virtually. This visit type was conducted due to national recommendations for restrictions regarding the COVID-19 Pandemic (e.g. social distancing, sheltering in place) in an effort to limit this patient's exposure and mitigate transmission in our community. All issues noted in this document were discussed and addressed.  A physical exam was not performed with this format.  I connected with Kathryn Henson's daughter  on 06/17/20 at 12:28 pm  by telephone and verified that I am speaking with the correct person using two identifiers. Kathryn Henson is currently located at home and daughter is currently with her during visit. The provider, Evelina Dun, FNP is located in their office at time of visit.  I discussed the limitations, risks, security and privacy concerns of performing an evaluation and management service by telephone and the availability of in person appointments. I also discussed with the patient that there may be a patient responsible charge related to this service. The patient expressed understanding and agreed to proceed.   History and Present Illness:  Pt calls the office today for chronic follow up. She is followed by her Cardiologists  for cardiomyopathy and has a pacemaker in place. Hypertension This is a chronic problem. The current episode started more than 1 year ago. The problem has been waxing and waning since onset. The problem is controlled. Associated symptoms include malaise/fatigue. Pertinent negatives include no blurred vision, peripheral edema or shortness of breath. Risk factors for coronary artery disease include dyslipidemia and sedentary lifestyle. The current treatment provides moderate improvement.  Diabetes She presents for her follow-up diabetic visit. She has type 2 diabetes mellitus. Her disease course has been stable. There are no hypoglycemic associated symptoms.  Pertinent negatives for diabetes include no blurred vision and no foot paresthesias. Symptoms are stable. Diabetic complications include heart disease. Risk factors for coronary artery disease include dyslipidemia, diabetes mellitus, hypertension, sedentary lifestyle and post-menopausal. She is following a generally unhealthy diet. Her overall blood glucose range is 180-200 mg/dl. An ACE inhibitor/angiotensin II receptor blocker is being taken.  Arthritis Presents for follow-up visit. She complains of pain and stiffness. The symptoms have been stable. Affected locations include the left knee, right knee, right MCP and left MCP. Her pain is at a severity of 0/10.  Hyperlipidemia This is a chronic problem. The current episode started more than 1 year ago. The problem is controlled. Recent lipid tests were reviewed and are normal. Pertinent negatives include no shortness of breath. Current antihyperlipidemic treatment includes statins. The current treatment provides moderate improvement of lipids. Risk factors for coronary artery disease include dyslipidemia, diabetes mellitus, hypertension, a sedentary lifestyle and post-menopausal.      Review of Systems  Constitutional: Positive for malaise/fatigue.  Eyes: Negative for blurred vision.  Respiratory: Negative for shortness of breath.   Musculoskeletal: Positive for arthritis and stiffness.  All other systems reviewed and are negative.    Observations/Objective: No SOB or distress noted   Assessment and Plan: Kathryn Henson comes in today with chief complaint of No chief complaint on file.   Diagnosis and orders addressed:  1. Hypertension associated with diabetes ([place]) - CMP14+EGFR; Future - CBC with Differential/Platelet; Future  2. Uncontrolled type 2 diabetes mellitus with hyperglycemia ([facility]) - Bayer DCA Hb A1c Waived; Future - CMP14+EGFR; Future - CBC with Differential/Platelet; Future - Microalbumin / creatinine urine ratio;  Future  3. Hyperlipidemia associated with type 2 diabetes mellitus ([place]) - CMP14+EGFR;  Future - CBC with Differential/Platelet; Future - Lipid panel; Future  4. Arthritis - CMP14+EGFR; Future - CBC with Differential/Platelet; Future  5. Pacemaker - CMP14+EGFR; Future - CBC with Differential/Platelet; Future   Labs pending Health Maintenance reviewed Diet and exercise encouraged  Follow up plan: 6 months         I discussed the assessment and treatment plan with the patient. The patient was provided an opportunity to ask questions and all were answered. The patient agreed with the plan and demonstrated an understanding of the instructions.   The patient was advised to call back or seek an in-person evaluation if the symptoms worsen or if the condition fails to improve as anticipated.  The above assessment and management plan was discussed with the patient. The patient verbalized understanding of and has agreed to the management plan. Patient is aware to call the clinic if symptoms persist or worsen. Patient is aware when to return to the clinic for a follow-up visit. Patient educated on when it is appropriate to go to the emergency department.   Time call ended:  12:49 pm  I provided 21 minutes of non-face-to-face time during this encounter.    Evelina Dun, FNP

## 2020-06-18 LAB — CMP14+EGFR
ALT: 25 IU/L (ref 0–32)
AST: 25 IU/L (ref 0–40)
Albumin/Globulin Ratio: 1.5 (ref 1.2–2.2)
Albumin: 4 g/dL (ref 3.5–4.6)
Alkaline Phosphatase: 102 IU/L (ref 44–121)
BUN/Creatinine Ratio: 19 (ref 12–28)
BUN: 21 mg/dL (ref 10–36)
Bilirubin Total: 0.4 mg/dL (ref 0.0–1.2)
CO2: 22 mmol/L (ref 20–29)
Calcium: 8.8 mg/dL (ref 8.7–10.3)
Chloride: 103 mmol/L (ref 96–106)
Creatinine, Ser: 1.09 mg/dL — ABNORMAL HIGH (ref 0.57–1.00)
GFR calc Af Amer: 50 mL/min/{1.73_m2} — ABNORMAL LOW (ref >59)
GFR calc non Af Amer: 43 mL/min/{1.73_m2} — ABNORMAL LOW (ref >59)
Globulin, Total: 2.7 g/dL (ref 1.5–4.5)
Glucose: 349 mg/dL — ABNORMAL HIGH (ref 65–99)
Potassium: 4.5 mmol/L (ref 3.5–5.2)
Sodium: 138 mmol/L (ref 134–144)
Total Protein: 6.7 g/dL (ref 6.0–8.5)

## 2020-06-18 LAB — CBC WITH DIFFERENTIAL/PLATELET
Basophils Absolute: 0 10*3/uL (ref 0.0–0.2)
Basos: 1 %
EOS (ABSOLUTE): 0.1 10*3/uL (ref 0.0–0.4)
Eos: 2 %
Hematocrit: 38.5 % (ref 34.0–46.6)
Hemoglobin: 12.6 g/dL (ref 11.1–15.9)
Immature Grans (Abs): 0 10*3/uL (ref 0.0–0.1)
Immature Granulocytes: 0 %
Lymphocytes Absolute: 1.5 10*3/uL (ref 0.7–3.1)
Lymphs: 37 %
MCH: 31 pg (ref 26.6–33.0)
MCHC: 32.7 g/dL (ref 31.5–35.7)
MCV: 95 fL (ref 79–97)
Monocytes Absolute: 0.3 10*3/uL (ref 0.1–0.9)
Monocytes: 8 %
Neutrophils Absolute: 2.2 10*3/uL (ref 1.4–7.0)
Neutrophils: 52 %
Platelets: 164 10*3/uL (ref 150–450)
RBC: 4.06 x10E6/uL (ref 3.77–5.28)
RDW: 12.8 % (ref 11.7–15.4)
WBC: 4.1 10*3/uL (ref 3.4–10.8)

## 2020-06-18 LAB — LIPID PANEL
Chol/HDL Ratio: 3.4 ratio (ref 0.0–4.4)
Cholesterol, Total: 203 mg/dL — ABNORMAL HIGH (ref 100–199)
HDL: 60 mg/dL (ref >39)
LDL Chol Calc (NIH): 121 mg/dL — ABNORMAL HIGH (ref 0–99)
Triglycerides: 126 mg/dL (ref 0–149)
VLDL Cholesterol Cal: 22 mg/dL (ref 5–40)

## 2020-06-18 LAB — MICROALBUMIN / CREATININE URINE RATIO
Creatinine, Urine: 128.4 mg/dL
Microalb/Creat Ratio: 6 mg/g creat (ref 0–29)
Microalbumin, Urine: 7.9 ug/mL

## 2020-07-19 DIAGNOSIS — Z45018 Encounter for adjustment and management of other part of cardiac pacemaker: Secondary | ICD-10-CM | POA: Diagnosis not present

## 2020-07-19 DIAGNOSIS — I495 Sick sinus syndrome: Secondary | ICD-10-CM | POA: Diagnosis not present

## 2020-07-19 DIAGNOSIS — R55 Syncope and collapse: Secondary | ICD-10-CM | POA: Diagnosis not present

## 2020-07-19 DIAGNOSIS — Z4502 Encounter for adjustment and management of automatic implantable cardiac defibrillator: Secondary | ICD-10-CM | POA: Diagnosis not present

## 2020-08-27 ENCOUNTER — Encounter: Payer: Self-pay | Admitting: Family

## 2020-08-27 ENCOUNTER — Ambulatory Visit: Payer: Medicare PPO | Admitting: Family

## 2020-08-27 ENCOUNTER — Other Ambulatory Visit: Payer: Self-pay

## 2020-08-27 VITALS — BP 118/70 | HR 77 | Temp 97.8°F | Ht 61.0 in | Wt 138.8 lb

## 2020-08-27 DIAGNOSIS — E785 Hyperlipidemia, unspecified: Secondary | ICD-10-CM

## 2020-08-27 DIAGNOSIS — B373 Candidiasis of vulva and vagina: Secondary | ICD-10-CM

## 2020-08-27 DIAGNOSIS — E1159 Type 2 diabetes mellitus with other circulatory complications: Secondary | ICD-10-CM

## 2020-08-27 DIAGNOSIS — L989 Disorder of the skin and subcutaneous tissue, unspecified: Secondary | ICD-10-CM | POA: Diagnosis not present

## 2020-08-27 DIAGNOSIS — E1165 Type 2 diabetes mellitus with hyperglycemia: Secondary | ICD-10-CM

## 2020-08-27 DIAGNOSIS — E1169 Type 2 diabetes mellitus with other specified complication: Secondary | ICD-10-CM | POA: Diagnosis not present

## 2020-08-27 DIAGNOSIS — I152 Hypertension secondary to endocrine disorders: Secondary | ICD-10-CM

## 2020-08-27 DIAGNOSIS — B3731 Acute candidiasis of vulva and vagina: Secondary | ICD-10-CM

## 2020-08-27 DIAGNOSIS — IMO0002 Reserved for concepts with insufficient information to code with codable children: Secondary | ICD-10-CM

## 2020-08-27 DIAGNOSIS — E1365 Other specified diabetes mellitus with hyperglycemia: Secondary | ICD-10-CM

## 2020-08-27 LAB — BAYER DCA HB A1C WAIVED: HB A1C (BAYER DCA - WAIVED): 10.4 % — ABNORMAL HIGH (ref <7.0)

## 2020-08-27 MED ORDER — FLUCONAZOLE 150 MG PO TABS: 150.0000 mg | ORAL_TABLET | ORAL | 0 refills | Status: DC | PRN

## 2020-08-27 NOTE — Patient Instructions (Signed)
Diabetes Mellitus and Nutrition, Adult When you have diabetes, or diabetes mellitus, it is very important to have healthy eating habits because your blood sugar (glucose) levels are greatly affected by what you eat and drink. Eating healthy foods in the right amounts, at about the same times every day, can help you:  Control your blood glucose.  Lower your risk of heart disease.  Improve your blood pressure.  Reach or maintain a healthy weight. What can affect my meal plan? Every person with diabetes is different, and each person has different needs for a meal plan. Your health care provider may recommend that you work with a dietitian to make a meal plan that is best for you. Your meal plan may vary depending on factors such as:  The calories you need.  The medicines you take.  Your weight.  Your blood glucose, blood pressure, and cholesterol levels.  Your activity level.  Other health conditions you have, such as heart or kidney disease. How do carbohydrates affect me? Carbohydrates, also called carbs, affect your blood glucose level more than any other type of food. Eating carbs naturally raises the amount of glucose in your blood. Carb counting is a method for keeping track of how many carbs you eat. Counting carbs is important to keep your blood glucose at a healthy level, especially if you use insulin or take certain oral diabetes medicines. It is important to know how many carbs you can safely have in each meal. This is different for every person. Your dietitian can help you calculate how many carbs you should have at each meal and for each snack. How does alcohol affect me? Alcohol can cause a sudden decrease in blood glucose (hypoglycemia), especially if you use insulin or take certain oral diabetes medicines. Hypoglycemia can be a life-threatening condition. Symptoms of hypoglycemia, such as sleepiness, dizziness, and confusion, are similar to symptoms of having too much  alcohol.  Do not drink alcohol if: ? Your health care provider tells you not to drink. ? You are pregnant, may be pregnant, or are planning to become pregnant.  If you drink alcohol: ? Do not drink on an empty stomach. ? Limit how much you use to:  0-1 drink a day for women.  0-2 drinks a day for men. ? Be aware of how much alcohol is in your drink. In the [place] one drink equals one 12 oz bottle of beer (355 mL), one 5 oz glass of wine (148 mL), or one 1 oz glass of hard liquor (44 mL). ? Keep yourself hydrated with water, diet soda, or unsweetened iced tea.  Keep in mind that regular soda, juice, and other mixers may contain a lot of sugar and must be counted as carbs. What are tips for following this plan? Reading food labels  Start by checking the serving size on the "Nutrition Facts" label of packaged foods and drinks. The amount of calories, carbs, fats, and other nutrients listed on the label is based on one serving of the item. Many items contain more than one serving per package.  Check the total grams (g) of carbs in one serving. You can calculate the number of servings of carbs in one serving by dividing the total carbs by 15. For example, if a food has 30 g of total carbs per serving, it would be equal to 2 servings of carbs.  Check the number of grams (g) of saturated fats and trans fats in one serving. Choose foods that have   a low amount or none of these fats.  Check the number of milligrams (mg) of salt (sodium) in one serving. Most people should limit total sodium intake to less than 2,300 mg per day.  Always check the nutrition information of foods labeled as "low-fat" or "nonfat." These foods may be higher in added sugar or refined carbs and should be avoided.  Talk to your dietitian to identify your daily goals for nutrients listed on the label. Shopping  Avoid buying canned, pre-made, or processed foods. These foods tend to be high in fat, sodium, and added  sugar.  Shop around the outside edge of the grocery store. This is where you will most often find fresh fruits and vegetables, bulk grains, fresh meats, and fresh dairy. Cooking  Use low-heat cooking methods, such as baking, instead of high-heat cooking methods like deep frying.  Cook using healthy oils, such as olive, canola, or sunflower oil.  Avoid cooking with butter, cream, or high-fat meats. Meal planning  Eat meals and snacks regularly, preferably at the same times every day. Avoid going long periods of time without eating.  Eat foods that are high in fiber, such as fresh fruits, vegetables, beans, and whole grains. Talk with your dietitian about how many servings of carbs you can eat at each meal.  Eat 4-6 oz (112-168 g) of lean protein each day, such as lean meat, chicken, fish, eggs, or tofu. One ounce (oz) of lean protein is equal to: ? 1 oz (28 g) of meat, chicken, or fish. ? 1 egg. ?  cup (62 g) of tofu.  Eat some foods each day that contain healthy fats, such as avocado, nuts, seeds, and fish.   What foods should I eat? Fruits Berries. Apples. Oranges. Peaches. Apricots. Plums. Grapes. Mango. Papaya. Pomegranate. Kiwi. Cherries. Vegetables Lettuce. Spinach. Leafy greens, including kale, chard, collard greens, and mustard greens. Beets. Cauliflower. Cabbage. Broccoli. Carrots. Green beans. Tomatoes. Peppers. Onions. Cucumbers. Brussels sprouts. Grains Whole grains, such as whole-wheat or whole-grain bread, crackers, tortillas, cereal, and pasta. Unsweetened oatmeal. Quinoa. Brown or wild rice. Meats and other proteins Seafood. Poultry without skin. Lean cuts of poultry and beef. Tofu. Nuts. Seeds. Dairy Low-fat or fat-free dairy products such as milk, yogurt, and cheese. The items listed above may not be a complete list of foods and beverages you can eat. Contact a dietitian for more information. What foods should I avoid? Fruits Fruits canned with  syrup. Vegetables Canned vegetables. Frozen vegetables with butter or cream sauce. Grains Refined white flour and flour products such as bread, pasta, snack foods, and cereals. Avoid all processed foods. Meats and other proteins Fatty cuts of meat. Poultry with skin. Breaded or fried meats. Processed meat. Avoid saturated fats. Dairy Full-fat yogurt, cheese, or milk. Beverages Sweetened drinks, such as soda or iced tea. The items listed above may not be a complete list of foods and beverages you should avoid. Contact a dietitian for more information. Questions to ask a health care provider  Do I need to meet with a diabetes educator?  Do I need to meet with a dietitian?  What number can I call if I have questions?  When are the best times to check my blood glucose? Where to find more information:  American Diabetes Association: diabetes.org  Academy of Nutrition and Dietetics: www.eatright.org  National Institute of Diabetes and Digestive and Kidney Diseases: www.niddk.nih.gov  Association of Diabetes Care and Education Specialists: www.diabeteseducator.org Summary  It is important to have healthy eating   habits because your blood sugar (glucose) levels are greatly affected by what you eat and drink.  A healthy meal plan will help you control your blood glucose and maintain a healthy lifestyle.  Your health care provider may recommend that you work with a dietitian to make a meal plan that is best for you.  Keep in mind that carbohydrates (carbs) and alcohol have immediate effects on your blood glucose levels. It is important to count carbs and to use alcohol carefully. This information is not intended to replace advice given to you by your health care provider. Make sure you discuss any questions you have with your health care provider. Document Revised: 04/18/2019 Document Reviewed: 04/18/2019 Elsevier Patient Education  2021 Elsevier Inc.  

## 2020-08-27 NOTE — Progress Notes (Signed)
Patient seen today for chronic follow up.

## 2020-08-27 NOTE — Progress Notes (Signed)
Subjective:    Patient ID: Kathryn Henson, female    DOB: Mar 27, 1923, 87 y.o.   MRN: [id number]  Chief Complaint  Patient presents with  . Medical Management of Chronic Issues  . Hypertension  . Diabetes  . Rash    On stomach   . Nevus    On face picking at     Pt presents to the office today for chronic follow up. She is followed by her Cardiologists  for cardiomyopathy and has a pacemaker in place.  She also  Has a skin lesion on her temple area that she picks all the time. Would like removed today.  Hypertension This is a chronic problem. The current episode started more than 1 year ago. The problem has been waxing and waning since onset. The problem is uncontrolled. Pertinent negatives include no blurred vision, malaise/fatigue, peripheral edema or shortness of breath. Risk factors for coronary artery disease include dyslipidemia, diabetes mellitus, obesity and sedentary lifestyle. The current treatment provides moderate improvement. There is no history of CVA or heart failure.  Diabetes She presents for her follow-up diabetic visit. She has type 2 diabetes mellitus. There are no hypoglycemic associated symptoms. Pertinent negatives for diabetes include no blurred vision and no foot paresthesias. Symptoms are stable. Diabetic complications include nephropathy. Pertinent negatives for diabetic complications include no CVA or peripheral neuropathy. Risk factors for coronary artery disease include dyslipidemia, diabetes mellitus, hypertension and sedentary lifestyle. She is following a generally healthy diet. Her overall blood glucose range is >200 mg/dl. An ACE inhibitor/angiotensin II receptor blocker is being taken. Eye exam is not current.  Rash This is a new problem. The current episode started more than 1 month ago. The problem has been waxing and waning since onset. Pertinent negatives include no shortness of breath.  Hyperlipidemia This is a chronic problem. The current episode  started more than 1 year ago. The problem is controlled. Recent lipid tests were reviewed and are normal. Pertinent negatives include no shortness of breath. Current antihyperlipidemic treatment includes statins. The current treatment provides moderate improvement of lipids. Risk factors for coronary artery disease include dyslipidemia, hypertension and a sedentary lifestyle.  Vaginal Itching The patient's primary symptoms include genital itching. This is a recurrent problem. The current episode started more than 1 month ago. The problem occurs intermittently. Associated symptoms include rash. She is not sexually active.      Review of Systems  Constitutional: Negative for malaise/fatigue.  Eyes: Negative for blurred vision.  Respiratory: Negative for shortness of breath.   Skin: Positive for rash.  All other systems reviewed and are negative.      Objective:   Physical Exam Vitals reviewed.  Constitutional:      General: She is not in acute distress.    Appearance: She is well-developed.  HENT:     Head: Normocephalic and atraumatic.     Right Ear: Tympanic membrane normal.     Left Ear: Tympanic membrane normal.  Eyes:     Pupils: Pupils are equal, round, and reactive to light.  Neck:     Thyroid: No thyromegaly.  Cardiovascular:     Rate and Rhythm: Normal rate and regular rhythm.     Heart sounds: Normal heart sounds. No murmur heard.   Pulmonary:     Effort: Pulmonary effort is normal. No respiratory distress.     Breath sounds: Normal breath sounds. No wheezing.  Abdominal:     General: Bowel sounds are normal. There is no distension.  Palpations: Abdomen is soft.     Tenderness: There is no abdominal tenderness.  Musculoskeletal:        General: No tenderness. Normal range of motion.     Cervical back: Normal range of motion and neck supple.  Skin:    General: Skin is warm and dry.     Findings: Rash present.          Comments: circular skin tone lesion on  right temple area  Neurological:     Mental Status: She is alert and oriented to person, place, and time.     Cranial Nerves: No cranial nerve deficit.     Deep Tendon Reflexes: Reflexes are normal and symmetric.  Psychiatric:        Behavior: Behavior normal.        Thought Content: Thought content normal.        Judgment: Judgment normal.    Cryotherapy used on skin lesion. Pt tolerated well.   BP 118/70   Pulse 77   Temp 97.8 F (36.6 C) (Temporal)   Ht _0  (1.549 m)   Wt 138 lb 12.8 oz (63 kg)   BMI 26.23 kg/m       Assessment & Plan:  Kathryn Henson comes in today with chief complaint of Medical Management of Chronic Issues, Hypertension, Diabetes, Rash (On stomach ), and Nevus (On face picking at )   Diagnosis and orders addressed:  1. Hypertension associated with diabetes ([place]) - CMP14+EGFR - CBC with Differential/Platelet  2. DM (diabetes mellitus), secondary uncontrolled ([place]) - Bayer DCA Hb A1c Waived - CMP14+EGFR - CBC with Differential/Platelet  3. Hyperlipidemia associated with type 2 diabetes mellitus ([place] - CMP14+EGFR - CBC with Differential/Platelet  4. Uncontrolled type 2 diabetes mellitus with hyperglycemia ([facility]) - CMP14+EGFR - CBC with Differential/Platelet  5. Vagina, candidiasis - CMP14+EGFR - CBC with Differential/Platelet - fluconazole (DIFLUCAN) 150 MG tablet; Take 1 tablet (150 mg total) by mouth every three (3) days as needed.  Dispense: 3 tablet; Refill: 0  6. Skin lesion of scalp Do not pick at lesion   Labs pending Health Maintenance reviewed Diet and exercise encouraged  Follow up plan: 3 months    Evelina Dun, FNP

## 2020-08-28 LAB — CBC WITH DIFFERENTIAL/PLATELET
Basophils Absolute: 0 10*3/uL (ref 0.0–0.2)
Basos: 0 %
EOS (ABSOLUTE): 0 10*3/uL (ref 0.0–0.4)
Eos: 0 %
Hematocrit: 36.2 % (ref 34.0–46.6)
Hemoglobin: 11.9 g/dL (ref 11.1–15.9)
Immature Grans (Abs): 0 10*3/uL (ref 0.0–0.1)
Immature Granulocytes: 0 %
Lymphocytes Absolute: 0.6 10*3/uL — ABNORMAL LOW (ref 0.7–3.1)
Lymphs: 8 %
MCH: 31.4 pg (ref 26.6–33.0)
MCHC: 32.9 g/dL (ref 31.5–35.7)
MCV: 96 fL (ref 79–97)
Monocytes Absolute: 0.4 10*3/uL (ref 0.1–0.9)
Monocytes: 5 %
Neutrophils Absolute: 6 10*3/uL (ref 1.4–7.0)
Neutrophils: 87 %
Platelets: 152 10*3/uL (ref 150–450)
RBC: 3.79 x10E6/uL (ref 3.77–5.28)
RDW: 13.6 % (ref 11.7–15.4)
WBC: 7 10*3/uL (ref 3.4–10.8)

## 2020-08-28 LAB — CMP14+EGFR
ALT: 15 IU/L (ref 0–32)
AST: 23 IU/L (ref 0–40)
Albumin/Globulin Ratio: 1.4 (ref 1.2–2.2)
Albumin: 3.8 g/dL (ref 3.5–4.6)
Alkaline Phosphatase: 92 IU/L (ref 44–121)
BUN/Creatinine Ratio: 21 (ref 12–28)
BUN: 24 mg/dL (ref 10–36)
Bilirubin Total: 0.6 mg/dL (ref 0.0–1.2)
CO2: 24 mmol/L (ref 20–29)
Calcium: 8.9 mg/dL (ref 8.7–10.3)
Chloride: 100 mmol/L (ref 96–106)
Creatinine, Ser: 1.16 mg/dL — ABNORMAL HIGH (ref 0.57–1.00)
Globulin, Total: 2.8 g/dL (ref 1.5–4.5)
Glucose: 268 mg/dL — ABNORMAL HIGH (ref 65–99)
Potassium: 4.2 mmol/L (ref 3.5–5.2)
Sodium: 138 mmol/L (ref 134–144)
Total Protein: 6.6 g/dL (ref 6.0–8.5)
eGFR: 43 mL/min/{1.73_m2} — ABNORMAL LOW (ref >59)

## 2020-11-24 ENCOUNTER — Other Ambulatory Visit: Payer: Self-pay | Admitting: Family

## 2020-11-24 DIAGNOSIS — E118 Type 2 diabetes mellitus with unspecified complications: Secondary | ICD-10-CM

## 2020-12-02 ENCOUNTER — Other Ambulatory Visit: Payer: Self-pay

## 2020-12-02 ENCOUNTER — Encounter: Payer: Self-pay | Admitting: Family

## 2020-12-02 ENCOUNTER — Ambulatory Visit: Payer: Medicare PPO | Admitting: Family

## 2020-12-02 VITALS — BP 130/77 | HR 72 | Temp 97.3°F | Ht 60.0 in | Wt 137.6 lb

## 2020-12-02 DIAGNOSIS — M1990 Unspecified osteoarthritis, unspecified site: Secondary | ICD-10-CM

## 2020-12-02 DIAGNOSIS — E785 Hyperlipidemia, unspecified: Secondary | ICD-10-CM | POA: Diagnosis not present

## 2020-12-02 DIAGNOSIS — IMO0002 Reserved for concepts with insufficient information to code with codable children: Secondary | ICD-10-CM

## 2020-12-02 DIAGNOSIS — E1169 Type 2 diabetes mellitus with other specified complication: Secondary | ICD-10-CM

## 2020-12-02 DIAGNOSIS — E1365 Other specified diabetes mellitus with hyperglycemia: Secondary | ICD-10-CM

## 2020-12-02 DIAGNOSIS — Z23 Encounter for immunization: Secondary | ICD-10-CM

## 2020-12-02 DIAGNOSIS — E1159 Type 2 diabetes mellitus with other circulatory complications: Secondary | ICD-10-CM

## 2020-12-02 DIAGNOSIS — I152 Hypertension secondary to endocrine disorders: Secondary | ICD-10-CM

## 2020-12-02 DIAGNOSIS — E1165 Type 2 diabetes mellitus with hyperglycemia: Secondary | ICD-10-CM

## 2020-12-02 LAB — BAYER DCA HB A1C WAIVED: HB A1C (BAYER DCA - WAIVED): 10.9 % — ABNORMAL HIGH (ref <7.0)

## 2020-12-02 NOTE — Addendum Note (Signed)
Addended by: Ladean Raya on: 12/02/2020 03:44 PM   Modules accepted: Orders

## 2020-12-02 NOTE — Patient Instructions (Signed)
Diabetes Mellitus and Nutrition, Adult When you have diabetes, or diabetes mellitus, it is very important to have healthy eating habits because your blood sugar (glucose) levels are greatly affected by what you eat and drink. Eating healthy foods in the right amounts, at about the same times every day, can help you:  Control your blood glucose.  Lower your risk of heart disease.  Improve your blood pressure.  Reach or maintain a healthy weight. What can affect my meal plan? Every person with diabetes is different, and each person has different needs for a meal plan. Your health care provider may recommend that you work with a dietitian to make a meal plan that is best for you. Your meal plan may vary depending on factors such as:  The calories you need.  The medicines you take.  Your weight.  Your blood glucose, blood pressure, and cholesterol levels.  Your activity level.  Other health conditions you have, such as heart or kidney disease. How do carbohydrates affect me? Carbohydrates, also called carbs, affect your blood glucose level more than any other type of food. Eating carbs naturally raises the amount of glucose in your blood. Carb counting is a method for keeping track of how many carbs you eat. Counting carbs is important to keep your blood glucose at a healthy level, especially if you use insulin or take certain oral diabetes medicines. It is important to know how many carbs you can safely have in each meal. This is different for every person. Your dietitian can help you calculate how many carbs you should have at each meal and for each snack. How does alcohol affect me? Alcohol can cause a sudden decrease in blood glucose (hypoglycemia), especially if you use insulin or take certain oral diabetes medicines. Hypoglycemia can be a life-threatening condition. Symptoms of hypoglycemia, such as sleepiness, dizziness, and confusion, are similar to symptoms of having too much  alcohol.  Do not drink alcohol if: ? Your health care provider tells you not to drink. ? You are pregnant, may be pregnant, or are planning to become pregnant.  If you drink alcohol: ? Do not drink on an empty stomach. ? Limit how much you use to:  0-1 drink a day for women.  0-2 drinks a day for men. ? Be aware of how much alcohol is in your drink. In the [place] one drink equals one 12 oz bottle of beer (355 mL), one 5 oz glass of wine (148 mL), or one 1 oz glass of hard liquor (44 mL). ? Keep yourself hydrated with water, diet soda, or unsweetened iced tea.  Keep in mind that regular soda, juice, and other mixers may contain a lot of sugar and must be counted as carbs. What are tips for following this plan? Reading food labels  Start by checking the serving size on the "Nutrition Facts" label of packaged foods and drinks. The amount of calories, carbs, fats, and other nutrients listed on the label is based on one serving of the item. Many items contain more than one serving per package.  Check the total grams (g) of carbs in one serving. You can calculate the number of servings of carbs in one serving by dividing the total carbs by 15. For example, if a food has 30 g of total carbs per serving, it would be equal to 2 servings of carbs.  Check the number of grams (g) of saturated fats and trans fats in one serving. Choose foods that have   a low amount or none of these fats.  Check the number of milligrams (mg) of salt (sodium) in one serving. Most people should limit total sodium intake to less than 2,300 mg per day.  Always check the nutrition information of foods labeled as "low-fat" or "nonfat." These foods may be higher in added sugar or refined carbs and should be avoided.  Talk to your dietitian to identify your daily goals for nutrients listed on the label. Shopping  Avoid buying canned, pre-made, or processed foods. These foods tend to be high in fat, sodium, and added  sugar.  Shop around the outside edge of the grocery store. This is where you will most often find fresh fruits and vegetables, bulk grains, fresh meats, and fresh dairy. Cooking  Use low-heat cooking methods, such as baking, instead of high-heat cooking methods like deep frying.  Cook using healthy oils, such as olive, canola, or sunflower oil.  Avoid cooking with butter, cream, or high-fat meats. Meal planning  Eat meals and snacks regularly, preferably at the same times every day. Avoid going long periods of time without eating.  Eat foods that are high in fiber, such as fresh fruits, vegetables, beans, and whole grains. Talk with your dietitian about how many servings of carbs you can eat at each meal.  Eat 4-6 oz (112-168 g) of lean protein each day, such as lean meat, chicken, fish, eggs, or tofu. One ounce (oz) of lean protein is equal to: ? 1 oz (28 g) of meat, chicken, or fish. ? 1 egg. ?  cup (62 g) of tofu.  Eat some foods each day that contain healthy fats, such as avocado, nuts, seeds, and fish.   What foods should I eat? Fruits Berries. Apples. Oranges. Peaches. Apricots. Plums. Grapes. Mango. Papaya. Pomegranate. Kiwi. Cherries. Vegetables Lettuce. Spinach. Leafy greens, including kale, chard, collard greens, and mustard greens. Beets. Cauliflower. Cabbage. Broccoli. Carrots. Green beans. Tomatoes. Peppers. Onions. Cucumbers. Brussels sprouts. Grains Whole grains, such as whole-wheat or whole-grain bread, crackers, tortillas, cereal, and pasta. Unsweetened oatmeal. Quinoa. Brown or wild rice. Meats and other proteins Seafood. Poultry without skin. Lean cuts of poultry and beef. Tofu. Nuts. Seeds. Dairy Low-fat or fat-free dairy products such as milk, yogurt, and cheese. The items listed above may not be a complete list of foods and beverages you can eat. Contact a dietitian for more information. What foods should I avoid? Fruits Fruits canned with  syrup. Vegetables Canned vegetables. Frozen vegetables with butter or cream sauce. Grains Refined white flour and flour products such as bread, pasta, snack foods, and cereals. Avoid all processed foods. Meats and other proteins Fatty cuts of meat. Poultry with skin. Breaded or fried meats. Processed meat. Avoid saturated fats. Dairy Full-fat yogurt, cheese, or milk. Beverages Sweetened drinks, such as soda or iced tea. The items listed above may not be a complete list of foods and beverages you should avoid. Contact a dietitian for more information. Questions to ask a health care provider  Do I need to meet with a diabetes educator?  Do I need to meet with a dietitian?  What number can I call if I have questions?  When are the best times to check my blood glucose? Where to find more information:  American Diabetes Association: diabetes.org  Academy of Nutrition and Dietetics: www.eatright.org  National Institute of Diabetes and Digestive and Kidney Diseases: www.niddk.nih.gov  Association of Diabetes Care and Education Specialists: www.diabeteseducator.org Summary  It is important to have healthy eating   habits because your blood sugar (glucose) levels are greatly affected by what you eat and drink.  A healthy meal plan will help you control your blood glucose and maintain a healthy lifestyle.  Your health care provider may recommend that you work with a dietitian to make a meal plan that is best for you.  Keep in mind that carbohydrates (carbs) and alcohol have immediate effects on your blood glucose levels. It is important to count carbs and to use alcohol carefully. This information is not intended to replace advice given to you by your health care provider. Make sure you discuss any questions you have with your health care provider. Document Revised: 04/18/2019 Document Reviewed: 04/18/2019 Elsevier Patient Education  2021 Elsevier Inc.  

## 2020-12-02 NOTE — Progress Notes (Signed)
Subjective:    Patient ID: Kathryn Henson, female    DOB: [birth year], 87 y.o.   MRN: [id number]  Chief Complaint  Patient presents with   Medical Management of Chronic Issues   Pt presents to the office today for chronic follow up. She is followed by her Cardiologists  for cardiomyopathy and has a pacemaker in place. Hypertension This is a chronic problem. The current episode started more than 1 year ago. The problem has been resolved since onset. The problem is controlled. Pertinent negatives include no blurred vision, malaise/fatigue, peripheral edema or shortness of breath. Risk factors for coronary artery disease include dyslipidemia and sedentary lifestyle. The current treatment provides moderate improvement. There is no history of CVA or heart failure.  Diabetes She presents for her follow-up diabetic visit. She has type 2 diabetes mellitus. There are no hypoglycemic associated symptoms. Pertinent negatives for diabetes include no blurred vision and no foot paresthesias. Pertinent negatives for diabetic complications include no CVA. Risk factors for coronary artery disease include dyslipidemia. She is following a generally unhealthy diet. Her overall blood glucose range is >200 mg/dl. Eye exam is not current.  Hyperlipidemia This is a chronic problem. The current episode started more than 1 year ago. The problem is controlled. Recent lipid tests were reviewed and are normal. Pertinent negatives include no shortness of breath. Current antihyperlipidemic treatment includes statins. The current treatment provides moderate improvement of lipids. Risk factors for coronary artery disease include dyslipidemia, diabetes mellitus, hypertension and post-menopausal.  Arthritis Presents for follow-up visit. She complains of pain and stiffness. The symptoms have been stable. Affected locations include the right knee, left knee, right MCP and left MCP. Her pain is at a severity of 1/10.     Review of  Systems  Constitutional:  Negative for malaise/fatigue.  Eyes:  Negative for blurred vision.  Respiratory:  Negative for shortness of breath.   Musculoskeletal:  Positive for arthritis and stiffness.  All other systems reviewed and are negative.     Objective:   Physical Exam Vitals reviewed.  Constitutional:      General: She is not in acute distress.    Appearance: She is well-developed.  HENT:     Head: Normocephalic and atraumatic.     Right Ear: Tympanic membrane normal.     Left Ear: Tympanic membrane normal.  Eyes:     Pupils: Pupils are equal, round, and reactive to light.  Neck:     Thyroid: No thyromegaly.  Cardiovascular:     Rate and Rhythm: Normal rate and regular rhythm.     Heart sounds: Normal heart sounds. No murmur heard. Pulmonary:     Effort: Pulmonary effort is normal. No respiratory distress.     Breath sounds: Normal breath sounds. No wheezing.  Abdominal:     General: Bowel sounds are normal. There is no distension.     Palpations: Abdomen is soft.     Tenderness: There is no abdominal tenderness.  Musculoskeletal:        General: No tenderness. Normal range of motion.     Cervical back: Normal range of motion and neck supple.  Skin:    General: Skin is warm and dry.  Neurological:     Mental Status: She is alert and oriented to person, place, and time.     Cranial Nerves: No cranial nerve deficit.     Deep Tendon Reflexes: Reflexes are normal and symmetric.  Psychiatric:        Behavior: Behavior  normal.        Thought Content: Thought content normal.        Judgment: Judgment normal.      BP 130/77   Pulse 72   Temp (!) 97.3 F (36.3 C) (Temporal)   Ht 5' (1.524 m)   Wt 137 lb 9.6 oz (62.4 kg)   SpO2 98%   BMI 26.87 kg/m      Assessment & Plan:  SHRIYA AKER comes in today with chief complaint of Medical Management of Chronic Issues   Diagnosis and orders addressed:  1. Hypertension associated with diabetes ([place]) -  CMP14+EGFR - CBC with Differential/Platelet  2. Uncontrolled type 2 diabetes mellitus with hyperglycemia ([facility]) - Bayer DCA Hb A1c Waived - CMP14+EGFR - CBC with Differential/Platelet  3. Hyperlipidemia associated with type 2 diabetes mellitus ([facility]) - CMP14+EGFR - CBC with Differential/Platelet  4. DM (diabetes mellitus), secondary uncontrolled ([place]) - Bayer DCA Hb A1c Waived - CMP14+EGFR - CBC with Differential/Platelet  5. Arthritis - CMP14+EGFR - CBC with Differential/Platelet   Labs pending Health Maintenance reviewed Diet and exercise encouraged  Follow up plan: 3 months    Evelina Dun, FNP

## 2020-12-03 LAB — CBC WITH DIFFERENTIAL/PLATELET
Basophils Absolute: 0 10*3/uL (ref 0.0–0.2)
Basos: 1 %
EOS (ABSOLUTE): 0.1 10*3/uL (ref 0.0–0.4)
Eos: 2 %
Hematocrit: 37.1 % (ref 34.0–46.6)
Hemoglobin: 12.4 g/dL (ref 11.1–15.9)
Immature Grans (Abs): 0 10*3/uL (ref 0.0–0.1)
Immature Granulocytes: 0 %
Lymphocytes Absolute: 1.8 10*3/uL (ref 0.7–3.1)
Lymphs: 37 %
MCH: 31.7 pg (ref 26.6–33.0)
MCHC: 33.4 g/dL (ref 31.5–35.7)
MCV: 95 fL (ref 79–97)
Monocytes Absolute: 0.4 10*3/uL (ref 0.1–0.9)
Monocytes: 8 %
Neutrophils Absolute: 2.5 10*3/uL (ref 1.4–7.0)
Neutrophils: 52 %
Platelets: 174 10*3/uL (ref 150–450)
RBC: 3.91 x10E6/uL (ref 3.77–5.28)
RDW: 13.4 % (ref 11.7–15.4)
WBC: 4.8 10*3/uL (ref 3.4–10.8)

## 2020-12-03 LAB — CMP14+EGFR
ALT: 15 IU/L (ref 0–32)
AST: 11 IU/L (ref 0–40)
Albumin/Globulin Ratio: 1.4 (ref 1.2–2.2)
Albumin: 3.8 g/dL (ref 3.5–4.6)
Alkaline Phosphatase: 110 IU/L (ref 44–121)
BUN/Creatinine Ratio: 22 (ref 12–28)
BUN: 23 mg/dL (ref 10–36)
Bilirubin Total: 0.2 mg/dL (ref 0.0–1.2)
CO2: 23 mmol/L (ref 20–29)
Calcium: 9.5 mg/dL (ref 8.7–10.3)
Chloride: 101 mmol/L (ref 96–106)
Creatinine, Ser: 1.03 mg/dL — ABNORMAL HIGH (ref 0.57–1.00)
Globulin, Total: 2.8 g/dL (ref 1.5–4.5)
Glucose: 339 mg/dL — ABNORMAL HIGH (ref 65–99)
Potassium: 4.9 mmol/L (ref 3.5–5.2)
Sodium: 137 mmol/L (ref 134–144)
Total Protein: 6.6 g/dL (ref 6.0–8.5)
eGFR: 50 mL/min/{1.73_m2} — ABNORMAL LOW (ref >59)

## 2020-12-17 ENCOUNTER — Other Ambulatory Visit: Payer: Self-pay | Admitting: Family

## 2021-01-06 DIAGNOSIS — Z833 Family history of diabetes mellitus: Secondary | ICD-10-CM | POA: Diagnosis not present

## 2021-01-06 DIAGNOSIS — Z8249 Family history of ischemic heart disease and other diseases of the circulatory system: Secondary | ICD-10-CM | POA: Diagnosis not present

## 2021-01-06 DIAGNOSIS — Z823 Family history of stroke: Secondary | ICD-10-CM | POA: Diagnosis not present

## 2021-01-06 DIAGNOSIS — Z7984 Long term (current) use of oral hypoglycemic drugs: Secondary | ICD-10-CM | POA: Diagnosis not present

## 2021-01-06 DIAGNOSIS — E1159 Type 2 diabetes mellitus with other circulatory complications: Secondary | ICD-10-CM | POA: Diagnosis not present

## 2021-01-06 DIAGNOSIS — Z7982 Long term (current) use of aspirin: Secondary | ICD-10-CM | POA: Diagnosis not present

## 2021-01-06 DIAGNOSIS — E785 Hyperlipidemia, unspecified: Secondary | ICD-10-CM | POA: Diagnosis not present

## 2021-01-06 DIAGNOSIS — I10 Essential (primary) hypertension: Secondary | ICD-10-CM | POA: Diagnosis not present

## 2021-01-06 DIAGNOSIS — M1990 Unspecified osteoarthritis, unspecified site: Secondary | ICD-10-CM | POA: Diagnosis not present

## 2021-01-13 ENCOUNTER — Other Ambulatory Visit: Payer: Self-pay | Admitting: Family

## 2021-01-13 DIAGNOSIS — I152 Hypertension secondary to endocrine disorders: Secondary | ICD-10-CM

## 2021-02-17 ENCOUNTER — Other Ambulatory Visit: Payer: Self-pay | Admitting: Family

## 2021-02-17 DIAGNOSIS — E785 Hyperlipidemia, unspecified: Secondary | ICD-10-CM

## 2021-02-17 DIAGNOSIS — E1169 Type 2 diabetes mellitus with other specified complication: Secondary | ICD-10-CM

## 2021-03-04 ENCOUNTER — Ambulatory Visit: Payer: Medicare PPO | Admitting: Family

## 2021-03-04 ENCOUNTER — Encounter: Payer: Self-pay | Admitting: Family

## 2021-03-04 ENCOUNTER — Other Ambulatory Visit: Payer: Self-pay

## 2021-03-04 VITALS — BP 108/73 | HR 83 | Temp 97.6°F | Resp 20 | Ht 60.0 in | Wt 138.0 lb

## 2021-03-04 DIAGNOSIS — E1159 Type 2 diabetes mellitus with other circulatory complications: Secondary | ICD-10-CM | POA: Diagnosis not present

## 2021-03-04 DIAGNOSIS — E1165 Type 2 diabetes mellitus with hyperglycemia: Secondary | ICD-10-CM | POA: Diagnosis not present

## 2021-03-04 DIAGNOSIS — M1990 Unspecified osteoarthritis, unspecified site: Secondary | ICD-10-CM | POA: Diagnosis not present

## 2021-03-04 DIAGNOSIS — E785 Hyperlipidemia, unspecified: Secondary | ICD-10-CM

## 2021-03-04 DIAGNOSIS — I152 Hypertension secondary to endocrine disorders: Secondary | ICD-10-CM

## 2021-03-04 DIAGNOSIS — Z950 Presence of cardiac pacemaker: Secondary | ICD-10-CM

## 2021-03-04 DIAGNOSIS — E118 Type 2 diabetes mellitus with unspecified complications: Secondary | ICD-10-CM

## 2021-03-04 DIAGNOSIS — E1169 Type 2 diabetes mellitus with other specified complication: Secondary | ICD-10-CM | POA: Diagnosis not present

## 2021-03-04 LAB — BAYER DCA HB A1C WAIVED: HB A1C (BAYER DCA - WAIVED): 11.3 % — ABNORMAL HIGH (ref 4.8–5.6)

## 2021-03-04 MED ORDER — METOPROLOL TARTRATE 25 MG PO TABS: 25.0000 mg | ORAL_TABLET | Freq: Two times a day (BID) | ORAL | 1 refills | Status: DC

## 2021-03-04 MED ORDER — LISINOPRIL 10 MG PO TABS: 10.0000 mg | ORAL_TABLET | Freq: Every morning | ORAL | 1 refills | Status: DC

## 2021-03-04 MED ORDER — NITROGLYCERIN 0.4 MG SL SUBL: 0.4000 mg | SUBLINGUAL_TABLET | SUBLINGUAL | 2 refills | Status: AC | PRN

## 2021-03-04 MED ORDER — GLIMEPIRIDE 2 MG PO TABS: ORAL_TABLET | ORAL | 0 refills | Status: DC

## 2021-03-04 MED ORDER — ROSUVASTATIN CALCIUM 5 MG PO TABS: 5.0000 mg | ORAL_TABLET | Freq: Every morning | ORAL | 1 refills | Status: DC

## 2021-03-04 NOTE — Progress Notes (Signed)
Subjective:    Patient ID: Kathryn Henson, female    DOB: [birth year], 87 y.o.   MRN: [id number]  Chief Complaint  Patient presents with  . Medical Management of Chronic Issues   Pt presents to the office today for chronic follow up. She is followed by her Cardiologists  for cardiomyopathy and has a pacemaker in place. Diabetes She presents for her follow-up diabetic visit. She has type 2 diabetes mellitus. There are no hypoglycemic associated symptoms. Pertinent negatives for diabetes include no blurred vision and no foot paresthesias. Symptoms are stable. Diabetic complications include heart disease and nephropathy. Risk factors for coronary artery disease include dyslipidemia, diabetes mellitus, hypertension, sedentary lifestyle and post-menopausal. She is following a generally healthy diet. Her overall blood glucose range is >200 mg/dl.  Hyperlipidemia This is a chronic problem. The current episode started more than 1 year ago. Recent lipid tests were reviewed and are normal. Current antihyperlipidemic treatment includes statins. Risk factors for coronary artery disease include dyslipidemia, diabetes mellitus, hypertension, a sedentary lifestyle and post-menopausal.  Arthritis Presents for follow-up visit. She complains of pain and stiffness. The symptoms have been stable. Affected locations include the left MCP, right MCP, left knee and right knee (back). Her pain is at a severity of 2/10.     Review of Systems  Eyes:  Negative for blurred vision.  Musculoskeletal:  Positive for arthritis and stiffness.  All other systems reviewed and are negative.     Objective:   Physical Exam Vitals reviewed.  Constitutional:      General: She is not in acute distress.    Appearance: She is well-developed.  HENT:     Head: Normocephalic and atraumatic.     Right Ear: Tympanic membrane normal.     Left Ear: Tympanic membrane normal.  Eyes:     Pupils: Pupils are equal, round, and reactive  to light.  Neck:     Thyroid: No thyromegaly.  Cardiovascular:     Rate and Rhythm: Normal rate and regular rhythm.     Heart sounds: Normal heart sounds. No murmur heard. Pulmonary:     Effort: Pulmonary effort is normal. No respiratory distress.     Breath sounds: Normal breath sounds. No wheezing.  Abdominal:     General: Bowel sounds are normal. There is no distension.     Palpations: Abdomen is soft.     Tenderness: There is no abdominal tenderness.  Musculoskeletal:        General: No tenderness. Normal range of motion.     Cervical back: Normal range of motion and neck supple.  Skin:    General: Skin is warm and dry.  Neurological:     Mental Status: She is alert and oriented to person, place, and time.     Cranial Nerves: No cranial nerve deficit.     Deep Tendon Reflexes: Reflexes are normal and symmetric.  Psychiatric:        Behavior: Behavior normal.        Thought Content: Thought content normal.        Judgment: Judgment normal.         BP 108/73   Pulse 83   Temp 97.6 F (36.4 C) (Temporal)   Resp 20   Ht 5' (1.524 m)   Wt 138 lb (62.6 kg)   SpO2 98%   BMI 26.95 kg/m   Assessment & Plan:  Kathryn Henson comes in today with chief complaint of Medical Management of Chronic Issues  Diagnosis and orders addressed:  1. Hypertension associated with diabetes ([place]) - metoprolol tartrate (LOPRESSOR) 25 MG tablet; Take 1 tablet (25 mg total) by mouth 2 (two) times daily.  Dispense: 180 tablet; Refill: 1 - CMP14+EGFR  2. Uncontrolled type 2 diabetes mellitus with hyperglycemia ([facility]) - Bayer DCA Hb A1c Waived - CMP14+EGFR  3. Hyperlipidemia associated with type 2 diabetes mellitus ([facility]) - rosuvastatin (CRESTOR) 5 MG tablet; Take 1 tablet (5 mg total) by mouth every morning.  Dispense: 90 tablet; Refill: 1 - CMP14+EGFR  4. Arthritis - CMP14+EGFR  5. Pacemaker - CMP14+EGFR  6. Type 2 diabetes mellitus with complication, without long-term current  use of insulin ([facility]) - glimepiride (AMARYL) 2 MG tablet; TAKE 1 TABLET BY MOUTH TWICE DAILY WITH MEALS . APPOINTMENT REQUIRED FOR FUTURE REFILLS  Dispense: 180 tablet; Refill: 0 - CMP14+EGFR   Labs pending Health Maintenance reviewed Diet and exercise encouraged  Follow up plan: 4-6 months   Evelina Dun, FNP

## 2021-03-04 NOTE — Patient Instructions (Signed)
Diabetes Mellitus and Nutrition, Adult When you have diabetes, or diabetes mellitus, it is very important to have healthy eating habits because your blood sugar (glucose) levels are greatly affected by what you eat and drink. Eating healthy foods in the right amounts, at about the same times every day, can help you:  Control your blood glucose.  Lower your risk of heart disease.  Improve your blood pressure.  Reach or maintain a healthy weight. What can affect my meal plan? Every person with diabetes is different, and each person has different needs for a meal plan. Your health care provider may recommend that you work with a dietitian to make a meal plan that is best for you. Your meal plan may vary depending on factors such as:  The calories you need.  The medicines you take.  Your weight.  Your blood glucose, blood pressure, and cholesterol levels.  Your activity level.  Other health conditions you have, such as heart or kidney disease. How do carbohydrates affect me? Carbohydrates, also called carbs, affect your blood glucose level more than any other type of food. Eating carbs naturally raises the amount of glucose in your blood. Carb counting is a method for keeping track of how many carbs you eat. Counting carbs is important to keep your blood glucose at a healthy level, especially if you use insulin or take certain oral diabetes medicines. It is important to know how many carbs you can safely have in each meal. This is different for every person. Your dietitian can help you calculate how many carbs you should have at each meal and for each snack. How does alcohol affect me? Alcohol can cause a sudden decrease in blood glucose (hypoglycemia), especially if you use insulin or take certain oral diabetes medicines. Hypoglycemia can be a life-threatening condition. Symptoms of hypoglycemia, such as sleepiness, dizziness, and confusion, are similar to symptoms of having too much  alcohol.  Do not drink alcohol if: ? Your health care provider tells you not to drink. ? You are pregnant, may be pregnant, or are planning to become pregnant.  If you drink alcohol: ? Do not drink on an empty stomach. ? Limit how much you use to:  0-1 drink a day for women.  0-2 drinks a day for men. ? Be aware of how much alcohol is in your drink. In the [place] one drink equals one 12 oz bottle of beer (355 mL), one 5 oz glass of wine (148 mL), or one 1 oz glass of hard liquor (44 mL). ? Keep yourself hydrated with water, diet soda, or unsweetened iced tea.  Keep in mind that regular soda, juice, and other mixers may contain a lot of sugar and must be counted as carbs. What are tips for following this plan? Reading food labels  Start by checking the serving size on the "Nutrition Facts" label of packaged foods and drinks. The amount of calories, carbs, fats, and other nutrients listed on the label is based on one serving of the item. Many items contain more than one serving per package.  Check the total grams (g) of carbs in one serving. You can calculate the number of servings of carbs in one serving by dividing the total carbs by 15. For example, if a food has 30 g of total carbs per serving, it would be equal to 2 servings of carbs.  Check the number of grams (g) of saturated fats and trans fats in one serving. Choose foods that have   a low amount or none of these fats.  Check the number of milligrams (mg) of salt (sodium) in one serving. Most people should limit total sodium intake to less than 2,300 mg per day.  Always check the nutrition information of foods labeled as "low-fat" or "nonfat." These foods may be higher in added sugar or refined carbs and should be avoided.  Talk to your dietitian to identify your daily goals for nutrients listed on the label. Shopping  Avoid buying canned, pre-made, or processed foods. These foods tend to be high in fat, sodium, and added  sugar.  Shop around the outside edge of the grocery store. This is where you will most often find fresh fruits and vegetables, bulk grains, fresh meats, and fresh dairy. Cooking  Use low-heat cooking methods, such as baking, instead of high-heat cooking methods like deep frying.  Cook using healthy oils, such as olive, canola, or sunflower oil.  Avoid cooking with butter, cream, or high-fat meats. Meal planning  Eat meals and snacks regularly, preferably at the same times every day. Avoid going long periods of time without eating.  Eat foods that are high in fiber, such as fresh fruits, vegetables, beans, and whole grains. Talk with your dietitian about how many servings of carbs you can eat at each meal.  Eat 4-6 oz (112-168 g) of lean protein each day, such as lean meat, chicken, fish, eggs, or tofu. One ounce (oz) of lean protein is equal to: ? 1 oz (28 g) of meat, chicken, or fish. ? 1 egg. ?  cup (62 g) of tofu.  Eat some foods each day that contain healthy fats, such as avocado, nuts, seeds, and fish.   What foods should I eat? Fruits Berries. Apples. Oranges. Peaches. Apricots. Plums. Grapes. Mango. Papaya. Pomegranate. Kiwi. Cherries. Vegetables Lettuce. Spinach. Leafy greens, including kale, chard, collard greens, and mustard greens. Beets. Cauliflower. Cabbage. Broccoli. Carrots. Green beans. Tomatoes. Peppers. Onions. Cucumbers. Brussels sprouts. Grains Whole grains, such as whole-wheat or whole-grain bread, crackers, tortillas, cereal, and pasta. Unsweetened oatmeal. Quinoa. Brown or wild rice. Meats and other proteins Seafood. Poultry without skin. Lean cuts of poultry and beef. Tofu. Nuts. Seeds. Dairy Low-fat or fat-free dairy products such as milk, yogurt, and cheese. The items listed above may not be a complete list of foods and beverages you can eat. Contact a dietitian for more information. What foods should I avoid? Fruits Fruits canned with  syrup. Vegetables Canned vegetables. Frozen vegetables with butter or cream sauce. Grains Refined white flour and flour products such as bread, pasta, snack foods, and cereals. Avoid all processed foods. Meats and other proteins Fatty cuts of meat. Poultry with skin. Breaded or fried meats. Processed meat. Avoid saturated fats. Dairy Full-fat yogurt, cheese, or milk. Beverages Sweetened drinks, such as soda or iced tea. The items listed above may not be a complete list of foods and beverages you should avoid. Contact a dietitian for more information. Questions to ask a health care provider  Do I need to meet with a diabetes educator?  Do I need to meet with a dietitian?  What number can I call if I have questions?  When are the best times to check my blood glucose? Where to find more information:  American Diabetes Association: diabetes.org  Academy of Nutrition and Dietetics: www.eatright.org  National Institute of Diabetes and Digestive and Kidney Diseases: www.niddk.nih.gov  Association of Diabetes Care and Education Specialists: www.diabeteseducator.org Summary  It is important to have healthy eating   habits because your blood sugar (glucose) levels are greatly affected by what you eat and drink.  A healthy meal plan will help you control your blood glucose and maintain a healthy lifestyle.  Your health care provider may recommend that you work with a dietitian to make a meal plan that is best for you.  Keep in mind that carbohydrates (carbs) and alcohol have immediate effects on your blood glucose levels. It is important to count carbs and to use alcohol carefully. This information is not intended to replace advice given to you by your health care provider. Make sure you discuss any questions you have with your health care provider. Document Revised: 04/18/2019 Document Reviewed: 04/18/2019 Elsevier Patient Education  2021 Elsevier Inc.  

## 2021-03-05 LAB — CMP14+EGFR
ALT: 10 IU/L (ref 0–32)
AST: 13 IU/L (ref 0–40)
Albumin/Globulin Ratio: 1.5 (ref 1.2–2.2)
Albumin: 4 g/dL (ref 3.5–4.6)
Alkaline Phosphatase: 100 IU/L (ref 44–121)
BUN/Creatinine Ratio: 21 (ref 12–28)
BUN: 23 mg/dL (ref 10–36)
Bilirubin Total: 0.4 mg/dL (ref 0.0–1.2)
CO2: 25 mmol/L (ref 20–29)
Calcium: 9.2 mg/dL (ref 8.7–10.3)
Chloride: 104 mmol/L (ref 96–106)
Creatinine, Ser: 1.12 mg/dL — ABNORMAL HIGH (ref 0.57–1.00)
Globulin, Total: 2.7 g/dL (ref 1.5–4.5)
Glucose: 258 mg/dL — ABNORMAL HIGH (ref 70–99)
Potassium: 4.7 mmol/L (ref 3.5–5.2)
Sodium: 142 mmol/L (ref 134–144)
Total Protein: 6.7 g/dL (ref 6.0–8.5)
eGFR: 45 mL/min/{1.73_m2} — ABNORMAL LOW (ref >59)

## 2021-03-09 IMAGING — CT CT HEAD W/O CM
4 series · 15 of 47 positions shown, 17 images · non-contrast
Comparison: None.

CLINICAL DATA: Syncope and fall.

EXAM:
CT HEAD WITHOUT CONTRAST
CT CERVICAL SPINE WITHOUT CONTRAST
TECHNIQUE: Multidetector CT imaging of the head and cervical spine was
performed following the standard protocol without intravenous
contrast. Multiplanar CT image reconstructions of the cervical spine
were also generated.

[Series 2: head w o · axial · 0.43mm/px · z∈[+1377,+1492]mm · 7 of 31 slices shown, 9 images]
[im 4/31  brain]
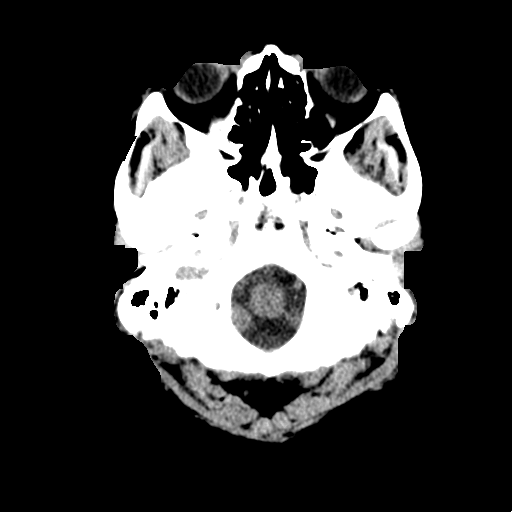
[im 4/31  bone]
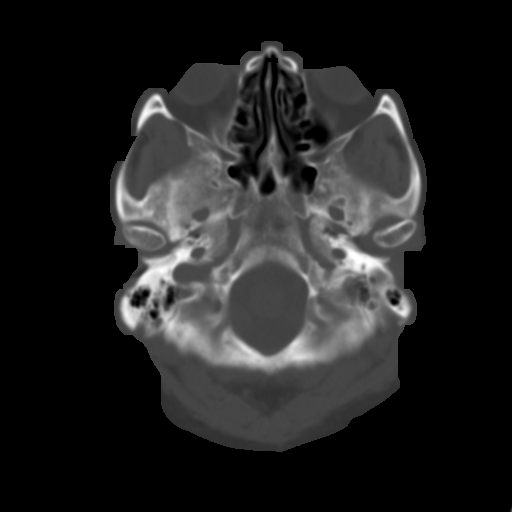
[im 8/31  brain]
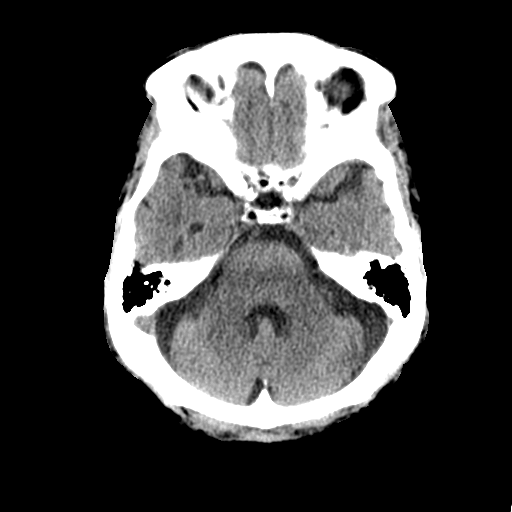
[im 12/31  brain]
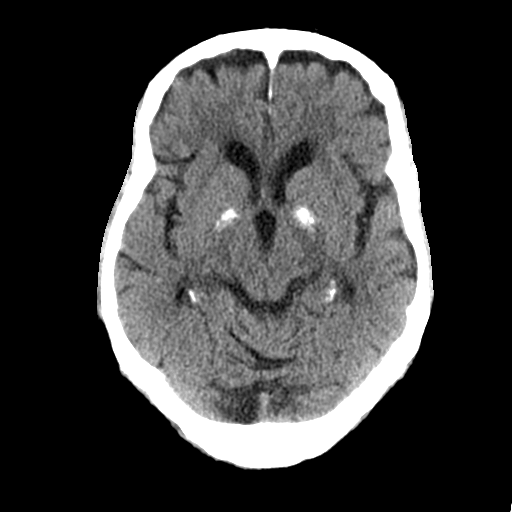
[im 16/31  brain]
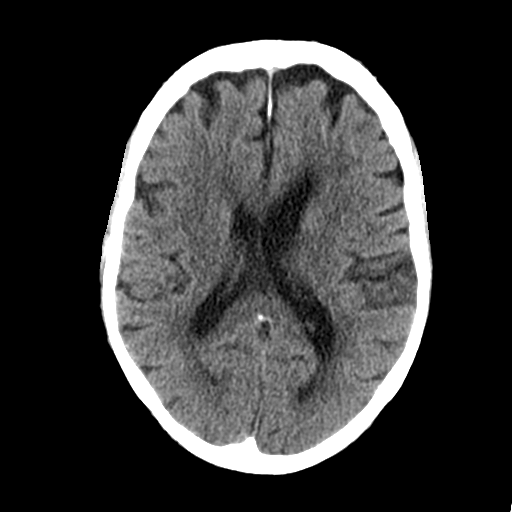
[im 19/31  brain]
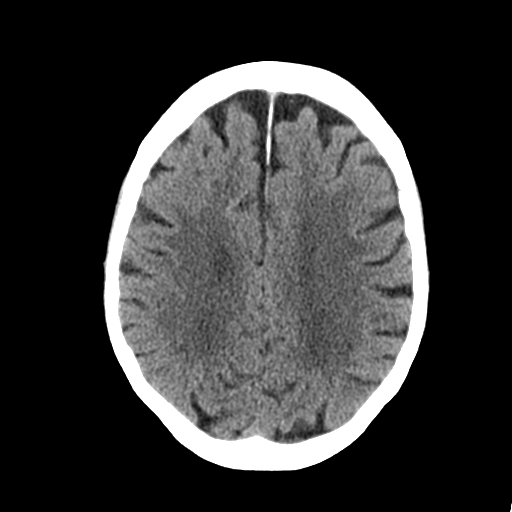
[im 19/31  bone]
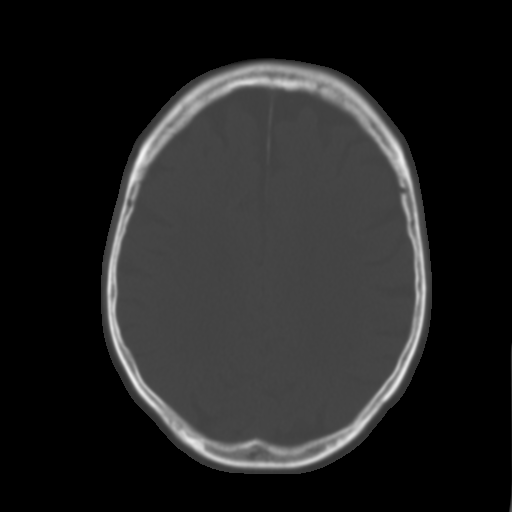
[im 23/31  brain]
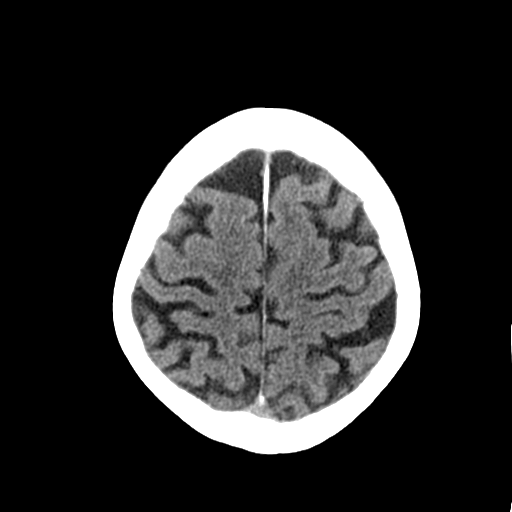
[im 27/31  brain]
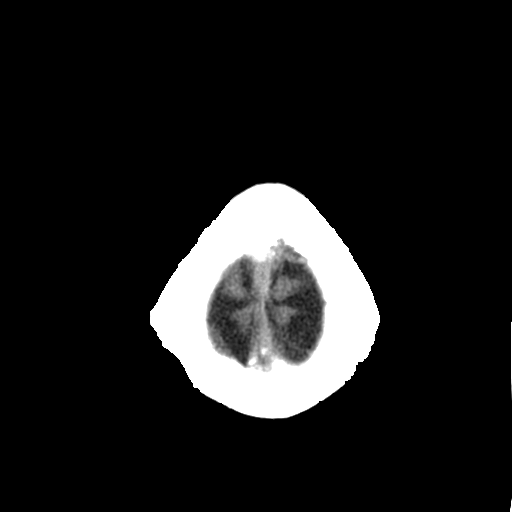

[Series 3: head bone · axial · 0.43mm/px · z∈[+1376,+1392]mm · 2 of 78 slices shown]
[im 8/78  bone]
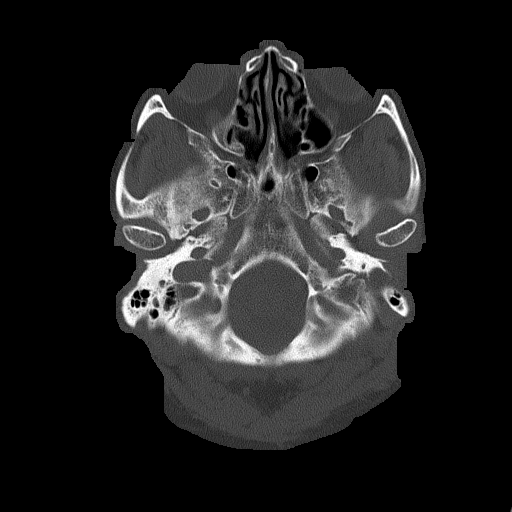
[im 16/78  bone]
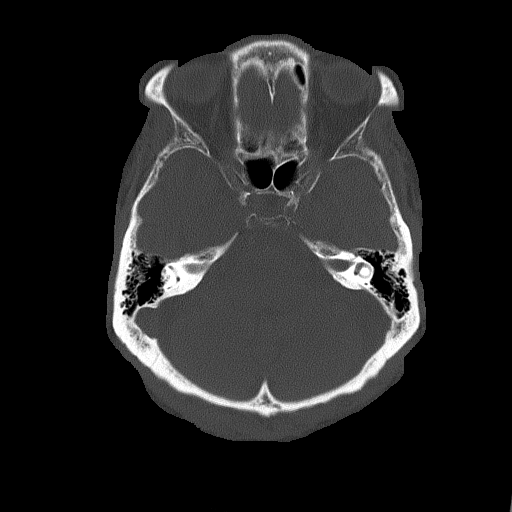

[Series 4: coronal soft · coronal · 0.31mm/px · 3 of 84 slices shown]
[im 28/84  brain]
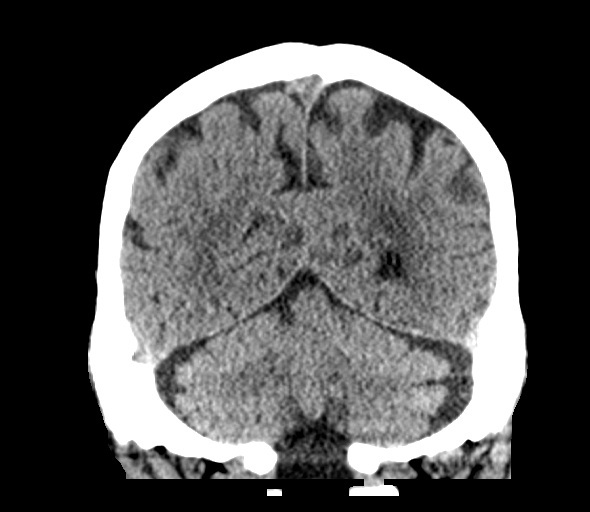
[im 37/84  brain]
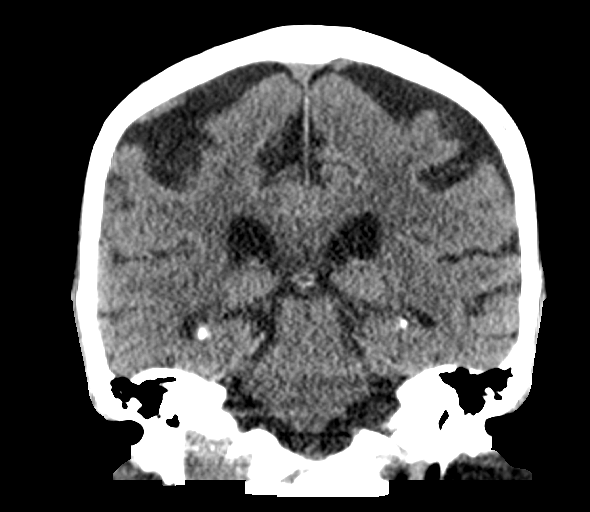
[im 47/84  brain]
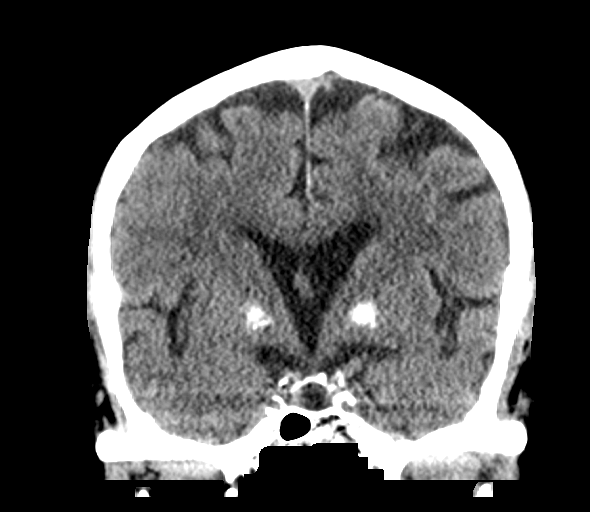

[Series 5: sagittal soft · sagittal · 0.32mm/px · 3 of 58 slices shown]
[im 20/58  brain]
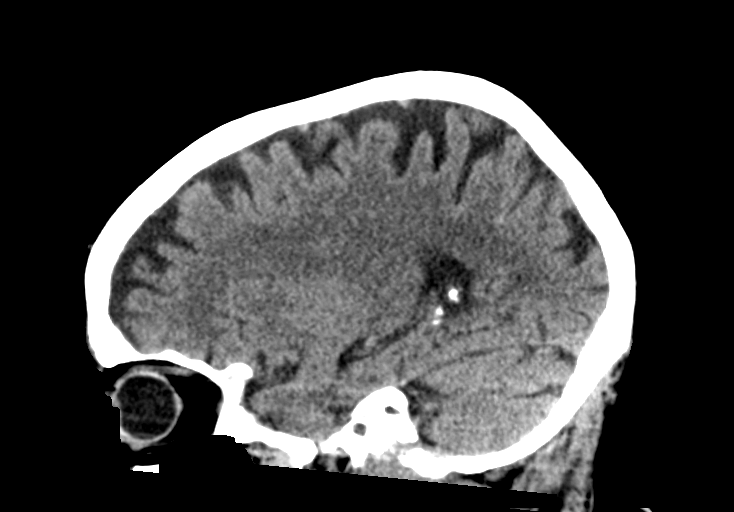
[im 29/58  brain]
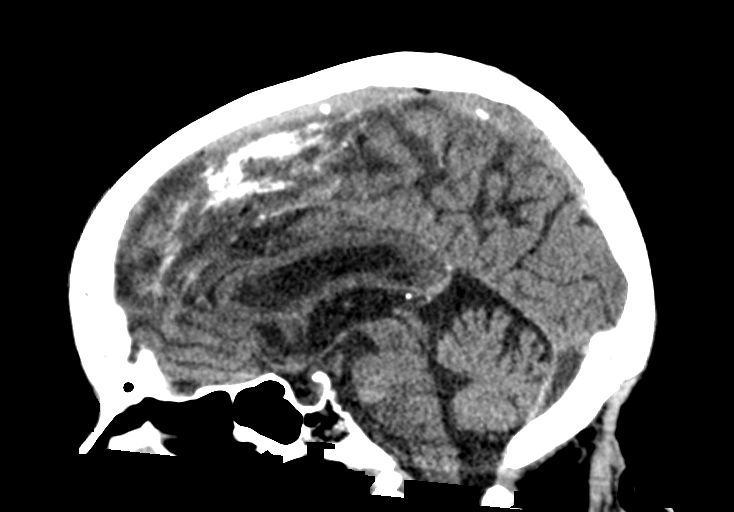
[im 39/58  brain]
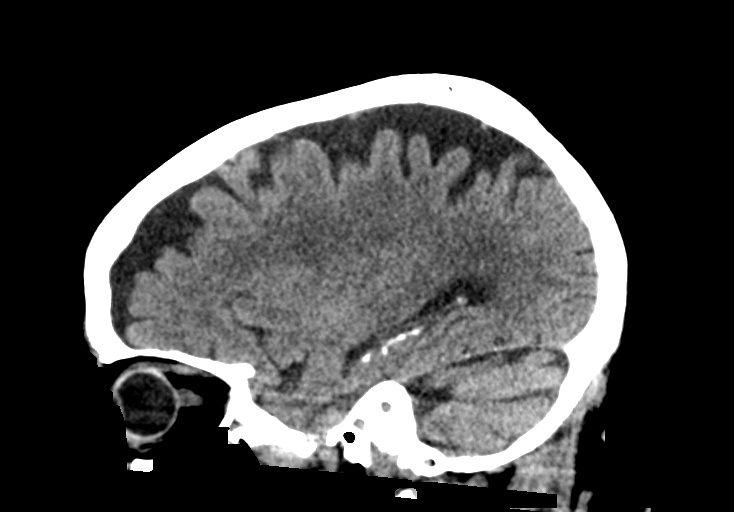

[15 of 47 positions shown; findings below may reference images not displayed]

FINDINGS: CT HEAD FINDINGS

Brain: No evidence of acute infarction, hemorrhage, hydrocephalus,
extra-axial collection or mass lesion/mass effect. Mild generalized
cerebral atrophy, expected for age. Scattered mild periventricular
and subcortical white matter hypodensities are nonspecific, but
favored to reflect chronic microvascular ischemic changes.

Vascular: Atherosclerotic vascular calcification of the carotid
siphons. No hyperdense vessel.

Skull: Normal. Negative for fracture or focal lesion.

Sinuses/Orbits: No acute finding. Mild bilateral ethmoid air cell
mucosal thickening.

Other: None.

CT CERVICAL SPINE FINDINGS

Alignment: No traumatic malalignment. Facet mediated 4 mm
anterolisthesis at C4-C5.

Skull base and vertebrae: No acute fracture. No primary bone lesion
or focal pathologic process.

Soft tissues and spinal canal: No prevertebral fluid or swelling. No
visible canal hematoma.

Disc levels: Moderate to severe disc height loss from C5-C6 through
C7-T1. Diffuse moderate facet arthropathy.

Upper chest: Negative.

Other: None.
IMPRESSION: 1. No acute intracranial abnormality.
2. No acute cervical spine fracture or traumatic malalignment.

## 2021-03-20 ENCOUNTER — Ambulatory Visit (INDEPENDENT_AMBULATORY_CARE_PROVIDER_SITE_OTHER): Payer: Medicare PPO

## 2021-03-20 DIAGNOSIS — Z0000 Encounter for general adult medical examination without abnormal findings: Secondary | ICD-10-CM | POA: Diagnosis not present

## 2021-03-20 NOTE — Progress Notes (Signed)
MEDICARE ANNUAL WELLNESS VISIT  03/20/2021  Telephone Visit Disclaimer This Medicare AWV was conducted by telephone due to national recommendations for restrictions regarding Kathryn COVID-19 Pandemic (e.g. social distancing).  I verified, using two identifiers, that I am speaking with Kathryn Henson or their authorized healthcare agent. I discussed Kathryn limitations, risks, security, and privacy concerns of performing an evaluation and management service by telephone and Kathryn potential availability of an in-person appointment in Kathryn future. Kathryn patient expressed understanding and agreed to proceed.  Location of Patient: Home Location of Provider (nurse):  [facility]  Subjective:    Kathryn Henson is a 87 y.o. female patient of Hawks, Theador Hawthorne, FNP who had a Medicare Annual Wellness Visit today via telephone. Kathryn Henson is Retired and lives alone. She has two children. She reports that she is socially active and does interact with friends/family regularly. She is minimally physically active and enjoys reading, working puzzles and gardening.  Patient Care Team: Sharion Balloon, FNP as PCP - General (Family Medicine) Harlen Labs, MD as Referring Physician (Optometry) Zadie Rhine Clent Demark, MD as Consulting Physician (Ophthalmology)  Advanced Directives 03/20/2021 02/14/2020 07/11/2019 12/09/2017 10/17/2014  Does Patient Have a Medical Advance Directive? Yes Yes Yes Yes Yes  Type of Paramedic of [place];Living will Living will Healthcare Power of [place];Living will [place];Living will  Does patient want to make changes to medical advance directive? No - Patient declined No - Patient declined No - Guardian declined No - Patient declined No - Patient declined  Copy of [place] in Chart? - - No - copy requested No - copy requested No - copy requested  Would patient like information on creating a medical  advance directive? - - - No - Patient declined -    Hospital Utilization Over Kathryn Past 12 Months: # of hospitalizations or ER visits: 1 # of surgeries: 0  Review of Systems    Patient reports that her overall health is unchanged compared to last year.  History obtained from chart review and Kathryn patient  Patient Reported Readings (BP, Pulse, CBG, Weight, etc) none  Pain Assessment Pain : No/denies pain     Current Medications & Allergies (verified) Allergies as of 03/20/2021       Reactions   Atorvastatin    Other reaction(s): Myalgias (intolerance)   Fluzone  [influenza Virus Vaccine] Swelling   Metformin Nausea Only   Sulfa Antibiotics Other (See Comments)   unknown        Medication List        Accurate as of March 20, 2021  4:21 PM. If you have any questions, ask your nurse or doctor.          Accu-Chek Guide test strip Generic drug: glucose blood CHECK GLUCOSE 4 TIMES DAILY Dx E11.65   aspirin 81 MG tablet Take 81 mg by mouth every morning.   BD Pen Needle Nano U/F 32G X 4 MM Misc Generic drug: Insulin Pen Needle 1 each by Does not apply route 4 (four) times daily.   Cholecalciferol 25 MCG (1000 UT) tablet Take 1,000 Units by mouth every morning.   CoQ-10 200 MG Caps 200 mg. Take 200 mg by mouth daily.   glimepiride 2 MG tablet Commonly known as: AMARYL TAKE 1 TABLET BY MOUTH TWICE DAILY WITH MEALS . APPOINTMENT REQUIRED FOR FUTURE REFILLS   lisinopril 10 MG tablet Commonly known as: ZESTRIL Take  1 tablet (10 mg total) by mouth every morning.   metoprolol tartrate 25 MG tablet Commonly known as: LOPRESSOR Take 1 tablet (25 mg total) by mouth 2 (two) times daily.   nitroGLYCERIN 0.4 MG SL tablet Commonly known as: NITROSTAT Place 1 tablet (0.4 mg total) under Kathryn tongue every 5 (five) minutes as needed for chest pain.   rosuvastatin 5 MG tablet Commonly known as: CRESTOR Take 1 tablet (5 mg total) by mouth every morning.         History (reviewed): Past Medical History:  Diagnosis Date   Breast cancer ([place])    Cancer ([place])    left breast   Diabetes mellitus without complication ([place])    Glaucoma    Hyperlipidemia    Hypertension    Osteoporosis    Pacemaker    Personal history of radiation therapy 04/2001   Past Surgical History:  Procedure Laterality Date   BREAST BIOPSY Left 03/11/2001   BREAST LUMPECTOMY Left 03/25/2001   Lumpectomy left breast     PACEMAKER INSERTION  04/08/2007   serial [id number], model [id number]   Family History  Problem Relation Age of Onset   Heart disease Mother    Diabetes Mother    Stroke Father    Breast cancer Sister    Breast cancer Daughter 54   Sarcoidosis Son    Breast cancer Sister    Breast cancer Other    Social History   Socioeconomic History   Marital status: Widowed    Spouse name: Not on file   Number of children: 3   Years of education: 16   Highest education level: Bachelor's degree (e.g., BA, AB, BS)  Occupational History   Occupation: REtired    Comment: Pharmacist, hospital  Tobacco Use   Smoking status: Former    Types: Cigarettes    Quit date: 05/25/1968    Years since quitting: 52.8   Smokeless tobacco: Never  Vaping Use   Vaping Use: Never used  Substance and Sexual Activity   Alcohol use: No   Drug use: No   Sexual activity: Not Currently  Other Topics Concern   Not on file  Social History Narrative   Not on file   Social Determinants of Health   Financial Resource Strain: Not on file  Food Insecurity: Not on file  Transportation Needs: Not on file  Physical Activity: Not on file  Stress: Not on file  Social Connections: Not on file    Activities of Daily Living In your present state of health, do you have any difficulty performing Kathryn following activities: 03/20/2021 03/04/2021  Hearing? N N  Vision? N N  Difficulty concentrating or making decisions? N N  Walking or climbing stairs? Y Y  Dressing or bathing? N N  Doing errands,  shopping? N N  Preparing Food and eating ? N -  Using Kathryn Toilet? N -  In Kathryn past six months, have you accidently leaked urine? N -  Do you have problems with loss of bowel control? N -  Managing your Medications? Y -  Managing your Finances? Y -  Housekeeping or managing your Housekeeping? Y -  Some recent data might be hidden  Patient requires Kathryn use of a cane when walking or climbing stairs.  Her daughter helps her to manage finance and take care of her medications.  She does have someone come in daily to help her with housekeeping.  Patient Education/ Literacy How often do you need to have someone help  you when you read instructions, pamphlets, or other written materials from your doctor or pharmacy?: 1 - Never What is Kathryn last grade level you completed in school?: College  Exercise Current Exercise Habits: Kathryn patient does not participate in regular exercise at present, Exercise limited by: None identified  Diet Patient reports consuming 3 meals a day and 1 snack(s) a day Patient reports that her primary diet is: Regular Patient reports that she does have regular access to food.   Depression Screen PHQ 2/9 Scores 03/04/2021 12/02/2020 11/28/2019 07/18/2018 06/07/2018 02/23/2018 12/09/2017  PHQ - 2 Score 0 0 0 1 0 0 2  PHQ- 9 Score 0 2 3 - - - 6  Exception Documentation - - - - - - -     Fall Risk Fall Risk  03/20/2021 03/04/2021 12/02/2020 11/28/2019 06/07/2018  Falls in Kathryn past year? 0 0 0 0 0  Number falls in past yr: - - - - -  Risk for fall due to : - - - - -  Follow up Falls evaluation completed - - - -     Objective:  Kathryn Henson seemed alert and oriented and she participated appropriately during our telephone visit.  Blood Pressure Weight BMI  BP Readings from Last 3 Encounters:  03/04/21 108/73  12/02/20 130/77  08/27/20 118/70   Wt Readings from Last 3 Encounters:  03/04/21 138 lb (62.6 kg)  12/02/20 137 lb 9.6 oz (62.4 kg)  08/27/20 138 lb 12.8 oz (63 kg)    BMI Readings from Last 1 Encounters:  03/04/21 26.95 kg/m    *Unable to obtain current vital signs, weight, and BMI due to telephone visit type  Hearing/Vision  Kathryn Henson did not seem to have difficulty with hearing/understanding during Kathryn telephone conversation Reports that she has had a formal eye exam by an eye care professional within Kathryn past year Reports that she has not had a formal hearing evaluation within Kathryn past year *Unable to fully assess hearing and vision during telephone visit type  Cognitive Function: 6CIT Screen 03/20/2021  What Year? 0 points  What month? 0 points  What time? 0 points  Count back from 20 0 points  Months in reverse 0 points  Repeat phrase 0 points  Total Score 0   (Normal:0-7, Significant for Dysfunction: >8)  Normal Cognitive Function Screening: Yes   Immunization & Health Maintenance Record Immunization History  Administered Date(s) Administered   HPV Bivalent 04/06/1994   Hepatitis B 06/12/1999, 08/06/1999, 11/12/1999   Influenza-Unspecified 04/09/1997, 04/09/2000   Moderna Sars-Covid-2 Vaccination 06/29/2019, 07/31/2019, 03/26/2020, 10/30/2020   PPD Test 03/03/2016   Pneumococcal Polysaccharide-23 05/25/1994   Td 06/28/1989, 11/22/2009   Tdap 12/02/2020    Health Maintenance  Topic Date Due   Zoster Vaccines- Shingrix (1 of 2) Never done   Pneumonia Vaccine 46+ Years old (2 - PCV) 05/26/1995   OPHTHALMOLOGY EXAM  10/02/2018   COVID-19 Vaccine (5 - Booster for Moderna series) 03/20/2021 (Originally 12/25/2020)   INFLUENZA VACCINE  08/22/2021 (Originally 12/23/2020)   DEXA SCAN  08/27/2021 (Originally 10/17/1989)   HEMOGLOBIN A1C  09/02/2021   FOOT EXAM  03/04/2022   TETANUS/TDAP  12/03/2030   HPV VACCINES  Aged Out       Assessment  This is a routine wellness examination for Kathryn Henson.  Health Maintenance: Due or Overdue Health Maintenance Due  Topic Date Due   Zoster Vaccines- Shingrix (1 of 2) Never done    Pneumonia Vaccine 77+ Years old (2 -  PCV) 05/26/1995   OPHTHALMOLOGY EXAM  10/02/2018    Kathryn Henson does not need a referral for Community Assistance: Care Management:   no Social Work:    no Prescription Assistance:  no Nutrition/Diabetes Education:  no   Plan:  Personalized Goals  Goals Addressed             This Visit's Progress    Patient Stated       03/20/2021 AWV Goal: Fall Prevention  Over Kathryn next year, patient will decrease their risk for falls by: Using assistive devices, such as a cane or walker, as needed Identifying fall risks within their home and correcting them by: Removing throw rugs Adding handrails to stairs or ramps Removing clutter and keeping a clear pathway throughout Kathryn home Increasing light, especially at night Adding shower handles/bars Raising toilet seat Identifying potential personal risk factors for falls: Medication side effects Incontinence/urgency Vestibular dysfunction Hearing loss Musculoskeletal disorders Neurological disorders Orthostatic hypotension       Patient Stated       03/20/2021 AWV Goal: Fall Prevention  Over Kathryn next year, patient will decrease their risk for falls by: Using assistive devices, such as a cane or walker, as needed Identifying fall risks within their home and correcting them by: Removing throw rugs Adding handrails to stairs or ramps Removing clutter and keeping a clear pathway throughout Kathryn home Increasing light, especially at night Adding shower handles/bars Raising toilet seat Identifying potential personal risk factors for falls: Medication side effects Incontinence/urgency Vestibular dysfunction Hearing loss Musculoskeletal disorders Neurological disorders Orthostatic hypotension         Personalized Health Maintenance & Screening Recommendations  Pneumococcal vaccine   Lung Cancer Screening Recommended: no (Low Dose CT Chest recommended if Age 27-80 years, 30 pack-year  currently smoking OR have quit w/in past 15 years) Hepatitis C Screening recommended: no HIV Screening recommended: no  Advanced Directives: Written information was not prepared per patient's request.  Referrals & Orders No orders of Kathryn defined types were placed in this encounter.   Follow-up Plan Follow-up with Sharion Balloon, FNP as planned    I have personally reviewed and noted Kathryn following in Kathryn patient's chart:   Medical and social history Use of alcohol, tobacco or illicit drugs  Current medications and supplements Functional ability and status Nutritional status Physical activity Advanced directives List of other physicians Hospitalizations, surgeries, and ER visits in previous 12 months Vitals Screenings to include cognitive, depression, and falls Referrals and appointments  In addition, I have reviewed and discussed with Kathryn Henson certain preventive protocols, quality metrics, and best practice recommendations. A written personalized care plan for preventive services as well as general preventive health recommendations is available and can be mailed to Kathryn patient at her request.      Burnadette Pop  03/20/2021

## 2021-06-11 ENCOUNTER — Other Ambulatory Visit: Payer: Self-pay | Admitting: Family

## 2021-06-11 DIAGNOSIS — E118 Type 2 diabetes mellitus with unspecified complications: Secondary | ICD-10-CM

## 2021-06-12 ENCOUNTER — Other Ambulatory Visit: Payer: Self-pay | Admitting: Family

## 2021-07-07 ENCOUNTER — Encounter: Payer: Self-pay | Admitting: Family

## 2021-07-07 ENCOUNTER — Ambulatory Visit: Payer: Medicare PPO | Admitting: Family

## 2021-07-07 VITALS — BP 131/77 | HR 74 | Temp 97.3°F | Ht 60.0 in | Wt 140.4 lb

## 2021-07-07 DIAGNOSIS — I5181 Takotsubo syndrome: Secondary | ICD-10-CM

## 2021-07-07 DIAGNOSIS — B3731 Acute candidiasis of vulva and vagina: Secondary | ICD-10-CM

## 2021-07-07 DIAGNOSIS — E1159 Type 2 diabetes mellitus with other circulatory complications: Secondary | ICD-10-CM

## 2021-07-07 DIAGNOSIS — E1165 Type 2 diabetes mellitus with hyperglycemia: Secondary | ICD-10-CM | POA: Diagnosis not present

## 2021-07-07 DIAGNOSIS — Z950 Presence of cardiac pacemaker: Secondary | ICD-10-CM | POA: Diagnosis not present

## 2021-07-07 DIAGNOSIS — E1169 Type 2 diabetes mellitus with other specified complication: Secondary | ICD-10-CM | POA: Diagnosis not present

## 2021-07-07 DIAGNOSIS — M1990 Unspecified osteoarthritis, unspecified site: Secondary | ICD-10-CM

## 2021-07-07 DIAGNOSIS — E785 Hyperlipidemia, unspecified: Secondary | ICD-10-CM | POA: Diagnosis not present

## 2021-07-07 DIAGNOSIS — I152 Hypertension secondary to endocrine disorders: Secondary | ICD-10-CM | POA: Diagnosis not present

## 2021-07-07 LAB — BAYER DCA HB A1C WAIVED: HB A1C (BAYER DCA - WAIVED): 11.2 % — ABNORMAL HIGH (ref 4.8–5.6)

## 2021-07-07 MED ORDER — FLUCONAZOLE 150 MG PO TABS: 150.0000 mg | ORAL_TABLET | ORAL | 0 refills | Status: DC | PRN

## 2021-07-07 NOTE — Patient Instructions (Signed)

## 2021-07-07 NOTE — Progress Notes (Signed)
Subjective:    Patient ID: Kathryn Henson, female    DOB: 29-Dec-1922, 88 y.o.   MRN: [id number]  Chief Complaint  Patient presents with   Medical Management of Chronic Issues   Vaginal Itching    Been on going. OTc meds not working    Pt presents to the office today for chronic follow up. She is followed by her Cardiologists  for cardiomyopathy and has a pacemaker in place. Vaginal Itching The patient's primary symptoms include genital itching and vaginal discharge. The patient's pertinent negatives include no genital rash or vaginal bleeding. This is a new problem. The current episode started more than 1 month ago. The problem occurs intermittently. The patient is experiencing no pain. She has tried antifungals for the symptoms. The treatment provided moderate relief.  Hypertension This is a chronic problem. The current episode started more than 1 year ago. The problem has been resolved since onset. The problem is controlled. Pertinent negatives include no blurred vision, malaise/fatigue, peripheral edema or shortness of breath. Risk factors for coronary artery disease include diabetes mellitus, dyslipidemia and sedentary lifestyle. The current treatment provides moderate improvement.  Hyperlipidemia This is a chronic problem. The current episode started more than 1 year ago. The problem is controlled. Recent lipid tests were reviewed and are normal. Pertinent negatives include no shortness of breath. Current antihyperlipidemic treatment includes statins. The current treatment provides moderate improvement of lipids. Risk factors for coronary artery disease include dyslipidemia, diabetes mellitus, hypertension and a sedentary lifestyle.  Diabetes She presents for her follow-up diabetic visit. She has type 2 diabetes mellitus. Associated symptoms include foot paresthesias. Pertinent negatives for diabetes include no blurred vision. Symptoms are stable. Diabetic complications include peripheral  neuropathy. Pertinent negatives for diabetic complications include no heart disease or nephropathy. Risk factors for coronary artery disease include dyslipidemia, diabetes mellitus, hypertension, sedentary lifestyle and post-menopausal. She is following a generally unhealthy diet. Her overall blood glucose range is >200 mg/dl.  Arthritis Presents for follow-up visit. She complains of pain and stiffness. The symptoms have been stable. Affected locations include the right MCP and left MCP (back). Her pain is at a severity of 2/10.     Review of Systems  Constitutional:  Negative for malaise/fatigue.  Eyes:  Negative for blurred vision.  Respiratory:  Negative for shortness of breath.   Genitourinary:  Positive for vaginal discharge.  Musculoskeletal:  Positive for arthritis and stiffness.  All other systems reviewed and are negative.     Objective:   Physical Exam Vitals reviewed.  Constitutional:      General: She is not in acute distress.    Appearance: She is well-developed.  HENT:     Head: Normocephalic and atraumatic.     Right Ear: Tympanic membrane normal.     Left Ear: Tympanic membrane normal.  Eyes:     Pupils: Pupils are equal, round, and reactive to light.  Neck:     Thyroid: No thyromegaly.  Cardiovascular:     Rate and Rhythm: Normal rate and regular rhythm.     Heart sounds: Murmur heard.  Pulmonary:     Effort: Pulmonary effort is normal. No respiratory distress.     Breath sounds: Normal breath sounds. No wheezing.  Abdominal:     General: Bowel sounds are normal. There is no distension.     Palpations: Abdomen is soft.     Tenderness: There is no abdominal tenderness.  Musculoskeletal:        General: No tenderness.  Normal range of motion.     Cervical back: Normal range of motion and neck supple.  Skin:    General: Skin is warm and dry.  Neurological:     Mental Status: She is alert and oriented to person, place, and time.     Cranial Nerves: No cranial  nerve deficit.     Deep Tendon Reflexes: Reflexes are normal and symmetric.  Psychiatric:        Behavior: Behavior normal.        Thought Content: Thought content normal.        Judgment: Judgment normal.      BP 131/77    Pulse 74    Temp (!) 97.3 F (36.3 C) (Temporal)    Ht 5' (1.524 m)    Wt 140 lb 6.4 oz (63.7 kg)    BMI 27.42 kg/m      Assessment & Plan:  Kathryn Henson comes in today with chief complaint of Medical Management of Chronic Issues and Vaginal Itching (Been on going. OTc meds not working )   Diagnosis and orders addressed:  1. Vagina, candidiasis Pt refuses physical exam of vagina today. Discussed if symptoms conitnue will need to be checked to rule out lichen sclerosus. - CMP14+EGFR - CBC with Differential/Platelet - fluconazole (DIFLUCAN) 150 MG tablet; Take 1 tablet (150 mg total) by mouth every three (3) days as needed.  Dispense: 3 tablet; Refill: 0  2. Hypertension associated with diabetes ([place])  - CMP14+EGFR - CBC with Differential/Platelet  3. Stress-induced cardiomyopathy  - CMP14+EGFR - CBC with Differential/Platelet  4. Uncontrolled type 2 diabetes mellitus with hyperglycemia ([facility]) -Refuses any insulin or injectable  - Bayer DCA Hb A1c Waived - CMP14+EGFR - CBC with Differential/Platelet  5. Hyperlipidemia associated with type 2 diabetes mellitus ([facility])  - CMP14+EGFR - CBC with Differential/Platelet  6. Arthritis - CMP14+EGFR - CBC with Differential/Platelet  7. Pacemaker - CMP14+EGFR - CBC with Differential/Platelet   Labs pending Health Maintenance reviewed Diet and exercise encouraged  Follow up plan: 6 months   Evelina Dun, FNP

## 2021-07-08 ENCOUNTER — Ambulatory Visit: Payer: Medicare PPO | Admitting: Family

## 2021-07-08 LAB — CBC WITH DIFFERENTIAL/PLATELET
Basophils Absolute: 0 10*3/uL (ref 0.0–0.2)
Basos: 1 %
EOS (ABSOLUTE): 0.1 10*3/uL (ref 0.0–0.4)
Eos: 2 %
Hematocrit: 38.9 % (ref 34.0–46.6)
Hemoglobin: 12.7 g/dL (ref 11.1–15.9)
Immature Grans (Abs): 0 10*3/uL (ref 0.0–0.1)
Immature Granulocytes: 0 %
Lymphocytes Absolute: 1.5 10*3/uL (ref 0.7–3.1)
Lymphs: 34 %
MCH: 30.6 pg (ref 26.6–33.0)
MCHC: 32.6 g/dL (ref 31.5–35.7)
MCV: 94 fL (ref 79–97)
Monocytes Absolute: 0.3 10*3/uL (ref 0.1–0.9)
Monocytes: 7 %
Neutrophils Absolute: 2.4 10*3/uL (ref 1.4–7.0)
Neutrophils: 56 %
Platelets: 177 10*3/uL (ref 150–450)
RBC: 4.15 x10E6/uL (ref 3.77–5.28)
RDW: 13 % (ref 11.7–15.4)
WBC: 4.4 10*3/uL (ref 3.4–10.8)

## 2021-07-08 LAB — CMP14+EGFR
ALT: 13 IU/L (ref 0–32)
AST: 13 IU/L (ref 0–40)
Albumin/Globulin Ratio: 1.5 (ref 1.2–2.2)
Albumin: 3.9 g/dL (ref 3.5–4.6)
Alkaline Phosphatase: 97 IU/L (ref 44–121)
BUN/Creatinine Ratio: 20 (ref 12–28)
BUN: 23 mg/dL (ref 10–36)
Bilirubin Total: 0.3 mg/dL (ref 0.0–1.2)
CO2: 22 mmol/L (ref 20–29)
Calcium: 9 mg/dL (ref 8.7–10.3)
Chloride: 103 mmol/L (ref 96–106)
Creatinine, Ser: 1.17 mg/dL — ABNORMAL HIGH (ref 0.57–1.00)
Globulin, Total: 2.6 g/dL (ref 1.5–4.5)
Glucose: 335 mg/dL — ABNORMAL HIGH (ref 70–99)
Potassium: 4.8 mmol/L (ref 3.5–5.2)
Sodium: 139 mmol/L (ref 134–144)
Total Protein: 6.5 g/dL (ref 6.0–8.5)
eGFR: 43 mL/min/{1.73_m2} — ABNORMAL LOW (ref >59)

## 2021-09-09 ENCOUNTER — Other Ambulatory Visit: Payer: Self-pay | Admitting: Family

## 2021-09-09 DIAGNOSIS — E118 Type 2 diabetes mellitus with unspecified complications: Secondary | ICD-10-CM

## 2021-10-06 ENCOUNTER — Ambulatory Visit: Payer: Medicare PPO | Admitting: Family

## 2021-10-23 ENCOUNTER — Encounter: Payer: Self-pay | Admitting: Family

## 2021-10-23 ENCOUNTER — Ambulatory Visit (INDEPENDENT_AMBULATORY_CARE_PROVIDER_SITE_OTHER): Payer: Medicare PPO

## 2021-10-23 ENCOUNTER — Ambulatory Visit: Payer: Medicare PPO | Admitting: Family

## 2021-10-23 VITALS — BP 134/90 | HR 81 | Temp 98.7°F | Ht 61.0 in | Wt 143.0 lb

## 2021-10-23 DIAGNOSIS — E785 Hyperlipidemia, unspecified: Secondary | ICD-10-CM | POA: Diagnosis not present

## 2021-10-23 DIAGNOSIS — M8589 Other specified disorders of bone density and structure, multiple sites: Secondary | ICD-10-CM | POA: Diagnosis not present

## 2021-10-23 DIAGNOSIS — M810 Age-related osteoporosis without current pathological fracture: Secondary | ICD-10-CM

## 2021-10-23 DIAGNOSIS — E1169 Type 2 diabetes mellitus with other specified complication: Secondary | ICD-10-CM

## 2021-10-23 DIAGNOSIS — M1990 Unspecified osteoarthritis, unspecified site: Secondary | ICD-10-CM | POA: Diagnosis not present

## 2021-10-23 DIAGNOSIS — Z950 Presence of cardiac pacemaker: Secondary | ICD-10-CM | POA: Diagnosis not present

## 2021-10-23 DIAGNOSIS — E1165 Type 2 diabetes mellitus with hyperglycemia: Secondary | ICD-10-CM | POA: Diagnosis not present

## 2021-10-23 DIAGNOSIS — E1159 Type 2 diabetes mellitus with other circulatory complications: Secondary | ICD-10-CM | POA: Diagnosis not present

## 2021-10-23 DIAGNOSIS — I152 Hypertension secondary to endocrine disorders: Secondary | ICD-10-CM | POA: Diagnosis not present

## 2021-10-23 DIAGNOSIS — Z780 Asymptomatic menopausal state: Secondary | ICD-10-CM | POA: Diagnosis not present

## 2021-10-23 LAB — CBC WITH DIFFERENTIAL/PLATELET
Basophils Absolute: 0 10*3/uL (ref 0.0–0.2)
Basos: 0 %
EOS (ABSOLUTE): 0.1 10*3/uL (ref 0.0–0.4)
Eos: 2 %
Hematocrit: 39.7 % (ref 34.0–46.6)
Hemoglobin: 13 g/dL (ref 11.1–15.9)
Immature Grans (Abs): 0 10*3/uL (ref 0.0–0.1)
Immature Granulocytes: 0 %
Lymphocytes Absolute: 1.5 10*3/uL (ref 0.7–3.1)
Lymphs: 36 %
MCH: 30.9 pg (ref 26.6–33.0)
MCHC: 32.7 g/dL (ref 31.5–35.7)
MCV: 94 fL (ref 79–97)
Monocytes Absolute: 0.4 10*3/uL (ref 0.1–0.9)
Monocytes: 9 %
Neutrophils Absolute: 2.2 10*3/uL (ref 1.4–7.0)
Neutrophils: 53 %
Platelets: 158 10*3/uL (ref 150–450)
RBC: 4.21 x10E6/uL (ref 3.77–5.28)
RDW: 13.2 % (ref 11.7–15.4)
WBC: 4.1 10*3/uL (ref 3.4–10.8)

## 2021-10-23 LAB — CMP14+EGFR
ALT: 17 IU/L (ref 0–32)
AST: 17 IU/L (ref 0–40)
Albumin/Globulin Ratio: 1.3 (ref 1.2–2.2)
Albumin: 3.9 g/dL (ref 3.5–4.6)
Alkaline Phosphatase: 88 IU/L (ref 44–121)
BUN/Creatinine Ratio: 16 (ref 12–28)
BUN: 18 mg/dL (ref 10–36)
Bilirubin Total: 0.4 mg/dL (ref 0.0–1.2)
CO2: 22 mmol/L (ref 20–29)
Calcium: 9.3 mg/dL (ref 8.7–10.3)
Chloride: 102 mmol/L (ref 96–106)
Creatinine, Ser: 1.14 mg/dL — ABNORMAL HIGH (ref 0.57–1.00)
Globulin, Total: 2.9 g/dL (ref 1.5–4.5)
Glucose: 350 mg/dL — ABNORMAL HIGH (ref 70–99)
Potassium: 4 mmol/L (ref 3.5–5.2)
Sodium: 138 mmol/L (ref 134–144)
Total Protein: 6.8 g/dL (ref 6.0–8.5)
eGFR: 44 mL/min/{1.73_m2} — ABNORMAL LOW (ref >59)

## 2021-10-23 LAB — BAYER DCA HB A1C WAIVED: HB A1C (BAYER DCA - WAIVED): 11.8 % — ABNORMAL HIGH (ref 4.8–5.6)

## 2021-10-23 MED ORDER — BLOOD GLUCOSE METER KIT: PACK | 0 refills | Status: DC

## 2021-10-23 NOTE — Addendum Note (Signed)
Addended by: Evelina Dun A on: 10/23/2021 10:35 AM   Modules accepted: Orders

## 2021-10-23 NOTE — Progress Notes (Signed)
Subjective:    Patient ID: Kathryn Henson, female    DOB: 09/24/1922, 88 y.o.   MRN: [id number]  Chief Complaint  Patient presents with   Follow-up    3 month    Pt presents to the office today for chronic follow up. She is followed by her Cardiologists  for cardiomyopathy and has a pacemaker in place. Hypertension This is a chronic problem. The current episode started more than 1 year ago. The problem has been resolved since onset. The problem is controlled. Associated symptoms include malaise/fatigue (some times). Pertinent negatives include no blurred vision, peripheral edema or shortness of breath. Risk factors for coronary artery disease include dyslipidemia and diabetes mellitus. The current treatment provides moderate improvement. Hypertensive end-organ damage includes CAD/MI.  Diabetes She presents for her follow-up diabetic visit. She has type 2 diabetes mellitus. Pertinent negatives for diabetes include no blurred vision and no foot paresthesias. Diabetic complications include heart disease and nephropathy. Pertinent negatives for diabetic complications include no peripheral neuropathy. Risk factors for coronary artery disease include dyslipidemia, diabetes mellitus, hypertension and post-menopausal. She is following a generally healthy diet. Her overall blood glucose range is >200 mg/dl. Eye exam is not current.  Hyperlipidemia This is a chronic problem. The current episode started more than 1 year ago. The problem is uncontrolled. Pertinent negatives include no shortness of breath. Current antihyperlipidemic treatment includes statins. The current treatment provides moderate improvement of lipids. Risk factors for coronary artery disease include dyslipidemia, diabetes mellitus and hypertension.  Arthritis Presents for follow-up visit. She complains of pain and stiffness. The symptoms have been stable. Affected locations include the right knee, left knee, right MCP, left MCP, left  ankle and right ankle. Her pain is at a severity of 2/10.     Review of Systems  Constitutional:  Positive for malaise/fatigue (some times).  Eyes:  Negative for blurred vision.  Respiratory:  Negative for shortness of breath.   Musculoskeletal:  Positive for arthritis and stiffness.  All other systems reviewed and are negative.     Objective:   Physical Exam Vitals reviewed.  Constitutional:      General: She is not in acute distress.    Appearance: She is well-developed.  HENT:     Head: Normocephalic and atraumatic.     Right Ear: Tympanic membrane normal.     Left Ear: Tympanic membrane normal.  Eyes:     Pupils: Pupils are equal, round, and reactive to light.  Neck:     Thyroid: No thyromegaly.  Cardiovascular:     Rate and Rhythm: Normal rate and regular rhythm.     Heart sounds: Murmur heard.  Pulmonary:     Effort: Pulmonary effort is normal. No respiratory distress.     Breath sounds: Normal breath sounds. No wheezing.  Abdominal:     General: Bowel sounds are normal. There is no distension.     Palpations: Abdomen is soft.     Tenderness: There is no abdominal tenderness.  Musculoskeletal:        General: No tenderness. Normal range of motion.     Cervical back: Normal range of motion and neck supple.  Skin:    General: Skin is warm and dry.  Neurological:     Mental Status: She is alert and oriented to person, place, and time.     Cranial Nerves: No cranial nerve deficit.     Deep Tendon Reflexes: Reflexes are normal and symmetric.  Psychiatric:  Behavior: Behavior normal.        Thought Content: Thought content normal.        Judgment: Judgment normal.      BP 134/90   Pulse 81   Temp 98.7 F (37.1 C)   Ht _0  (1.549 m)   Wt 143 lb (64.9 kg)   SpO2 96%   BMI 27.02 kg/m      Assessment & Plan:  Kathryn Henson comes in today with chief complaint of Follow-up (3 month)   Diagnosis and orders addressed:  1. Hypertension  associated with diabetes ([place]) - CMP14+EGFR - CBC with Differential/Platelet  2. Uncontrolled type 2 diabetes mellitus with hyperglycemia ([facility]) - Microalbumin / creatinine urine ratio - Bayer DCA Hb A1c Waived - CMP14+EGFR - CBC with Differential/Platelet  3. Hyperlipidemia associated with type 2 diabetes mellitus ([facility]) - CMP14+EGFR - CBC with Differential/Platelet  4. Osteoporosis, unspecified osteoporosis type, unspecified pathological fracture presence - DG [facility] DEXA; Future - CMP14+EGFR - CBC with Differential/Platelet  5. Arthritis - CMP14+EGFR - CBC with Differential/Platelet  6. Pacemaker - CMP14+EGFR - CBC with Differential/Platelet  Continues to refuse any medication changes at this time. Her A1C will be elevated.  Labs pending Health Maintenance reviewed Diet and exercise encouraged  Follow up plan: 3 months   Evelina Dun, FNP

## 2021-10-23 NOTE — Patient Instructions (Signed)

## 2021-10-24 ENCOUNTER — Other Ambulatory Visit: Payer: Self-pay | Admitting: Family

## 2021-10-24 LAB — MICROALBUMIN / CREATININE URINE RATIO
Creatinine, Urine: 81.5 mg/dL
Microalb/Creat Ratio: 32 mg/g creat — ABNORMAL HIGH (ref 0–29)
Microalbumin, Urine: 26.4 ug/mL

## 2021-10-24 MED ORDER — SITAGLIPTIN PHOSPHATE 50 MG PO TABS: 50.0000 mg | ORAL_TABLET | Freq: Every day | ORAL | 1 refills | Status: DC

## 2021-11-18 ENCOUNTER — Other Ambulatory Visit: Payer: Self-pay | Admitting: Family

## 2021-11-18 DIAGNOSIS — E1169 Type 2 diabetes mellitus with other specified complication: Secondary | ICD-10-CM

## 2021-11-30 ENCOUNTER — Other Ambulatory Visit: Payer: Self-pay | Admitting: Family

## 2021-11-30 DIAGNOSIS — E118 Type 2 diabetes mellitus with unspecified complications: Secondary | ICD-10-CM

## 2021-12-17 DIAGNOSIS — E119 Type 2 diabetes mellitus without complications: Secondary | ICD-10-CM | POA: Diagnosis not present

## 2021-12-17 DIAGNOSIS — Z853 Personal history of malignant neoplasm of breast: Secondary | ICD-10-CM | POA: Diagnosis not present

## 2021-12-17 DIAGNOSIS — I10 Essential (primary) hypertension: Secondary | ICD-10-CM | POA: Diagnosis not present

## 2021-12-17 DIAGNOSIS — Z7984 Long term (current) use of oral hypoglycemic drugs: Secondary | ICD-10-CM | POA: Diagnosis not present

## 2021-12-17 DIAGNOSIS — Z87891 Personal history of nicotine dependence: Secondary | ICD-10-CM | POA: Diagnosis not present

## 2021-12-17 DIAGNOSIS — E785 Hyperlipidemia, unspecified: Secondary | ICD-10-CM | POA: Diagnosis not present

## 2021-12-17 DIAGNOSIS — R32 Unspecified urinary incontinence: Secondary | ICD-10-CM | POA: Diagnosis not present

## 2021-12-17 DIAGNOSIS — Z950 Presence of cardiac pacemaker: Secondary | ICD-10-CM | POA: Diagnosis not present

## 2021-12-17 DIAGNOSIS — Z7982 Long term (current) use of aspirin: Secondary | ICD-10-CM | POA: Diagnosis not present

## 2022-01-25 DIAGNOSIS — R29702 NIHSS score 2: Secondary | ICD-10-CM | POA: Diagnosis not present

## 2022-01-25 DIAGNOSIS — R29810 Facial weakness: Secondary | ICD-10-CM | POA: Diagnosis not present

## 2022-01-25 DIAGNOSIS — I63533 Cerebral infarction due to unspecified occlusion or stenosis of bilateral posterior cerebral arteries: Secondary | ICD-10-CM | POA: Diagnosis not present

## 2022-01-25 DIAGNOSIS — E785 Hyperlipidemia, unspecified: Secondary | ICD-10-CM | POA: Diagnosis not present

## 2022-01-25 DIAGNOSIS — I10 Essential (primary) hypertension: Secondary | ICD-10-CM | POA: Diagnosis not present

## 2022-01-25 DIAGNOSIS — Z66 Do not resuscitate: Secondary | ICD-10-CM | POA: Diagnosis not present

## 2022-01-25 DIAGNOSIS — E119 Type 2 diabetes mellitus without complications: Secondary | ICD-10-CM | POA: Diagnosis not present

## 2022-01-25 DIAGNOSIS — R55 Syncope and collapse: Secondary | ICD-10-CM | POA: Diagnosis not present

## 2022-01-25 DIAGNOSIS — Z01818 Encounter for other preprocedural examination: Secondary | ICD-10-CM | POA: Diagnosis not present

## 2022-01-25 DIAGNOSIS — R4701 Aphasia: Secondary | ICD-10-CM | POA: Diagnosis not present

## 2022-01-25 DIAGNOSIS — Z950 Presence of cardiac pacemaker: Secondary | ICD-10-CM | POA: Diagnosis not present

## 2022-01-25 DIAGNOSIS — Z45010 Encounter for checking and testing of cardiac pacemaker pulse generator [battery]: Secondary | ICD-10-CM | POA: Diagnosis not present

## 2022-01-25 DIAGNOSIS — I6623 Occlusion and stenosis of bilateral posterior cerebral arteries: Secondary | ICD-10-CM | POA: Diagnosis not present

## 2022-01-25 DIAGNOSIS — I639 Cerebral infarction, unspecified: Secondary | ICD-10-CM | POA: Diagnosis not present

## 2022-01-25 DIAGNOSIS — G9389 Other specified disorders of brain: Secondary | ICD-10-CM | POA: Diagnosis not present

## 2022-01-25 DIAGNOSIS — I358 Other nonrheumatic aortic valve disorders: Secondary | ICD-10-CM | POA: Diagnosis not present

## 2022-01-25 DIAGNOSIS — R471 Dysarthria and anarthria: Secondary | ICD-10-CM | POA: Diagnosis not present

## 2022-01-27 ENCOUNTER — Ambulatory Visit: Payer: Medicare PPO | Admitting: Family

## 2022-01-29 DIAGNOSIS — I6930 Unspecified sequelae of cerebral infarction: Secondary | ICD-10-CM

## 2022-01-31 ENCOUNTER — Emergency Department (HOSPITAL_COMMUNITY)
Admission: EM | Admit: 2022-01-31 | Discharge: 2022-01-31 | Disposition: A | Discharge status: Home / Self Care | Payer: Medicare PPO | Attending: Emergency Medicine | Admitting: Emergency Medicine

## 2022-01-31 ENCOUNTER — Emergency Department (HOSPITAL_COMMUNITY): Payer: Medicare PPO

## 2022-01-31 ENCOUNTER — Other Ambulatory Visit: Payer: Self-pay

## 2022-01-31 ENCOUNTER — Encounter (HOSPITAL_COMMUNITY): Payer: Self-pay

## 2022-01-31 DIAGNOSIS — R42 Dizziness and giddiness: Secondary | ICD-10-CM | POA: Insufficient documentation

## 2022-01-31 DIAGNOSIS — R55 Syncope and collapse: Secondary | ICD-10-CM | POA: Diagnosis not present

## 2022-01-31 DIAGNOSIS — Z794 Long term (current) use of insulin: Secondary | ICD-10-CM | POA: Diagnosis not present

## 2022-01-31 DIAGNOSIS — I10 Essential (primary) hypertension: Secondary | ICD-10-CM | POA: Diagnosis not present

## 2022-01-31 DIAGNOSIS — R739 Hyperglycemia, unspecified: Secondary | ICD-10-CM | POA: Diagnosis not present

## 2022-01-31 DIAGNOSIS — Z950 Presence of cardiac pacemaker: Secondary | ICD-10-CM | POA: Insufficient documentation

## 2022-01-31 DIAGNOSIS — E119 Type 2 diabetes mellitus without complications: Secondary | ICD-10-CM | POA: Diagnosis not present

## 2022-01-31 DIAGNOSIS — Z7982 Long term (current) use of aspirin: Secondary | ICD-10-CM | POA: Diagnosis not present

## 2022-01-31 DIAGNOSIS — R011 Cardiac murmur, unspecified: Secondary | ICD-10-CM | POA: Diagnosis not present

## 2022-01-31 DIAGNOSIS — R0902 Hypoxemia: Secondary | ICD-10-CM | POA: Diagnosis not present

## 2022-01-31 DIAGNOSIS — Z79899 Other long term (current) drug therapy: Secondary | ICD-10-CM | POA: Insufficient documentation

## 2022-01-31 DIAGNOSIS — R531 Weakness: Secondary | ICD-10-CM | POA: Diagnosis not present

## 2022-01-31 HISTORY — DX: Cerebral infarction, unspecified: I63.9

## 2022-01-31 LAB — TROPONIN I (HIGH SENSITIVITY)
Troponin I (High Sensitivity): 22 ng/L — ABNORMAL HIGH (ref <18)
Troponin I (High Sensitivity): 22 ng/L — ABNORMAL HIGH (ref <18)

## 2022-01-31 LAB — URINALYSIS, ROUTINE W REFLEX MICROSCOPIC
Bilirubin Urine: NEGATIVE
Glucose, UA: >=500 mg/dL — AB
Ketones, ur: 20 mg/dL — AB
Nitrite: NEGATIVE
Protein, ur: NEGATIVE mg/dL
Specific Gravity, Urine: 1.02 (ref 1.005–1.030)
pH: 5 (ref 5.0–8.0)

## 2022-01-31 LAB — LIPASE, BLOOD: Lipase: 26 U/L (ref 11–51)

## 2022-01-31 LAB — COMPREHENSIVE METABOLIC PANEL
ALT: 28 U/L (ref 0–44)
AST: 35 U/L (ref 15–41)
Albumin: 3.2 g/dL — ABNORMAL LOW (ref 3.5–5.0)
Alkaline Phosphatase: 67 U/L (ref 38–126)
Anion gap: 7 (ref 5–15)
BUN: 21 mg/dL (ref 8–23)
CO2: 26 mmol/L (ref 22–32)
Calcium: 8.5 mg/dL — ABNORMAL LOW (ref 8.9–10.3)
Chloride: 105 mmol/L (ref 98–111)
Creatinine, Ser: 1.14 mg/dL — ABNORMAL HIGH (ref 0.44–1.00)
GFR, Estimated: 44 mL/min — ABNORMAL LOW (ref >60)
Glucose, Bld: 326 mg/dL — ABNORMAL HIGH (ref 70–99)
Potassium: 4.5 mmol/L (ref 3.5–5.1)
Sodium: 138 mmol/L (ref 135–145)
Total Bilirubin: 0.9 mg/dL (ref 0.3–1.2)
Total Protein: 6.7 g/dL (ref 6.5–8.1)

## 2022-01-31 LAB — CBC WITH DIFFERENTIAL/PLATELET
Abs Immature Granulocytes: 0.02 10*3/uL (ref 0.00–0.07)
Basophils Absolute: 0 10*3/uL (ref 0.0–0.1)
Basophils Relative: 1 %
Eosinophils Absolute: 0 10*3/uL (ref 0.0–0.5)
Eosinophils Relative: 1 %
HCT: 38.8 % (ref 36.0–46.0)
Hemoglobin: 12.7 g/dL (ref 12.0–15.0)
Immature Granulocytes: 0 %
Lymphocytes Relative: 13 %
Lymphs Abs: 0.8 10*3/uL (ref 0.7–4.0)
MCH: 31.6 pg (ref 26.0–34.0)
MCHC: 32.7 g/dL (ref 30.0–36.0)
MCV: 96.5 fL (ref 80.0–100.0)
Monocytes Absolute: 0.3 10*3/uL (ref 0.1–1.0)
Monocytes Relative: 5 %
Neutro Abs: 4.8 10*3/uL (ref 1.7–7.7)
Neutrophils Relative %: 80 %
Platelets: 150 10*3/uL (ref 150–400)
RBC: 4.02 MIL/uL (ref 3.87–5.11)
RDW: 13.6 % (ref 11.5–15.5)
WBC: 6 10*3/uL (ref 4.0–10.5)
nRBC: 0 % (ref 0.0–0.2)

## 2022-01-31 MED ORDER — CEPHALEXIN 500 MG PO CAPS
500.0000 mg | ORAL_CAPSULE | Freq: Once | ORAL | Status: AC
  Administered 2022-01-31: 500 mg via ORAL
  Filled 2022-01-31: qty 1

## 2022-01-31 MED ORDER — CEPHALEXIN 500 MG PO CAPS: 500.0000 mg | ORAL_CAPSULE | Freq: Four times a day (QID) | ORAL | 0 refills | Status: DC

## 2022-01-31 MED ORDER — ONDANSETRON 4 MG PO TBDP
4.0000 mg | ORAL_TABLET | Freq: Once | ORAL | Status: DC
  Filled 2022-01-31: qty 1

## 2022-01-31 NOTE — ED Provider Notes (Signed)
[facility] Provider Note   CSN: [id number] Arrival date & time: 01/31/22  1319     History  No chief complaint on file.   Kathryn Henson is a 88 y.o. female.  Patient is a 88 year old female with a past medical history of recent CVA last week treated at [facility] with mild left-sided deficits, hypertension, diabetes and has a pacemaker in place presenting to the emergency department with syncope.  Patient is here with her family members who states that she was discharged home last Wednesday and was doing well.  They state that she got up this morning and had a normal morning however after going to the bathroom she got up and was walking to the kitchen and became lightheaded, dizzy, diaphoretic and thought she was about to pass out.  Her family tried to sit her down and they state that she had a brief episode of unresponsiveness.  They deny any seizure-like activity.  They state that she did not fall to the ground or hit her head.  Patient denied any chest pain but did say that she felt short of breath during the episode.  She otherwise has been feeling well and denies any fever or chills, nausea, vomiting, diarrhea, black or bloody stools, dysuria or hematuria.  The history is provided by the patient and a relative.       Home Medications Prior to Admission medications   Medication Sig Start Date End Date Taking? Authorizing Provider  aspirin 81 MG tablet Take 81 mg by mouth every morning.     [provider]  blood glucose meter kit and supplies Dispense based on patient and insurance preference. Use up to four times daily as directed. (FOR ICD-10 E10.9, E11.9). 10/23/21   Sharion Balloon, FNP  Cholecalciferol 25 MCG (1000 UT) tablet Take 1,000 Units by mouth every morning.     [provider]  Coenzyme Q10 (COQ-10) 200 MG CAPS 200 mg. Take 200 mg by mouth daily.    [provider]  glimepiride (AMARYL) 2 MG tablet TAKE 1 TABLET BY MOUTH  TWICE DAILY WITH A MEAL 12/01/21   Hawks, [place] A, FNP  glucose blood (ACCU-CHEK GUIDE) test strip CHECK GLUCOSE 4 TIMES DAILY Dx E11.65 04/29/20   Evelina Dun A, FNP  Insulin Pen Needle (BD PEN NEEDLE NANO U/F) 32G X 4 MM MISC 1 each by Does not apply route 4 (four) times daily. 02/05/20   Cassandria Anger, MD  metoprolol tartrate (LOPRESSOR) 25 MG tablet Take 1 tablet (25 mg total) by mouth 2 (two) times daily. 03/04/21   Sharion Balloon, FNP  nitroGLYCERIN (NITROSTAT) 0.4 MG SL tablet Place 1 tablet (0.4 mg total) under the tongue every 5 (five) minutes as needed for chest pain. 03/04/21   Evelina Dun A, FNP  rosuvastatin (CRESTOR) 5 MG tablet TAKE 1 TABLET BY MOUTH ONCE DAILY IN THE MORNING 11/19/21   Evelina Dun A, FNP  sitaGLIPtin (JANUVIA) 50 MG tablet Take 1 tablet (50 mg total) by mouth daily. 10/24/21   Sharion Balloon, FNP      Allergies    Atorvastatin, Fluzone  [influenza virus vaccine], Metformin, and Sulfa antibiotics    Review of Systems   Review of Systems  Physical Exam Updated Vital Signs BP 130/74   Pulse 68   Temp 97.7 F (36.5 C)   Resp 17   Ht _0  (1.549 m)   Wt 64.9 kg   SpO2 92%   BMI  27.03 kg/m  Physical Exam Vitals and nursing note reviewed.  Constitutional:      General: She is not in acute distress.    Appearance: Normal appearance.  HENT:     Head: Normocephalic and atraumatic.     Nose: Nose normal.     Mouth/Throat:     Mouth: Mucous membranes are moist.  Eyes:     Extraocular Movements: Extraocular movements intact.     Comments: Mildly pale conjunctiva  Cardiovascular:     Rate and Rhythm: Normal rate and regular rhythm.     Pulses: Normal pulses.     Heart sounds: Murmur (systolic) heard.  Pulmonary:     Effort: Pulmonary effort is normal.     Breath sounds: Normal breath sounds.  Abdominal:     General: Abdomen is flat.     Palpations: Abdomen is soft.     Tenderness: There is no abdominal tenderness.   Musculoskeletal:        General: Normal range of motion.     Cervical back: Normal range of motion and neck supple.  Skin:    General: Skin is warm and dry.  Neurological:     General: No focal deficit present.     Mental Status: She is alert and oriented to person, place, and time.     Sensory: No sensory deficit.     Motor: No weakness.  Psychiatric:        Mood and Affect: Mood normal.        Behavior: Behavior normal.     ED Results / Procedures / Treatments   Labs (all labs ordered are listed, but only abnormal results are displayed) Labs Reviewed  COMPREHENSIVE METABOLIC PANEL  CBC WITH DIFFERENTIAL/PLATELET  LIPASE, BLOOD  URINALYSIS, ROUTINE W REFLEX MICROSCOPIC  TROPONIN I (HIGH SENSITIVITY)    EKG EKG Interpretation  Date/Time:  Saturday January 31 2022 14:00:02 EDT Ventricular Rate:  67 PR Interval:    QRS Duration: 156 QT Interval:  494 QTC Calculation: 522 R Axis:   -67 Text Interpretation: ATRIAL PACED RHYTHM Left bundle branch block Baseline wander Rhythm now paced compared to prior EKG Confirmed by Oneal Deputy [id number]) on 01/31/2022 2:24:07 PM  Radiology DG Chest 1 View  Result Date: 01/31/2022 CLINICAL DATA:  Syncope EXAM: CHEST  1 VIEW COMPARISON:  02/14/2020 FINDINGS: Left-sided implanted cardiac device. Stable cardiomediastinal contours. Calcified thoracic aortic atherosclerosis. No focal airspace consolidation, pleural effusion, or pneumothorax. IMPRESSION: No active disease. Electronically Signed   By: Davina Poke D.O.   On: 01/31/2022 15:02    Procedures Procedures    Medications Ordered in ED Medications - No data to display  ED Course/ Medical Decision Making/ A&P Clinical Course as of 01/31/22 1523  Sat Jan 31, 2022  1523 Patient signed out to Dr. Roderic Palau in stable condition pending labs and reassessment. [VK]    Clinical Course User Index [VK] Ottie Glazier, DO                           Medical Decision  Making This patient presents to the ED with chief complaint(s) of near syncope with pertinent past medical history of CVA, hypertension, diabetes, pacemaker in place which further complicates the presenting complaint. The complaint involves an extensive differential diagnosis and also carries with it a high risk of complications and morbidity.    The differential diagnosis includes pacemaker malfunction, arrhythmia, ACS, electrolyte abnormality, hypo or hyperglycemia, orthostatic hypotension, the patient  has no acute focal deficits making CVA unlikely,  Additional history obtained: Additional history obtained from family Records reviewed previous admission documents  ED Course and Reassessment: Upon patient's arrival to the emergency department she is hemodynamically stable and in a normal rhythm.  She does have a mild systolic murmur.  She will have labs, EKG and chest x-ray performed.  She was given Zofran for her nausea and she will be closely reassessed.  Independent labs interpretation:  The following labs were independently interpreted: Pending  Independent visualization of imaging: - I independently visualized the following imaging with scope of interpretation limited to determining acute life threatening conditions related to emergency care: Chest x-ray, which revealed acute disease   Amount and/or Complexity of Data Reviewed Labs: ordered. Radiology: ordered.           Final Clinical Impression(s) / ED Diagnoses Final diagnoses:  None    Rx / DC Orders ED Discharge Orders     None         Ottie Glazier, DO 01/31/22 1533

## 2022-01-31 NOTE — ED Notes (Signed)
Pt ambulated to restroom X 2 assist.

## 2022-01-31 NOTE — ED Triage Notes (Signed)
Pt was d/c'd from [facility] 9/6, diagnosed with stroke. Pt has residual weakness on L side and facial droop. Pt ate this am, had a syncopal episode while on the toilet approximately 1 min per family. Pt vomited x 1, was too weak to get off the toilet. Pt family wants to have her checked out.  CBG 324 en route.  20 g IV RAC 500 ml NS. Denies pain.

## 2022-01-31 NOTE — ED Notes (Signed)
Bladder scan showed approximately 350 ml urine

## 2022-01-31 NOTE — Discharge Instructions (Signed)
Drink plenty of fluids.  Start the prescription tomorrow for the UTI.  Follow-up next week with your doctor as planned.  Return if any more problems.

## 2022-01-31 NOTE — ED Provider Notes (Signed)
Patient with near syncopal episode today.  Patient feeling back to normal now that she had orthostatics were unremarkable and she ambulated without problems.  Urinalysis shows possible UTI.  We will culture her urine and start her on some Keflex.  Patient does not want to be observed in the hospital so she will be discharged home under her daughter's care and return if problems.  She has appointment Friday with her family doctor   Milton Ferguson, MD 01/31/22 1811

## 2022-02-02 DIAGNOSIS — Z950 Presence of cardiac pacemaker: Secondary | ICD-10-CM | POA: Diagnosis not present

## 2022-02-02 DIAGNOSIS — I447 Left bundle-branch block, unspecified: Secondary | ICD-10-CM | POA: Diagnosis not present

## 2022-02-02 DIAGNOSIS — I440 Atrioventricular block, first degree: Secondary | ICD-10-CM | POA: Diagnosis not present

## 2022-02-02 DIAGNOSIS — I491 Atrial premature depolarization: Secondary | ICD-10-CM | POA: Diagnosis not present

## 2022-02-04 ENCOUNTER — Telehealth: Payer: Self-pay | Admitting: Family

## 2022-02-04 LAB — URINE CULTURE: Culture: >=100000 — AB

## 2022-02-04 NOTE — Telephone Encounter (Signed)
VO given to add on ST

## 2022-02-04 NOTE — Telephone Encounter (Signed)
Kathryn Henson states pt was seen in the hospital for uti and was given abx rx as 4x a day but pt has only been taking once a day. The family also thinks metoprolol is too much for pt caregiver states when she seen pt this morning it was 100/ unknown number. She just wanted [place] to be aware and was wondering if anything needs to be changed

## 2022-02-05 ENCOUNTER — Telehealth (HOSPITAL_BASED_OUTPATIENT_CLINIC_OR_DEPARTMENT_OTHER): Payer: Self-pay | Admitting: *Deleted

## 2022-02-05 NOTE — Progress Notes (Signed)
ED Antimicrobial Stewardship Positive Culture Follow Up   EULA Kathryn Henson is an 88 y.o. female who presented to [facility] on 01/31/2022 with a chief complaint of No chief complaint on file.   Recent Results (from the past 720 hour(s))  Urine Culture     Status: Abnormal   Collection Time: 01/31/22  2:22 PM   Specimen: Urine, Clean Catch  Result Value Ref Range Status   Specimen Description   Final    URINE, CLEAN CATCH Performed at [facility], [address] [place], [place] [address]    Special Requests   Final    NONE Performed at [facility], [address] [place], [place] [address]    Culture (A)  Final    >=100,000 COLONIES/mL ENTEROBACTER AEROGENES 60,000 COLONIES/mL ENTEROBACTER AEROGENES TWO DIFFERENT COLONY MORPHOLOGIES Performed at [facility], [place] [address], [place], [place] [address]    Report Status 02/04/2022 FINAL  Final   Organism ID, Bacteria ENTEROBACTER AEROGENES (A)  Final   Organism ID, Bacteria ENTEROBACTER AEROGENES (A)  Final      Susceptibility   Enterobacter aerogenes - MIC*    CEFAZOLIN >=64 RESISTANT Resistant     CEFEPIME <=0.12 SENSITIVE Sensitive     CEFTRIAXONE <=0.25 SENSITIVE Sensitive     CIPROFLOXACIN <=0.25 SENSITIVE Sensitive     GENTAMICIN <=1 SENSITIVE Sensitive     IMIPENEM <=0.25 SENSITIVE Sensitive     NITROFURANTOIN 64 INTERMEDIATE Intermediate     TRIMETH/SULFA <=20 SENSITIVE Sensitive     PIP/TAZO <=4 SENSITIVE Sensitive    Enterobacter aerogenes - MIC*    CEFAZOLIN >=64 RESISTANT Resistant     CEFEPIME <=0.12 SENSITIVE Sensitive     CEFTRIAXONE 8 RESISTANT Resistant     CIPROFLOXACIN <=0.25 SENSITIVE Sensitive     GENTAMICIN <=1 SENSITIVE Sensitive     IMIPENEM 1 SENSITIVE Sensitive     NITROFURANTOIN 64 INTERMEDIATE Intermediate     TRIMETH/SULFA <=20 SENSITIVE Sensitive     PIP/TAZO >=128 RESISTANT Resistant     * >=100,000 COLONIES/mL ENTEROBACTER AEROGENES    60,000 COLONIES/mL ENTEROBACTER AEROGENES    [name] with recent CVA, presented with syncope. Patient became lightheaded, dizzy and diaphoretic. Family said patient had brief episode of unresponsiveness. Denied fever, chills, nausea, vomiting, dysuria and hematuria. History of syncope in the past, has pacemaker. UA showed 11-20 WBC, trace leukocytes, rare bacteria. Discussed with Dr. Sherry Ruffing. Organism is resistant to cephalexin. Not concerned for UTI, new antibiotics not indicated.   '[x]'$  Treated with cephalexin, organism resistant to prescribed antimicrobial '[]'$  Patient discharged originally without antimicrobial agent and treatment is now indicated  Plan: Stop cephalexin. New antibiotic prescription: none   ED Provider: Dr. Lacy Duverney 02/05/2022, 10:00 AM Clinical Pharmacist Monday - Friday phone -  [phone] Saturday - Sunday phone - [phone]

## 2022-02-05 NOTE — Telephone Encounter (Signed)
LM FOR CAROLINE TO R/C

## 2022-02-05 NOTE — Telephone Encounter (Signed)
Post ED Visit - Positive Culture Follow-up: Successful Patient Follow-Up  Culture assessed and recommendations reviewed by:  '[]'$  Elenor Quinones, Pharm.D. '[]'$  Heide Guile, Pharm.D., BCPS AQ-ID '[]'$  Parks Neptune, Pharm.D., BCPS '[]'$  Alycia Rossetti, Pharm.D., BCPS '[]'$  [place], [place].D., BCPS, AAHIVP '[]'$  Legrand Como, Pharm.D., BCPS, AAHIVP '[]'$  Salome Arnt, PharmD, BCPS '[]'$  Johnnette Gourd, PharmD, BCPS '[]'$  Hughes Better, PharmD, BCPS '[]'$  Leeroy Cha, PharmD  Positive urine culture  '[]'$  Patient discharged without antimicrobial prescription and treatment is now indicated '[]'$  Organism is resistant to prescribed ED discharge antimicrobial '[]'$  Patient with positive blood cultures  Changes discussed with ED provider: Dr. Sherry Ruffing No antibiotics indicated, stop Cephalexin   Ardeen Fillers 02/05/2022, 12:51 PM

## 2022-02-05 NOTE — Telephone Encounter (Signed)
Pt needs to be seen in person for hospital follow up. We can retest urine at that time and check VS and pulse rate.

## 2022-02-06 ENCOUNTER — Encounter: Payer: Self-pay | Admitting: Family

## 2022-02-06 ENCOUNTER — Ambulatory Visit: Payer: Medicare PPO | Admitting: Family

## 2022-02-06 VITALS — BP 120/74 | HR 69 | Temp 97.4°F | Ht 61.0 in | Wt 141.4 lb

## 2022-02-06 DIAGNOSIS — E1159 Type 2 diabetes mellitus with other circulatory complications: Secondary | ICD-10-CM

## 2022-02-06 DIAGNOSIS — I152 Hypertension secondary to endocrine disorders: Secondary | ICD-10-CM

## 2022-02-06 DIAGNOSIS — E1169 Type 2 diabetes mellitus with other specified complication: Secondary | ICD-10-CM | POA: Diagnosis not present

## 2022-02-06 DIAGNOSIS — E1165 Type 2 diabetes mellitus with hyperglycemia: Secondary | ICD-10-CM | POA: Diagnosis not present

## 2022-02-06 DIAGNOSIS — I6930 Unspecified sequelae of cerebral infarction: Secondary | ICD-10-CM

## 2022-02-06 DIAGNOSIS — E785 Hyperlipidemia, unspecified: Secondary | ICD-10-CM

## 2022-02-06 DIAGNOSIS — N3000 Acute cystitis without hematuria: Secondary | ICD-10-CM

## 2022-02-06 DIAGNOSIS — N1832 Chronic kidney disease, stage 3b: Secondary | ICD-10-CM | POA: Diagnosis not present

## 2022-02-06 MED ORDER — DAPAGLIFLOZIN PROPANEDIOL 5 MG PO TABS: 5.0000 mg | ORAL_TABLET | Freq: Every day | ORAL | 1 refills | Status: DC

## 2022-02-06 NOTE — Telephone Encounter (Signed)
Patient has appt today.

## 2022-02-06 NOTE — Patient Instructions (Signed)
Hospital Discharge After a Stroke  Being discharged from the hospital after a stroke can feel overwhelming. Many things may be different. It is normal to feel scared or anxious. Some stroke survivors may be able to return to their homes. Others may need more specialized care on a temporary or permanent basis. Your stroke care team will work with you to develop a discharge plan that is best for you. Ask questions if you do not understand something. Invite a friend or family member to participate in discharge planning. It is important to understand and follow your discharge plan to help prevent another stroke or other problems. General recommendations  Ask a friend or family member to get needed things in place before you go home if possible. A therapist can come to your home to make recommendations for safety equipment. Ask your health care provider if you would benefit from this service or from home care. Take steps to prevent falls, such as: Installing grab bars in the shower or using a shower chair. Install grab bars by the toilet. Removing tripping hazards, such as area rugs or cords. Supplies needed Ask your health care provider for a list of medical equipment and supplies you will need at home. These may include: Walkers. Canes. Wheelchairs. Hand-strengthening devices. Special eating utensils. Medical equipment can be rented or purchased, depending on your insurance coverage. Check with your insurance company about what is covered. Follow these instructions at home: Medicines Take all medicines exactly as told by your health care provider to prevent serious harm, such as another stroke. Make sure you understand: What medicine to take. Why you are taking the medicine. How and when to take it. If it can be taken with your other medicines and herbal supplements. Possible side effects, and when to call your health care provider if you have side effects. How you will get and pay for your  medicines. Medical assistance programs may be able to help you pay for prescription medicines if you cannot afford them. If you are taking an anticoagulant: Be sure to take it exactly as told by your health care provider. This medicine can increase the risk of bleeding because it works to prevent blood clots. You may need to take certain precautions to prevent bleeding. Do not take medicines that contain aspirin or NSAIDs, such as ibuprofen, unless your health care provider approves. These medicines increase your risk for dangerous bleeding. Preventing another stroke Having a stroke puts you at risk for another stroke in the future. Ask your health care provider what actions you can take to lower the risk. These may include: Increasing how much you exercise. Making a healthy eating plan. Quitting smoking. Managing other health conditions, such as high blood pressure, high cholesterol, or diabetes. Limiting alcohol use. General instructions Before you leave the hospital, you will be given information about stroke warning signs. Share these with your friends and family members. You will need to follow up regularly with a health care provider. You may need rehabilitation, such as physical therapy, occupational therapy, and speech-language therapy. Keeping these appointments is very important to your recovery. Be sure to bring your medicine list and discharge papers to your appointments. Use a calendar or appointment reminder if you need help to keep track of your schedule. Contact a health care provider if: You are taking an anticoagulant, and you have one or more of these problems: Bleeding or bruising. A fall or other injury to your head. Blood in your urine or stool (feces).  Get help right away if you:  Have any symptoms of a stroke. "BE FAST" is an easy way to remember the main warning signs of a stroke: B - Balance. Signs are dizziness, sudden trouble walking, or loss of balance. E - Eyes.  Signs are trouble seeing or a sudden change in vision. F - Face. Signs are sudden weakness or numbness of the face, or the face or eyelid drooping on one side. A - Arms. Signs are weakness or numbness in an arm. This happens suddenly and usually on one side of the body. S - Speech. Signs are sudden trouble speaking, slurred speech, or trouble understanding what people say. T - Time. Time to call emergency services. Write down what time symptoms started. Your emergency responders will need to know this information. Have other signs of a stroke, such as: A sudden, severe headache with no known cause. Nausea or vomiting. Seizure. These symptoms may represent a serious problem that is an emergency. Do not wait to see if the symptoms will go away. Get medical help right away. Call your local emergency services (911 in the [place] Do not drive yourself to the hospital. Summary Being discharged from the hospital after a stroke can feel overwhelming. It is normal to feel scared or anxious. Make sure you take medicines exactly as told by your health care provider. Know the warning signs of a stroke. Before you leave the hospital, you will be given information about stroke warning signs. Share these with your friends and family members. Get help right way if you have any symptoms of a stroke. "BE FAST" is an easy way to remember the main warning signs of a stroke. This information is not intended to replace advice given to you by your health care provider. Make sure you discuss any questions you have with your health care provider. Document Revised: 09/10/2021 Document Reviewed: 12/27/2019 Elsevier Patient Education  [place].

## 2022-02-06 NOTE — Progress Notes (Addendum)
Subjective:    Patient ID: Kathryn Henson, female    DOB: 07/13/1922, 88 y.o.   MRN: [id number]  Chief Complaint  Patient presents with   Hospitalization Follow-up   PT presents to the office today for hospital follow up after a CVA. She went to on 01/25/22 with new right facial droop and discharged on 01/28/22.   Given new facial droop, requesting suction as she is having excessive drooling. States she used this in the hospital and worked well.   She has a follow up with Neurologists 02/12/22 and Cardiologists to rule ou arrhythmia.   Told to continue aspirin 81 mg and started on Plavix 75 mg daily for 21 days. Her Crestor to was increased to 20 mg from 5 mg.   She also had a syncope episode  on 01/31/22 and found to have a UTI and was started on Keflex.   Has CKD and limits NSAID's.  Hypertension This is a chronic problem. The current episode started more than 1 year ago. The problem has been resolved since onset. The problem is controlled. Associated symptoms include malaise/fatigue. Pertinent negatives include no blurred vision, peripheral edema or shortness of breath. Risk factors for coronary artery disease include diabetes mellitus, sedentary lifestyle and dyslipidemia. The current treatment provides moderate improvement. Hypertensive end-organ damage includes CVA.  Hyperlipidemia This is a chronic problem. The current episode started more than 1 year ago. The problem is uncontrolled. Pertinent negatives include no shortness of breath. Current antihyperlipidemic treatment includes statins. The current treatment provides mild improvement of lipids. Risk factors for coronary artery disease include dyslipidemia, diabetes mellitus, hypertension, a sedentary lifestyle and post-menopausal.  Diabetes She presents for her follow-up diabetic visit. She has type 2 diabetes mellitus. Pertinent negatives for diabetes include no blurred vision and no foot paresthesias. Symptoms are stable.  Diabetic complications include a CVA. Risk factors for coronary artery disease include dyslipidemia, diabetes mellitus, hypertension, sedentary lifestyle and post-menopausal. She is following a generally healthy diet. Her overall blood glucose range is 180-200 mg/dl.      Review of Systems  Constitutional:  Positive for malaise/fatigue.  Eyes:  Negative for blurred vision.  Respiratory:  Negative for shortness of breath.   All other systems reviewed and are negative.      Objective:   Physical Exam Vitals reviewed.  Constitutional:      General: She is not in acute distress.    Appearance: She is well-developed.  HENT:     Head: Normocephalic and atraumatic.     Right Ear: Tympanic membrane normal.     Left Ear: Tympanic membrane normal.  Eyes:     Pupils: Pupils are equal, round, and reactive to light.  Neck:     Thyroid: No thyromegaly.  Cardiovascular:     Rate and Rhythm: Normal rate and regular rhythm.     Heart sounds: Normal heart sounds. No murmur heard. Pulmonary:     Effort: Pulmonary effort is normal. No respiratory distress.     Breath sounds: Normal breath sounds. No wheezing.  Abdominal:     General: Bowel sounds are normal. There is no distension.     Palpations: Abdomen is soft.     Tenderness: There is no abdominal tenderness.  Musculoskeletal:        General: No tenderness. Normal range of motion.     Cervical back: Normal range of motion and neck supple.  Skin:    General: Skin is warm and dry.  Neurological:  Mental Status: She is alert and oriented to person, place, and time.     Cranial Nerves: No cranial nerve deficit.     Deep Tendon Reflexes: Reflexes are normal and symmetric.  Psychiatric:        Behavior: Behavior normal.        Thought Content: Thought content normal.        Judgment: Judgment normal.          BP 120/74   Pulse 69   Temp (!) 97.4 F (36.3 C) (Temporal)   Ht 5' 1"  (1.549 m)   Wt 141 lb 6.4 oz (64.1 kg)   SpO2  (!) 69%   BMI 26.72 kg/m   Assessment & Plan:  Kathryn Henson comes in today with chief complaint of Hospitalization Follow-up   Diagnosis and orders addressed:  1. Late effect of cerebrovascular accident (CVA) - CMP14+EGFR - CBC with Differential/Platelet - dapagliflozin propanediol (FARXIGA) 5 MG TABS tablet; Take 1 tablet (5 mg total) by mouth daily before breakfast.  Dispense: 90 tablet; Refill: 1  2. Hypertension associated with diabetes (Kathryn Henson) - CMP14+EGFR - CBC with Differential/Platelet - dapagliflozin propanediol (FARXIGA) 5 MG TABS tablet; Take 1 tablet (5 mg total) by mouth daily before breakfast.  Dispense: 90 tablet; Refill: 1  3. Uncontrolled type 2 diabetes mellitus with hyperglycemia (HCC) - CMP14+EGFR - CBC with Differential/Platelet - Bayer DCA Hb A1c Waived - dapagliflozin propanediol (FARXIGA) 5 MG TABS tablet; Take 1 tablet (5 mg total) by mouth daily before breakfast.  Dispense: 90 tablet; Refill: 1  4. Hyperlipidemia associated with type 2 diabetes mellitus (HCC) - CMP14+EGFR - CBC with Differential/Platelet  5. Acute cystitis without hematuria - CMP14+EGFR - CBC with Differential/Platelet  6. Stage 3b chronic kidney disease (HCC) - dapagliflozin propanediol (FARXIGA) 5 MG TABS tablet; Take 1 tablet (5 mg total) by mouth daily before breakfast.  Dispense: 90 tablet; Refill: 1   Labs pending Continue Crestor, Plavix, and aspirin.  Start Farxiga 5 mg Low carb diet Health Maintenance reviewed Diet and exercise encouraged  Follow up plan: 3 months and keep follow up with Neurologists    Evelina Dun, FNP

## 2022-02-07 LAB — CBC WITH DIFFERENTIAL/PLATELET
Basophils Absolute: 0 10*3/uL (ref 0.0–0.2)
Basos: 1 %
EOS (ABSOLUTE): 0.1 10*3/uL (ref 0.0–0.4)
Eos: 3 %
Hematocrit: 39.9 % (ref 34.0–46.6)
Hemoglobin: 13 g/dL (ref 11.1–15.9)
Immature Grans (Abs): 0 10*3/uL (ref 0.0–0.1)
Immature Granulocytes: 0 %
Lymphocytes Absolute: 1.9 10*3/uL (ref 0.7–3.1)
Lymphs: 38 %
MCH: 31 pg (ref 26.6–33.0)
MCHC: 32.6 g/dL (ref 31.5–35.7)
MCV: 95 fL (ref 79–97)
Monocytes Absolute: 0.4 10*3/uL (ref 0.1–0.9)
Monocytes: 7 %
Neutrophils Absolute: 2.6 10*3/uL (ref 1.4–7.0)
Neutrophils: 51 %
Platelets: 165 10*3/uL (ref 150–450)
RBC: 4.2 x10E6/uL (ref 3.77–5.28)
RDW: 13.6 % (ref 11.7–15.4)
WBC: 5 10*3/uL (ref 3.4–10.8)

## 2022-02-07 LAB — CMP14+EGFR
ALT: 16 IU/L (ref 0–32)
AST: 18 IU/L (ref 0–40)
Albumin/Globulin Ratio: 1.4 (ref 1.2–2.2)
Albumin: 3.8 g/dL (ref 3.6–4.6)
Alkaline Phosphatase: 85 IU/L (ref 44–121)
BUN/Creatinine Ratio: 15 (ref 12–28)
BUN: 16 mg/dL (ref 10–36)
Bilirubin Total: 0.4 mg/dL (ref 0.0–1.2)
CO2: 24 mmol/L (ref 20–29)
Calcium: 9 mg/dL (ref 8.7–10.3)
Chloride: 104 mmol/L (ref 96–106)
Creatinine, Ser: 1.1 mg/dL — ABNORMAL HIGH (ref 0.57–1.00)
Globulin, Total: 2.8 g/dL (ref 1.5–4.5)
Glucose: 291 mg/dL — ABNORMAL HIGH (ref 70–99)
Potassium: 4.7 mmol/L (ref 3.5–5.2)
Sodium: 141 mmol/L (ref 134–144)
Total Protein: 6.6 g/dL (ref 6.0–8.5)
eGFR: 46 mL/min/{1.73_m2} — ABNORMAL LOW (ref >59)

## 2022-02-09 ENCOUNTER — Telehealth: Payer: Self-pay | Admitting: Family

## 2022-02-09 DIAGNOSIS — I6930 Unspecified sequelae of cerebral infarction: Secondary | ICD-10-CM

## 2022-02-09 DIAGNOSIS — N1832 Chronic kidney disease, stage 3b: Secondary | ICD-10-CM

## 2022-02-09 DIAGNOSIS — E1165 Type 2 diabetes mellitus with hyperglycemia: Secondary | ICD-10-CM

## 2022-02-09 DIAGNOSIS — I152 Hypertension secondary to endocrine disorders: Secondary | ICD-10-CM

## 2022-02-09 LAB — BAYER DCA HB A1C WAIVED: HB A1C (BAYER DCA - WAIVED): 12.6 % — ABNORMAL HIGH (ref 4.8–5.6)

## 2022-02-09 MED ORDER — EMPAGLIFLOZIN 10 MG PO TABS: 10.0000 mg | ORAL_TABLET | Freq: Every day | ORAL | 1 refills | Status: DC

## 2022-02-09 MED ORDER — DAPAGLIFLOZIN PROPANEDIOL 5 MG PO TABS: 5.0000 mg | ORAL_TABLET | Freq: Every day | ORAL | 1 refills | Status: DC

## 2022-02-09 NOTE — Addendum Note (Signed)
Addended by: Evelina Dun A on: 02/09/2022 02:27 PM   Modules accepted: Orders

## 2022-02-09 NOTE — Telephone Encounter (Signed)
Changed to Jardiance to see if insurance covers this.

## 2022-02-09 NOTE — Telephone Encounter (Signed)
Wilder Glade changed

## 2022-02-09 NOTE — Telephone Encounter (Signed)
Daughter wants to it change back to farixga

## 2022-02-09 NOTE — Telephone Encounter (Signed)
The patient went to the pharmacy to pick up Kathryn Henson, it is to expensive and she wants to know if anything else can be called in. Please call back and advise.

## 2022-02-10 ENCOUNTER — Ambulatory Visit (INDEPENDENT_AMBULATORY_CARE_PROVIDER_SITE_OTHER): Payer: Medicare PPO

## 2022-02-10 DIAGNOSIS — E785 Hyperlipidemia, unspecified: Secondary | ICD-10-CM | POA: Diagnosis not present

## 2022-02-10 DIAGNOSIS — Z853 Personal history of malignant neoplasm of breast: Secondary | ICD-10-CM | POA: Diagnosis not present

## 2022-02-10 DIAGNOSIS — I10 Essential (primary) hypertension: Secondary | ICD-10-CM | POA: Diagnosis not present

## 2022-02-10 DIAGNOSIS — I5181 Takotsubo syndrome: Secondary | ICD-10-CM | POA: Diagnosis not present

## 2022-02-10 DIAGNOSIS — Z87891 Personal history of nicotine dependence: Secondary | ICD-10-CM | POA: Diagnosis not present

## 2022-02-10 DIAGNOSIS — Z950 Presence of cardiac pacemaker: Secondary | ICD-10-CM

## 2022-02-10 DIAGNOSIS — E119 Type 2 diabetes mellitus without complications: Secondary | ICD-10-CM

## 2022-02-10 DIAGNOSIS — I69322 Dysarthria following cerebral infarction: Secondary | ICD-10-CM

## 2022-02-12 DIAGNOSIS — R55 Syncope and collapse: Secondary | ICD-10-CM | POA: Diagnosis not present

## 2022-02-12 DIAGNOSIS — E119 Type 2 diabetes mellitus without complications: Secondary | ICD-10-CM | POA: Diagnosis not present

## 2022-02-12 DIAGNOSIS — T82191A Other mechanical complication of cardiac pulse generator (battery), initial encounter: Secondary | ICD-10-CM | POA: Diagnosis not present

## 2022-02-12 DIAGNOSIS — I454 Nonspecific intraventricular block: Secondary | ICD-10-CM | POA: Diagnosis not present

## 2022-02-12 DIAGNOSIS — Z950 Presence of cardiac pacemaker: Secondary | ICD-10-CM | POA: Diagnosis not present

## 2022-02-12 DIAGNOSIS — E785 Hyperlipidemia, unspecified: Secondary | ICD-10-CM | POA: Diagnosis not present

## 2022-02-12 DIAGNOSIS — I440 Atrioventricular block, first degree: Secondary | ICD-10-CM | POA: Diagnosis not present

## 2022-02-12 DIAGNOSIS — I5181 Takotsubo syndrome: Secondary | ICD-10-CM | POA: Diagnosis not present

## 2022-02-12 DIAGNOSIS — I10 Essential (primary) hypertension: Secondary | ICD-10-CM | POA: Diagnosis not present

## 2022-02-12 DIAGNOSIS — I639 Cerebral infarction, unspecified: Secondary | ICD-10-CM | POA: Diagnosis not present

## 2022-02-12 DIAGNOSIS — E1165 Type 2 diabetes mellitus with hyperglycemia: Secondary | ICD-10-CM | POA: Diagnosis not present

## 2022-02-12 DIAGNOSIS — E1169 Type 2 diabetes mellitus with other specified complication: Secondary | ICD-10-CM | POA: Diagnosis not present

## 2022-02-12 DIAGNOSIS — R269 Unspecified abnormalities of gait and mobility: Secondary | ICD-10-CM | POA: Diagnosis not present

## 2022-02-12 DIAGNOSIS — Z853 Personal history of malignant neoplasm of breast: Secondary | ICD-10-CM | POA: Diagnosis not present

## 2022-02-14 DIAGNOSIS — Z45018 Encounter for adjustment and management of other part of cardiac pacemaker: Secondary | ICD-10-CM | POA: Diagnosis not present

## 2022-02-14 DIAGNOSIS — I447 Left bundle-branch block, unspecified: Secondary | ICD-10-CM | POA: Diagnosis not present

## 2022-02-14 DIAGNOSIS — I440 Atrioventricular block, first degree: Secondary | ICD-10-CM | POA: Diagnosis not present

## 2022-02-14 DIAGNOSIS — I499 Cardiac arrhythmia, unspecified: Secondary | ICD-10-CM | POA: Diagnosis not present

## 2022-02-26 ENCOUNTER — Other Ambulatory Visit: Payer: Self-pay | Admitting: Family

## 2022-02-27 NOTE — Telephone Encounter (Signed)
OV notes from 02/06/22 faxed to [phone]

## 2022-03-02 ENCOUNTER — Telehealth: Payer: Self-pay | Admitting: Family

## 2022-03-06 NOTE — Telephone Encounter (Signed)
Order faxed today, confirmation received.

## 2022-03-16 ENCOUNTER — Other Ambulatory Visit: Payer: Self-pay | Admitting: Family

## 2022-03-16 DIAGNOSIS — E118 Type 2 diabetes mellitus with unspecified complications: Secondary | ICD-10-CM

## 2022-03-27 ENCOUNTER — Ambulatory Visit (INDEPENDENT_AMBULATORY_CARE_PROVIDER_SITE_OTHER): Payer: Medicare PPO | Admitting: Family Medicine

## 2022-03-27 ENCOUNTER — Encounter: Payer: Self-pay | Admitting: Family Medicine

## 2022-03-27 DIAGNOSIS — N3001 Acute cystitis with hematuria: Secondary | ICD-10-CM

## 2022-03-27 DIAGNOSIS — R3989 Other symptoms and signs involving the genitourinary system: Secondary | ICD-10-CM

## 2022-03-27 LAB — URINALYSIS, ROUTINE W REFLEX MICROSCOPIC
Bilirubin, UA: NEGATIVE
Leukocytes,UA: NEGATIVE
Nitrite, UA: POSITIVE — AB
Protein,UA: NEGATIVE
Specific Gravity, UA: 1.02 (ref 1.005–1.030)
Urobilinogen, Ur: 0.2 mg/dL (ref 0.2–1.0)
pH, UA: 5 (ref 5.0–7.5)

## 2022-03-27 LAB — MICROSCOPIC EXAMINATION: Renal Epithel, UA: NONE SEEN /hpf

## 2022-03-27 MED ORDER — CIPROFLOXACIN HCL 500 MG PO TABS: 500.0000 mg | ORAL_TABLET | Freq: Two times a day (BID) | ORAL | 0 refills | Status: AC

## 2022-03-27 NOTE — Addendum Note (Signed)
Addended by: Baruch Gouty on: 03/27/2022 02:41 PM   Modules accepted: Orders

## 2022-03-27 NOTE — Progress Notes (Signed)
Virtual Visit via telephone Note Due to COVID-19 pandemic this visit was conducted virtually. This visit type was conducted due to national recommendations for restrictions regarding the COVID-19 Pandemic (e.g. social distancing, sheltering in place) in an effort to limit this patient's exposure and mitigate transmission in our community. All issues noted in this document were discussed and addressed.  A physical exam was not performed with this format.   I connected with Kathryn Henson's daughter on 03/27/2022 at 0914 by telephone and verified that I am speaking with the correct person using two identifiers. Kathryn Henson is currently located at home and family is currently with them during visit. The provider, Monia Pouch, FNP is located in their office at time of visit.  I discussed the limitations, risks, security and privacy concerns of performing an evaluation and management service by virtual visit and the availability of in person appointments. I also discussed with the patient that there may be a patient responsible charge related to this service. The patient expressed understanding and agreed to proceed.  Subjective:  Patient ID: Kathryn Henson, female    DOB: 1922-05-29, 88 y.o.   MRN: [id number]  Chief Complaint:  No chief complaint on file.   HPI: Kathryn Henson is a 88 y.o. female presenting on 03/27/2022 for No chief complaint on file.   Daughter reports increased confusion and fatigue over the last four days. Concerned she has an UTI. States she spoke with pts neurologist and was told to see PCP to evaluate for UTI. She did have a recent stroke but has been doing well up until Monday. States her cognition and speech changed. She seems more confused and tired. No reported fever.   Relevant past medical, surgical, family, and social history reviewed and updated as indicated.  Allergies and medications reviewed and updated.   Past Medical History:  Diagnosis Date    Breast cancer ([place])    Cancer ([place])    left breast   Diabetes mellitus without complication ([place])    Glaucoma    Hyperlipidemia    Hypertension    Osteoporosis    Pacemaker    Personal history of radiation therapy 04/2001   Stroke [facility])     Past Surgical History:  Procedure Laterality Date   BREAST BIOPSY Left 03/11/2001   BREAST LUMPECTOMY Left 03/25/2001   Lumpectomy left breast     PACEMAKER INSERTION  04/08/2007   serial [id number], model [id number]    Social History   Socioeconomic History   Marital status: Widowed    Spouse name: Not on file   Number of children: 3   Years of education: 16   Highest education level: Bachelor's degree (e.g., BA, AB, BS)  Occupational History   Occupation: REtired    Comment: Pharmacist, hospital  Tobacco Use   Smoking status: Former    Types: Cigarettes    Quit date: 05/25/1968    Years since quitting: 53.8   Smokeless tobacco: Never  Vaping Use   Vaping Use: Never used  Substance and Sexual Activity   Alcohol use: No   Drug use: No   Sexual activity: Not Currently  Other Topics Concern   Not on file  Social History Narrative   Not on file   Social Determinants of Health   Financial Resource Strain: Low Risk  (12/09/2017)   Overall Financial Resource Strain (CARDIA)    Difficulty of Paying Living Expenses: Not hard at all  Food Insecurity: No Food Insecurity (12/09/2017)  Hunger Vital Sign    Worried About Running Out of Food in the Last Year: Never true    Ran Out of Food in the Last Year: Never true  Transportation Needs: No Transportation Needs (12/09/2017)   PRAPARE - Hydrologist (Medical): No    Lack of Transportation (Non-Medical): No  Physical Activity: Insufficiently Active (12/09/2017)   Exercise Vital Sign    Days of Exercise per Week: 3 days    Minutes of Exercise per Session: 10 min  Stress: Stress Concern Present (12/09/2017)   [place]    Feeling of Stress : To some extent  Social Connections: Moderately Integrated (12/09/2017)   Social Connection and Isolation Panel [NHANES]    Frequency of Communication with Friends and Family: More than three times a week    Frequency of Social Gatherings with Friends and Family: More than three times a week    Attends Religious Services: More than 4 times per year    Active Member of [employer] or Organizations: Yes    Attends Archivist Meetings: More than 4 times per year    Marital Status: Widowed  Intimate Partner Violence: Not At Risk (12/09/2017)   Humiliation, Afraid, Rape, and Kick questionnaire    Fear of Current or Ex-Partner: No    Emotionally Abused: No    Physically Abused: No    Sexually Abused: No    Outpatient Encounter Medications as of 03/27/2022  Medication Sig   aspirin 81 MG tablet Take 81 mg by mouth every morning.    blood glucose meter kit and supplies Dispense based on patient and insurance preference. Use up to four times daily as directed. (FOR ICD-10 E10.9, E11.9).   cephALEXin (KEFLEX) 500 MG capsule Take 1 capsule (500 mg total) by mouth 4 (four) times daily.   Cholecalciferol 25 MCG (1000 UT) tablet Take 1,000 Units by mouth every morning.    clopidogrel (PLAVIX) 75 MG tablet Take 75 mg by mouth every morning.   Coenzyme Q10 (COQ-10) 200 MG CAPS 200 mg. Take 200 mg by mouth daily.   dapagliflozin propanediol (FARXIGA) 5 MG TABS tablet Take 1 tablet (5 mg total) by mouth daily before breakfast.   glimepiride (AMARYL) 2 MG tablet TAKE 1 TABLET BY MOUTH TWICE DAILY WITH A MEAL   glucose blood (ACCU-CHEK GUIDE) test strip CHECK GLUCOSE 4 TIMES DAILY Dx E11.65   metoprolol tartrate (LOPRESSOR) 25 MG tablet Take 1 tablet (25 mg total) by mouth 2 (two) times daily.   nitroGLYCERIN (NITROSTAT) 0.4 MG SL tablet Place 1 tablet (0.4 mg total) under the tongue every 5 (five) minutes as needed for chest pain.   rosuvastatin (CRESTOR) 20 MG tablet  Take 20 mg by mouth every morning.   No facility-administered encounter medications on file as of 03/27/2022.    Allergies  Allergen Reactions   Atorvastatin     Other reaction(s): Myalgias (intolerance)   Fluzone  [Influenza Virus Vaccine] Swelling   Metformin Nausea Only   Sulfa Antibiotics Other (See Comments)    unknown    Review of Systems  Unable to perform ROS: Mental status change (ROS per daughter)  Constitutional:  Positive for activity change, appetite change and fatigue. Negative for fever.  Cardiovascular:  Negative for leg swelling.  Genitourinary:  Positive for decreased urine volume.  Neurological:  Positive for weakness.  Psychiatric/Behavioral:  Positive for confusion.  Observations/Objective: No vital signs or physical exam, this was a virtual health encounter.  Pt alert and oriented, answers all questions appropriately, and able to speak in full sentences.    Assessment and Plan: Diagnoses and all orders for this visit:  Suspected UTI Daughter reports increased confusion and fatigue since Monday. Concerns for UTI. Daughter will bring sample to office for testing. Further treatment pending results.  -     Urinalysis, Routine w reflex microscopic -     Urine Culture     Follow Up Instructions: Return if symptoms worsen or fail to improve.    I discussed the assessment and treatment plan with the patient. The patient was provided an opportunity to ask questions and all were answered. The patient agreed with the plan and demonstrated an understanding of the instructions.   The patient was advised to call back or seek an in-person evaluation if the symptoms worsen or if the condition fails to improve as anticipated.  The above assessment and management plan was discussed with the patient. The patient verbalized understanding of and has agreed to the management plan. Patient is aware to call the clinic if they develop any new symptoms or if  symptoms persist or worsen. Patient is aware when to return to the clinic for a follow-up visit. Patient educated on when it is appropriate to go to the emergency department.    I provided 12 minutes of time during this telephone encounter.   Monia Pouch, FNP-C [facility] [address] [place], [place] [address] [phone] 03/27/2022

## 2022-03-28 DIAGNOSIS — I69391 Dysphagia following cerebral infarction: Secondary | ICD-10-CM | POA: Diagnosis not present

## 2022-03-28 DIAGNOSIS — R627 Adult failure to thrive: Secondary | ICD-10-CM | POA: Diagnosis not present

## 2022-03-28 DIAGNOSIS — R4182 Altered mental status, unspecified: Secondary | ICD-10-CM | POA: Diagnosis not present

## 2022-03-28 DIAGNOSIS — E1165 Type 2 diabetes mellitus with hyperglycemia: Secondary | ICD-10-CM | POA: Diagnosis not present

## 2022-03-28 DIAGNOSIS — R633 Feeding difficulties, unspecified: Secondary | ICD-10-CM | POA: Diagnosis not present

## 2022-03-28 DIAGNOSIS — I639 Cerebral infarction, unspecified: Secondary | ICD-10-CM | POA: Diagnosis not present

## 2022-03-28 DIAGNOSIS — Z743 Need for continuous supervision: Secondary | ICD-10-CM | POA: Diagnosis not present

## 2022-03-28 DIAGNOSIS — Z515 Encounter for palliative care: Secondary | ICD-10-CM | POA: Diagnosis not present

## 2022-03-28 DIAGNOSIS — I5181 Takotsubo syndrome: Secondary | ICD-10-CM | POA: Diagnosis not present

## 2022-03-28 DIAGNOSIS — R1310 Dysphagia, unspecified: Secondary | ICD-10-CM | POA: Diagnosis not present

## 2022-03-28 DIAGNOSIS — I447 Left bundle-branch block, unspecified: Secondary | ICD-10-CM | POA: Diagnosis not present

## 2022-03-28 DIAGNOSIS — G459 Transient cerebral ischemic attack, unspecified: Secondary | ICD-10-CM | POA: Diagnosis not present

## 2022-03-28 DIAGNOSIS — Z87891 Personal history of nicotine dependence: Secondary | ICD-10-CM | POA: Diagnosis not present

## 2022-03-28 DIAGNOSIS — I63422 Cerebral infarction due to embolism of left anterior cerebral artery: Secondary | ICD-10-CM | POA: Diagnosis not present

## 2022-03-28 DIAGNOSIS — I10 Essential (primary) hypertension: Secondary | ICD-10-CM | POA: Diagnosis not present

## 2022-03-28 DIAGNOSIS — G8191 Hemiplegia, unspecified affecting right dominant side: Secondary | ICD-10-CM | POA: Diagnosis not present

## 2022-03-28 DIAGNOSIS — E119 Type 2 diabetes mellitus without complications: Secondary | ICD-10-CM | POA: Diagnosis not present

## 2022-03-28 DIAGNOSIS — R1312 Dysphagia, oropharyngeal phase: Secondary | ICD-10-CM | POA: Diagnosis not present

## 2022-03-28 DIAGNOSIS — E785 Hyperlipidemia, unspecified: Secondary | ICD-10-CM | POA: Diagnosis not present

## 2022-03-28 DIAGNOSIS — I358 Other nonrheumatic aortic valve disorders: Secondary | ICD-10-CM | POA: Diagnosis not present

## 2022-03-28 DIAGNOSIS — R55 Syncope and collapse: Secondary | ICD-10-CM | POA: Diagnosis not present

## 2022-03-28 DIAGNOSIS — R930 Abnormal findings on diagnostic imaging of skull and head, not elsewhere classified: Secondary | ICD-10-CM | POA: Diagnosis not present

## 2022-03-28 DIAGNOSIS — N390 Urinary tract infection, site not specified: Secondary | ICD-10-CM | POA: Diagnosis not present

## 2022-03-28 DIAGNOSIS — I69319 Unspecified symptoms and signs involving cognitive functions following cerebral infarction: Secondary | ICD-10-CM | POA: Diagnosis not present

## 2022-03-28 DIAGNOSIS — E1169 Type 2 diabetes mellitus with other specified complication: Secondary | ICD-10-CM | POA: Diagnosis not present

## 2022-03-28 DIAGNOSIS — Z950 Presence of cardiac pacemaker: Secondary | ICD-10-CM | POA: Diagnosis not present

## 2022-03-28 DIAGNOSIS — N179 Acute kidney failure, unspecified: Secondary | ICD-10-CM | POA: Diagnosis not present

## 2022-03-28 DIAGNOSIS — R29810 Facial weakness: Secondary | ICD-10-CM | POA: Diagnosis not present

## 2022-03-28 DIAGNOSIS — I69398 Other sequelae of cerebral infarction: Secondary | ICD-10-CM | POA: Diagnosis not present

## 2022-03-28 DIAGNOSIS — Z66 Do not resuscitate: Secondary | ICD-10-CM | POA: Diagnosis not present

## 2022-03-28 DIAGNOSIS — I63533 Cerebral infarction due to unspecified occlusion or stenosis of bilateral posterior cerebral arteries: Secondary | ICD-10-CM | POA: Diagnosis not present

## 2022-03-28 DIAGNOSIS — R531 Weakness: Secondary | ICD-10-CM | POA: Diagnosis not present

## 2022-03-28 DIAGNOSIS — I69351 Hemiplegia and hemiparesis following cerebral infarction affecting right dominant side: Secondary | ICD-10-CM | POA: Diagnosis not present

## 2022-04-02 DIAGNOSIS — G459 Transient cerebral ischemic attack, unspecified: Secondary | ICD-10-CM | POA: Diagnosis not present

## 2022-04-02 DIAGNOSIS — R279 Unspecified lack of coordination: Secondary | ICD-10-CM | POA: Diagnosis not present

## 2022-04-02 DIAGNOSIS — R1310 Dysphagia, unspecified: Secondary | ICD-10-CM | POA: Diagnosis not present

## 2022-04-02 DIAGNOSIS — D649 Anemia, unspecified: Secondary | ICD-10-CM | POA: Diagnosis not present

## 2022-04-02 DIAGNOSIS — Z7189 Other specified counseling: Secondary | ICD-10-CM | POA: Diagnosis not present

## 2022-04-02 DIAGNOSIS — Z950 Presence of cardiac pacemaker: Secondary | ICD-10-CM | POA: Diagnosis not present

## 2022-04-02 DIAGNOSIS — E1122 Type 2 diabetes mellitus with diabetic chronic kidney disease: Secondary | ICD-10-CM | POA: Diagnosis not present

## 2022-04-02 DIAGNOSIS — I10 Essential (primary) hypertension: Secondary | ICD-10-CM | POA: Diagnosis not present

## 2022-04-02 DIAGNOSIS — Z743 Need for continuous supervision: Secondary | ICD-10-CM | POA: Diagnosis not present

## 2022-04-02 DIAGNOSIS — E782 Mixed hyperlipidemia: Secondary | ICD-10-CM | POA: Diagnosis not present

## 2022-04-02 DIAGNOSIS — E1169 Type 2 diabetes mellitus with other specified complication: Secondary | ICD-10-CM | POA: Diagnosis not present

## 2022-04-02 DIAGNOSIS — E119 Type 2 diabetes mellitus without complications: Secondary | ICD-10-CM | POA: Diagnosis not present

## 2022-04-02 DIAGNOSIS — I63422 Cerebral infarction due to embolism of left anterior cerebral artery: Secondary | ICD-10-CM | POA: Diagnosis not present

## 2022-04-02 DIAGNOSIS — Z79899 Other long term (current) drug therapy: Secondary | ICD-10-CM | POA: Diagnosis not present

## 2022-04-02 DIAGNOSIS — D518 Other vitamin B12 deficiency anemias: Secondary | ICD-10-CM | POA: Diagnosis not present

## 2022-04-02 DIAGNOSIS — E559 Vitamin D deficiency, unspecified: Secondary | ICD-10-CM | POA: Diagnosis not present

## 2022-04-02 DIAGNOSIS — N390 Urinary tract infection, site not specified: Secondary | ICD-10-CM | POA: Diagnosis not present

## 2022-04-02 DIAGNOSIS — R531 Weakness: Secondary | ICD-10-CM | POA: Diagnosis not present

## 2022-04-02 DIAGNOSIS — E038 Other specified hypothyroidism: Secondary | ICD-10-CM | POA: Diagnosis not present

## 2022-04-02 DIAGNOSIS — I5181 Takotsubo syndrome: Secondary | ICD-10-CM | POA: Diagnosis not present

## 2022-04-02 DIAGNOSIS — E785 Hyperlipidemia, unspecified: Secondary | ICD-10-CM | POA: Diagnosis not present

## 2022-04-02 DIAGNOSIS — R55 Syncope and collapse: Secondary | ICD-10-CM | POA: Diagnosis not present

## 2022-04-02 LAB — URINE CULTURE

## 2022-04-06 DIAGNOSIS — N390 Urinary tract infection, site not specified: Secondary | ICD-10-CM | POA: Diagnosis not present

## 2022-04-06 DIAGNOSIS — Z950 Presence of cardiac pacemaker: Secondary | ICD-10-CM | POA: Diagnosis not present

## 2022-04-06 DIAGNOSIS — Z7189 Other specified counseling: Secondary | ICD-10-CM | POA: Diagnosis not present

## 2022-04-06 DIAGNOSIS — R1310 Dysphagia, unspecified: Secondary | ICD-10-CM | POA: Diagnosis not present

## 2022-04-06 DIAGNOSIS — Z79899 Other long term (current) drug therapy: Secondary | ICD-10-CM | POA: Diagnosis not present

## 2022-04-06 DIAGNOSIS — I10 Essential (primary) hypertension: Secondary | ICD-10-CM | POA: Diagnosis not present

## 2022-04-06 DIAGNOSIS — I63422 Cerebral infarction due to embolism of left anterior cerebral artery: Secondary | ICD-10-CM | POA: Diagnosis not present

## 2022-04-07 DIAGNOSIS — I63422 Cerebral infarction due to embolism of left anterior cerebral artery: Secondary | ICD-10-CM | POA: Diagnosis not present

## 2022-04-07 DIAGNOSIS — Z79899 Other long term (current) drug therapy: Secondary | ICD-10-CM | POA: Diagnosis not present

## 2022-04-07 DIAGNOSIS — E785 Hyperlipidemia, unspecified: Secondary | ICD-10-CM | POA: Diagnosis not present

## 2022-04-07 DIAGNOSIS — I10 Essential (primary) hypertension: Secondary | ICD-10-CM | POA: Diagnosis not present

## 2022-04-07 DIAGNOSIS — Z7189 Other specified counseling: Secondary | ICD-10-CM | POA: Diagnosis not present

## 2022-04-07 DIAGNOSIS — E119 Type 2 diabetes mellitus without complications: Secondary | ICD-10-CM | POA: Diagnosis not present

## 2022-04-08 DIAGNOSIS — R1310 Dysphagia, unspecified: Secondary | ICD-10-CM | POA: Diagnosis not present

## 2022-04-08 DIAGNOSIS — I63422 Cerebral infarction due to embolism of left anterior cerebral artery: Secondary | ICD-10-CM | POA: Diagnosis not present

## 2022-04-08 DIAGNOSIS — N390 Urinary tract infection, site not specified: Secondary | ICD-10-CM | POA: Diagnosis not present

## 2022-04-08 DIAGNOSIS — I10 Essential (primary) hypertension: Secondary | ICD-10-CM | POA: Diagnosis not present

## 2022-04-08 DIAGNOSIS — Z950 Presence of cardiac pacemaker: Secondary | ICD-10-CM | POA: Diagnosis not present

## 2022-04-09 DIAGNOSIS — N390 Urinary tract infection, site not specified: Secondary | ICD-10-CM | POA: Diagnosis not present

## 2022-04-09 DIAGNOSIS — Z79899 Other long term (current) drug therapy: Secondary | ICD-10-CM | POA: Diagnosis not present

## 2022-04-09 DIAGNOSIS — I5181 Takotsubo syndrome: Secondary | ICD-10-CM | POA: Diagnosis not present

## 2022-04-09 DIAGNOSIS — I63422 Cerebral infarction due to embolism of left anterior cerebral artery: Secondary | ICD-10-CM | POA: Diagnosis not present

## 2022-04-09 DIAGNOSIS — I10 Essential (primary) hypertension: Secondary | ICD-10-CM | POA: Diagnosis not present

## 2022-04-09 DIAGNOSIS — E785 Hyperlipidemia, unspecified: Secondary | ICD-10-CM | POA: Diagnosis not present

## 2022-04-09 DIAGNOSIS — E119 Type 2 diabetes mellitus without complications: Secondary | ICD-10-CM | POA: Diagnosis not present

## 2022-04-09 DIAGNOSIS — R1310 Dysphagia, unspecified: Secondary | ICD-10-CM | POA: Diagnosis not present

## 2022-04-09 DIAGNOSIS — Z950 Presence of cardiac pacemaker: Secondary | ICD-10-CM | POA: Diagnosis not present

## 2022-04-10 ENCOUNTER — Other Ambulatory Visit: Payer: Self-pay | Admitting: Family

## 2022-04-13 DIAGNOSIS — I10 Essential (primary) hypertension: Secondary | ICD-10-CM | POA: Diagnosis not present

## 2022-04-13 DIAGNOSIS — Z7189 Other specified counseling: Secondary | ICD-10-CM | POA: Diagnosis not present

## 2022-04-13 DIAGNOSIS — Z950 Presence of cardiac pacemaker: Secondary | ICD-10-CM | POA: Diagnosis not present

## 2022-04-13 DIAGNOSIS — I63422 Cerebral infarction due to embolism of left anterior cerebral artery: Secondary | ICD-10-CM | POA: Diagnosis not present

## 2022-04-13 DIAGNOSIS — R55 Syncope and collapse: Secondary | ICD-10-CM | POA: Diagnosis not present

## 2022-04-13 DIAGNOSIS — Z79899 Other long term (current) drug therapy: Secondary | ICD-10-CM | POA: Diagnosis not present

## 2022-04-20 DIAGNOSIS — I63422 Cerebral infarction due to embolism of left anterior cerebral artery: Secondary | ICD-10-CM | POA: Diagnosis not present

## 2022-04-20 DIAGNOSIS — I5181 Takotsubo syndrome: Secondary | ICD-10-CM | POA: Diagnosis not present

## 2022-04-20 DIAGNOSIS — E119 Type 2 diabetes mellitus without complications: Secondary | ICD-10-CM | POA: Diagnosis not present

## 2022-04-20 DIAGNOSIS — R1310 Dysphagia, unspecified: Secondary | ICD-10-CM | POA: Diagnosis not present

## 2022-04-20 DIAGNOSIS — Z950 Presence of cardiac pacemaker: Secondary | ICD-10-CM | POA: Diagnosis not present

## 2022-04-20 DIAGNOSIS — N390 Urinary tract infection, site not specified: Secondary | ICD-10-CM | POA: Diagnosis not present

## 2022-04-20 DIAGNOSIS — E785 Hyperlipidemia, unspecified: Secondary | ICD-10-CM | POA: Diagnosis not present

## 2022-04-20 DIAGNOSIS — Z79899 Other long term (current) drug therapy: Secondary | ICD-10-CM | POA: Diagnosis not present

## 2022-04-20 DIAGNOSIS — I10 Essential (primary) hypertension: Secondary | ICD-10-CM | POA: Diagnosis not present

## 2022-04-24 ENCOUNTER — Ambulatory Visit: Payer: Medicare PPO | Admitting: Family

## 2022-04-28 DIAGNOSIS — I639 Cerebral infarction, unspecified: Secondary | ICD-10-CM | POA: Diagnosis not present

## 2022-04-29 ENCOUNTER — Telehealth: Payer: Self-pay | Admitting: Family

## 2022-05-04 ENCOUNTER — Ambulatory Visit (INDEPENDENT_AMBULATORY_CARE_PROVIDER_SITE_OTHER): Payer: Medicare PPO

## 2022-05-04 DIAGNOSIS — N179 Acute kidney failure, unspecified: Secondary | ICD-10-CM

## 2022-05-04 DIAGNOSIS — I10 Essential (primary) hypertension: Secondary | ICD-10-CM

## 2022-05-04 DIAGNOSIS — R55 Syncope and collapse: Secondary | ICD-10-CM

## 2022-05-04 DIAGNOSIS — I5181 Takotsubo syndrome: Secondary | ICD-10-CM | POA: Diagnosis not present

## 2022-05-04 DIAGNOSIS — E785 Hyperlipidemia, unspecified: Secondary | ICD-10-CM

## 2022-05-04 DIAGNOSIS — I69351 Hemiplegia and hemiparesis following cerebral infarction affecting right dominant side: Secondary | ICD-10-CM

## 2022-05-04 DIAGNOSIS — I69392 Facial weakness following cerebral infarction: Secondary | ICD-10-CM

## 2022-05-04 DIAGNOSIS — E1165 Type 2 diabetes mellitus with hyperglycemia: Secondary | ICD-10-CM | POA: Diagnosis not present

## 2022-05-04 DIAGNOSIS — I69322 Dysarthria following cerebral infarction: Secondary | ICD-10-CM | POA: Diagnosis not present

## 2022-05-04 DIAGNOSIS — I69391 Dysphagia following cerebral infarction: Secondary | ICD-10-CM | POA: Diagnosis not present

## 2022-05-04 DIAGNOSIS — I639 Cerebral infarction, unspecified: Secondary | ICD-10-CM | POA: Diagnosis not present

## 2022-05-05 DIAGNOSIS — E1165 Type 2 diabetes mellitus with hyperglycemia: Secondary | ICD-10-CM | POA: Diagnosis not present

## 2022-05-05 DIAGNOSIS — E785 Hyperlipidemia, unspecified: Secondary | ICD-10-CM | POA: Diagnosis not present

## 2022-05-05 DIAGNOSIS — I639 Cerebral infarction, unspecified: Secondary | ICD-10-CM | POA: Diagnosis not present

## 2022-05-05 DIAGNOSIS — I10 Essential (primary) hypertension: Secondary | ICD-10-CM | POA: Diagnosis not present

## 2022-05-07 ENCOUNTER — Encounter: Payer: Self-pay | Admitting: Family

## 2022-05-07 ENCOUNTER — Telehealth (INDEPENDENT_AMBULATORY_CARE_PROVIDER_SITE_OTHER): Payer: Medicare PPO | Admitting: Family

## 2022-05-07 DIAGNOSIS — N1832 Chronic kidney disease, stage 3b: Secondary | ICD-10-CM

## 2022-05-07 DIAGNOSIS — E785 Hyperlipidemia, unspecified: Secondary | ICD-10-CM | POA: Diagnosis not present

## 2022-05-07 DIAGNOSIS — M8580 Other specified disorders of bone density and structure, unspecified site: Secondary | ICD-10-CM | POA: Diagnosis not present

## 2022-05-07 DIAGNOSIS — M1990 Unspecified osteoarthritis, unspecified site: Secondary | ICD-10-CM

## 2022-05-07 DIAGNOSIS — E1159 Type 2 diabetes mellitus with other circulatory complications: Secondary | ICD-10-CM

## 2022-05-07 DIAGNOSIS — I6930 Unspecified sequelae of cerebral infarction: Secondary | ICD-10-CM

## 2022-05-07 DIAGNOSIS — E1169 Type 2 diabetes mellitus with other specified complication: Secondary | ICD-10-CM | POA: Diagnosis not present

## 2022-05-07 DIAGNOSIS — I152 Hypertension secondary to endocrine disorders: Secondary | ICD-10-CM

## 2022-05-07 DIAGNOSIS — Z09 Encounter for follow-up examination after completed treatment for conditions other than malignant neoplasm: Secondary | ICD-10-CM | POA: Diagnosis not present

## 2022-05-07 NOTE — Patient Instructions (Signed)
Physical Therapy After a Stroke After a stroke, you may have physical changes or problems. Physical therapy can help you get better and deal with problems, such as: Weakness or not being able to move (paralysis). This may affect one side of your body. Trouble with balance. Stiffness in your muscles and joints. Changes to coordination and reflexes. Muscle tightening that you cannot control (spasticity). Pain, a prickling and tingling feeling, or numbness in parts of your body. You may also have trouble feeling touch, pressure, or changes in temperature. What causes physical problems after a stroke? A stroke can damage the parts of your brain that control how your body works. These include your ability to move and keep your balance. The types of problems you have will depend on how severe your stroke was and where it was in your brain. Weakness or paralysis may affect just your fingers and hands, a leg or arm, or an entire side of your body. What is physical therapy? Physical therapy uses exercises, stretches, and activities to help you move better and regain independence after a stroke. It may focus on: Range of motion. This can help you move better and reduce muscle stiffness. Balance. This helps to lower your risk of falling. Position changes. These may include moving from sitting to standing or from a chair to a bed. Coordination. This may include getting an object from a shelf. Muscle strength. Weights, and repeated motions, can help to improve strength. Functional mobility. This may include learning how to go up or down stairs or how to use a wheelchair, walker, or cane. Gait training. This can help you learn how to walk better. Activities of daily living. This may include how to get out of a car or how to button a shirt. Why is physical therapy important? Physical therapy can: Help you regain independence. Stop you from falling and hurting yourself. Lower your risk of blood  clots. Lower your risk of pressure injuries. These are also called skin sores. Increase your physical activity and exercise. This may help lower your risk for another stroke. Help reduce pain. Do exercises and follow your rehabilitation, or rehab, plan as told by your physical therapist. When will therapy start and where will I have therapy? Your health care provider will decide when you can start therapy. In some cases, you may be able to start rehab, such as physical therapy, as soon as you are medically stable. Where rehab happens will depend on your needs. It may take place in: The hospital or an inpatient rehab hospital. An outpatient rehab facility. A long-term care facility. A community rehab clinic. Your home. What are assistive devices? Assistive devices are tools to help you move, keep your balance, and manage daily tasks while you recover from a stroke. Your physical therapist may recommend and help you learn to use: Equipment to help you move. This may include a wheelchair, cane, or walker. Braces or splints. These can help keep your arms, hands, legs, or feet in a comfortable and safe position. Bathtub benches or grab bars. These can help keep you safe in the bathroom. Special utensils, bowls, and plates. These can allow you to eat with one hand. Use these devices as told by your health care provider. General information Do not use any products that contain nicotine or tobacco. These include cigarettes, chewing tobacco, and vaping devices, such as e-cigarettes. If you need help quitting, ask your health care provider. Keep all follow-up visits to make sure all your needs are  being met and to catch any problems early. This information is not intended to replace advice given to you by your health care provider. Make sure you discuss any questions you have with your health care provider. Document Revised: 10/16/2021 Document Reviewed: 10/16/2021 Elsevier Patient Education  [place].

## 2022-05-07 NOTE — Progress Notes (Signed)
Virtual Visit Consent   Kathryn Henson, you are scheduled for a virtual visit with a [facility] provider today. Just as with appointments in the office, your consent must be obtained to participate. Your consent will be active for this visit and any virtual visit you may have with one of our providers in the next 365 days. If you have a MyChart account, a copy of this consent can be sent to you electronically.  As this is a virtual visit, video technology does not allow for your provider to perform a traditional examination. This may limit your provider's ability to fully assess your condition. If your provider identifies any concerns that need to be evaluated in person or the need to arrange testing (such as labs, EKG, etc.), we will make arrangements to do so. Although advances in technology are sophisticated, we cannot ensure that it will always work on either your end or our end. If the connection with a video visit is poor, the visit may have to be switched to a telephone visit. With either a video or telephone visit, we are not always able to ensure that we have a secure connection.  By engaging in this virtual visit, you consent to the provision of healthcare and authorize for your insurance to be billed (if applicable) for the services provided during this visit. Depending on your insurance coverage, you may receive a charge related to this service.  I need to obtain your verbal consent now. Are you willing to proceed with your visit today? Kathryn Henson has provided verbal consent on 05/07/2022 for a virtual visit (video or telephone). Evelina Dun, FNP  Date: 05/07/2022 11:11 AM  Virtual Visit via Video Note   I, Evelina Dun, connected with  Kathryn Henson  ([id number], [birth year]) on 05/07/22 at 10:40 AM EST by a video-enabled telemedicine application and verified that I am speaking with the correct person using two identifiers.  Location: Patient: Virtual Visit Location  Patient: Home Provider: Virtual Visit Location Provider: Office/Clinic   I discussed the limitations of evaluation and management by telemedicine and the availability of in person appointments. The patient expressed understanding and agreed to proceed.    History of Present Illness: Kathryn Henson is a 88 y.o. who identifies as a female who was assigned female at birth, and is being seen today for hospital follow up.  PT presents to the office today for hospital follow up. She had a second CVA and is followed by Neurologists.  She is continued the Plavix daily and Crestor 20 mg.   She is doing PT, OT, and speech. She has 24 hour total care at this time. She has aphasia and generalized weakness.     Has CKD and limits NSAID's.   HPI: Diabetes She presents for her follow-up diabetic visit. She has type 2 diabetes mellitus. Associated symptoms include blurred vision. Symptoms are stable. Diabetic complications include a CVA. Risk factors for coronary artery disease include dyslipidemia, diabetes mellitus, hypertension, sedentary lifestyle and post-menopausal. She is following a generally healthy diet. Her overall blood glucose range is 70-90 mg/dl.  Hypertension Associated symptoms include blurred vision. Risk factors for coronary artery disease include dyslipidemia, diabetes mellitus and sedentary lifestyle. The current treatment provides moderate improvement. Hypertensive end-organ damage includes CVA.  Hyperlipidemia This is a chronic problem. The current episode started more than 1 year ago. Current antihyperlipidemic treatment includes statins. The current treatment provides moderate improvement of lipids. Risk factors for coronary artery disease include dyslipidemia,  diabetes mellitus, hypertension and a sedentary lifestyle.  Arthritis Presents for follow-up visit. She complains of pain and stiffness. The symptoms have been resolved. Affected locations include the left knee and right knee.  Her pain is at a severity of 1/10.    Problems:  Patient Active Problem List   Diagnosis Date Noted   Late effect of cerebrovascular accident (CVA) 05/07/2022   Stage 3b chronic kidney disease ([place]) 02/06/2022   Arthritis 08/11/2013   Seborrheic keratoses, inflamed 05/29/2013   Allergic rhinitis 05/13/2013   Hyperlipidemia associated with type 2 diabetes mellitus ([place]) 05/13/2013   Osteopenia    Diabetes mellitus ([place])    Glaucoma    Hypertension associated with diabetes ([place])    Pacemaker    Stress-induced cardiomyopathy 11/17/2011   Takotsubo syndrome 11/17/2011    Allergies:  Allergies  Allergen Reactions   Atorvastatin     Other reaction(s): Myalgias (intolerance)   Fluzone  [Influenza Virus Vaccine] Swelling   Metformin Nausea Only   Sulfa Antibiotics Other (See Comments)    unknown   Medications:  Current Outpatient Medications:    Accu-Chek Softclix Lancets lancets, CHECK GLUCOSE 4 TIMES DAILY Dx E11.65, Disp: 400 each, Rfl: 3   aspirin 81 MG tablet, Take 81 mg by mouth every morning. , Disp: , Rfl:    blood glucose meter kit and supplies, Dispense based on patient and insurance preference. Use up to four times daily as directed. (FOR ICD-10 E10.9, E11.9)., Disp: 1 each, Rfl: 0   cephALEXin (KEFLEX) 500 MG capsule, Take 1 capsule (500 mg total) by mouth 4 (four) times daily., Disp: 28 capsule, Rfl: 0   Cholecalciferol 25 MCG (1000 UT) tablet, Take 1,000 Units by mouth every morning. , Disp: , Rfl:    clopidogrel (PLAVIX) 75 MG tablet, Take 75 mg by mouth every morning., Disp: , Rfl:    Coenzyme Q10 (COQ-10) 200 MG CAPS, 200 mg. Take 200 mg by mouth daily., Disp: , Rfl:    dapagliflozin propanediol (FARXIGA) 5 MG TABS tablet, Take 1 tablet (5 mg total) by mouth daily before breakfast., Disp: 90 tablet, Rfl: 1   glimepiride (AMARYL) 2 MG tablet, TAKE 1 TABLET BY MOUTH TWICE DAILY WITH A MEAL, Disp: 180 tablet, Rfl: 0   glucose blood (ACCU-CHEK GUIDE) test strip, CHECK  GLUCOSE 4 TIMES DAILY Dx E11.65, Disp: 400 each, Rfl: 3   metoprolol tartrate (LOPRESSOR) 25 MG tablet, Take 1 tablet (25 mg total) by mouth 2 (two) times daily., Disp: 180 tablet, Rfl: 1   nitroGLYCERIN (NITROSTAT) 0.4 MG SL tablet, Place 1 tablet (0.4 mg total) under the tongue every 5 (five) minutes as needed for chest pain., Disp: 30 tablet, Rfl: 2   rosuvastatin (CRESTOR) 20 MG tablet, Take 20 mg by mouth every morning., Disp: , Rfl:   Observations/Objective: Patient is well-developed, well-nourished in no acute distress.  Resting comfortably  at home.  Head is normocephalic, atraumatic.  No labored breathing.  Aphasia noted, confused. Daughter at bedside to answer questions.  She is up eating breakfast and feeding her self.    Assessment and Plan: 1. Arthritis - CMP14+EGFR  2. Hyperlipidemia associated with type 2 diabetes mellitus ([place]) - CMP14+EGFR  3. Osteopenia, unspecified location - CMP14+EGFR  4. Type 2 diabetes mellitus with other specified complication, without long-term current use of insulin (HCC) - CMP14+EGFR - Bayer DCA Hb A1c Waived  5. Hypertension associated with diabetes ([place]) - CMP14+EGFR  6. Stage 3b chronic kidney disease ([place]) - CMP14+EGFR  7.  Late effect of cerebrovascular accident (CVA) - CMP14+EGFR  8. Hospital discharge follow-up - CMP14+EGFR  Will get Home Health to get labs Continue medications Keep Neurologists follow up Continue PT, OT, and speech   Follow Up Instructions: I discussed the assessment and treatment plan with the patient. The patient was provided an opportunity to ask questions and all were answered. The patient agreed with the plan and demonstrated an understanding of the instructions.  A copy of instructions were sent to the patient via MyChart unless otherwise noted below.     The patient was advised to call back or seek an in-person evaluation if the symptoms worsen or if the condition fails to improve as  anticipated.  Time:  I spent 14 minutes with the patient via telehealth technology discussing the above problems/concerns.    Evelina Dun, FNP

## 2022-05-08 DIAGNOSIS — I10 Essential (primary) hypertension: Secondary | ICD-10-CM | POA: Diagnosis not present

## 2022-05-08 DIAGNOSIS — E1165 Type 2 diabetes mellitus with hyperglycemia: Secondary | ICD-10-CM | POA: Diagnosis not present

## 2022-05-08 DIAGNOSIS — I69351 Hemiplegia and hemiparesis following cerebral infarction affecting right dominant side: Secondary | ICD-10-CM | POA: Diagnosis not present

## 2022-05-14 MED ORDER — MENTHOL-ZINC OXIDE 0.44-20.625 % EX OINT: 1.0000 | TOPICAL_OINTMENT | Freq: Every day | CUTANEOUS | 2 refills | Status: DC

## 2022-06-11 ENCOUNTER — Telehealth: Payer: Self-pay | Admitting: Family

## 2022-06-11 MED ORDER — ROSUVASTATIN CALCIUM 20 MG PO TABS: 20.0000 mg | ORAL_TABLET | Freq: Every morning | ORAL | 1 refills | Status: DC

## 2022-06-11 NOTE — Telephone Encounter (Signed)
Daughter aware and verbalized understanding Crestor was sent

## 2022-06-11 NOTE — Telephone Encounter (Signed)
  Prescription Request  06/11/2022  Is this a "Controlled Substance" medicine? no  Have you seen your PCP in the last 2 weeks? No pt last appt 04/2022  If YES, route message to pool  -  If NO, patient needs to be scheduled for appointment.  What is the name of the medication or equipment? rosuvastatin (CRESTOR) 20 MG tablet   Have you contacted your pharmacy to request a refill? Yes, Pt is out of this medication   Which pharmacy would you like this sent to? [facility]    Patient notified that their request is being sent to the clinical staff for review and that they should receive a response within 2 business days.

## 2022-06-13 DIAGNOSIS — I63322 Cerebral infarction due to thrombosis of left anterior cerebral artery: Secondary | ICD-10-CM | POA: Diagnosis not present

## 2022-06-13 DIAGNOSIS — E785 Hyperlipidemia, unspecified: Secondary | ICD-10-CM | POA: Diagnosis not present

## 2022-06-13 DIAGNOSIS — I5181 Takotsubo syndrome: Secondary | ICD-10-CM | POA: Diagnosis not present

## 2022-06-13 DIAGNOSIS — E1165 Type 2 diabetes mellitus with hyperglycemia: Secondary | ICD-10-CM | POA: Diagnosis not present

## 2022-06-13 DIAGNOSIS — Z950 Presence of cardiac pacemaker: Secondary | ICD-10-CM | POA: Diagnosis not present

## 2022-06-13 DIAGNOSIS — R55 Syncope and collapse: Secondary | ICD-10-CM | POA: Diagnosis not present

## 2022-06-13 DIAGNOSIS — R1310 Dysphagia, unspecified: Secondary | ICD-10-CM | POA: Diagnosis not present

## 2022-07-14 ENCOUNTER — Other Ambulatory Visit: Payer: Self-pay | Admitting: Family

## 2022-07-14 ENCOUNTER — Other Ambulatory Visit: Payer: Self-pay | Admitting: Family Medicine

## 2022-07-14 DIAGNOSIS — R55 Syncope and collapse: Secondary | ICD-10-CM | POA: Diagnosis not present

## 2022-07-14 DIAGNOSIS — E118 Type 2 diabetes mellitus with unspecified complications: Secondary | ICD-10-CM

## 2022-07-14 DIAGNOSIS — Z950 Presence of cardiac pacemaker: Secondary | ICD-10-CM | POA: Diagnosis not present

## 2022-07-14 DIAGNOSIS — E1165 Type 2 diabetes mellitus with hyperglycemia: Secondary | ICD-10-CM | POA: Diagnosis not present

## 2022-07-14 DIAGNOSIS — I63322 Cerebral infarction due to thrombosis of left anterior cerebral artery: Secondary | ICD-10-CM | POA: Diagnosis not present

## 2022-07-14 DIAGNOSIS — R1310 Dysphagia, unspecified: Secondary | ICD-10-CM | POA: Diagnosis not present

## 2022-07-14 DIAGNOSIS — E785 Hyperlipidemia, unspecified: Secondary | ICD-10-CM | POA: Diagnosis not present

## 2022-07-14 DIAGNOSIS — I5181 Takotsubo syndrome: Secondary | ICD-10-CM | POA: Diagnosis not present

## 2022-07-14 MED ORDER — GLIMEPIRIDE 2 MG PO TABS: 2.0000 mg | ORAL_TABLET | Freq: Two times a day (BID) | ORAL | 0 refills | Status: DC

## 2022-07-14 NOTE — Telephone Encounter (Signed)
From: Marcelle Overlie To: Office of [place], [place] Sent: 07/14/2022 9:58 AM EST Subject: Medication Renewal Request  Refills have been requested for the following medications:   glimepiride (AMARYL) 2 MG tablet [[name]]  Preferred pharmacy: [facility] [place], [place] - [address] Delivery method: [employer]

## 2022-07-23 ENCOUNTER — Encounter (INDEPENDENT_AMBULATORY_CARE_PROVIDER_SITE_OTHER): Payer: Medicare PPO

## 2022-07-23 DIAGNOSIS — B3731 Acute candidiasis of vulva and vagina: Secondary | ICD-10-CM

## 2022-07-23 MED ORDER — FLUCONAZOLE 150 MG PO TABS: 150.0000 mg | ORAL_TABLET | ORAL | 0 refills | Status: DC | PRN

## 2022-07-23 MED ORDER — NYSTATIN 100000 UNIT/GM EX POWD: 1.0000 | Freq: Three times a day (TID) | CUTANEOUS | 0 refills | Status: DC

## 2022-07-23 NOTE — Telephone Encounter (Signed)
Hello, I have sent in Diflucan to her pharmacy. I have also sent in some nystatin powder to use as needed since she is using the diaper.     Evelina Dun, FNP  Approximately 5 minutes was spent documenting and reviewing patient's chart.

## 2022-08-03 ENCOUNTER — Telehealth: Payer: Self-pay

## 2022-08-03 NOTE — Telephone Encounter (Signed)
Patient has had hypoglycemic episodes over the weekend.  Her AM fasting blood sugar yesterday as in the 50's so the patient's daughter gave her some orange juice and the patient ate.  Later in the day patient seemed to be acting differently and daughter checked her blood sugar again and it was in the 60's around 5 pm.  This morning her fasting blood sugar was 83.  Daughter is concerned and would like to know if they should make some medications changes.  She is currently taking Glimepiride 2 mg twice a day and Farxiga 5 mg daily.  Please advise.

## 2022-08-03 NOTE — Telephone Encounter (Signed)
Stop Glimepiride. We Can increase Farxiga to 10 mg if glucose increases. Wilder Glade does not cause hypoglycemia.

## 2022-08-03 NOTE — Telephone Encounter (Signed)
Pt's daughter aware of provider feedback and voiced understanding. °

## 2022-08-06 ENCOUNTER — Telehealth: Payer: Self-pay | Admitting: Family

## 2022-08-06 DIAGNOSIS — I6930 Unspecified sequelae of cerebral infarction: Secondary | ICD-10-CM

## 2022-08-11 NOTE — Telephone Encounter (Signed)
Referral to Hospice ordered.

## 2022-08-11 NOTE — Telephone Encounter (Signed)
Daughter has been made aware. Advised to call our office back in one week if she has not heard anything about the referral.

## 2022-08-12 DIAGNOSIS — Z950 Presence of cardiac pacemaker: Secondary | ICD-10-CM | POA: Diagnosis not present

## 2022-08-12 DIAGNOSIS — R55 Syncope and collapse: Secondary | ICD-10-CM | POA: Diagnosis not present

## 2022-08-12 DIAGNOSIS — E785 Hyperlipidemia, unspecified: Secondary | ICD-10-CM | POA: Diagnosis not present

## 2022-08-12 DIAGNOSIS — E1165 Type 2 diabetes mellitus with hyperglycemia: Secondary | ICD-10-CM | POA: Diagnosis not present

## 2022-08-12 DIAGNOSIS — I5181 Takotsubo syndrome: Secondary | ICD-10-CM | POA: Diagnosis not present

## 2022-08-12 DIAGNOSIS — R1310 Dysphagia, unspecified: Secondary | ICD-10-CM | POA: Diagnosis not present

## 2022-08-12 DIAGNOSIS — I63322 Cerebral infarction due to thrombosis of left anterior cerebral artery: Secondary | ICD-10-CM | POA: Diagnosis not present

## 2022-09-08 DIAGNOSIS — Z515 Encounter for palliative care: Secondary | ICD-10-CM | POA: Diagnosis not present

## 2022-09-08 DIAGNOSIS — I69991 Dysphagia following unspecified cerebrovascular disease: Secondary | ICD-10-CM | POA: Diagnosis not present

## 2022-09-08 DIAGNOSIS — Z7409 Other reduced mobility: Secondary | ICD-10-CM | POA: Diagnosis not present

## 2022-09-08 DIAGNOSIS — Z993 Dependence on wheelchair: Secondary | ICD-10-CM | POA: Diagnosis not present

## 2022-09-08 DIAGNOSIS — E1159 Type 2 diabetes mellitus with other circulatory complications: Secondary | ICD-10-CM

## 2022-09-08 MED ORDER — METOPROLOL TARTRATE 25 MG PO TABS: 25.0000 mg | ORAL_TABLET | Freq: Two times a day (BID) | ORAL | 0 refills | Status: DC

## 2022-09-12 DIAGNOSIS — R1310 Dysphagia, unspecified: Secondary | ICD-10-CM | POA: Diagnosis not present

## 2022-09-12 DIAGNOSIS — E785 Hyperlipidemia, unspecified: Secondary | ICD-10-CM | POA: Diagnosis not present

## 2022-09-12 DIAGNOSIS — I5181 Takotsubo syndrome: Secondary | ICD-10-CM | POA: Diagnosis not present

## 2022-09-12 DIAGNOSIS — E1165 Type 2 diabetes mellitus with hyperglycemia: Secondary | ICD-10-CM | POA: Diagnosis not present

## 2022-09-12 DIAGNOSIS — I63322 Cerebral infarction due to thrombosis of left anterior cerebral artery: Secondary | ICD-10-CM | POA: Diagnosis not present

## 2022-09-12 DIAGNOSIS — Z950 Presence of cardiac pacemaker: Secondary | ICD-10-CM | POA: Diagnosis not present

## 2022-09-12 DIAGNOSIS — R55 Syncope and collapse: Secondary | ICD-10-CM | POA: Diagnosis not present

## 2022-09-22 ENCOUNTER — Telehealth: Payer: Self-pay | Admitting: Family

## 2022-09-22 NOTE — Telephone Encounter (Signed)
CALLED DAUGHTER AWARE AND APPT MADE

## 2022-09-22 NOTE — Telephone Encounter (Signed)
Pt needs to be seen. Recommend wearing compression hose, elevated feet,

## 2022-09-22 NOTE — Telephone Encounter (Signed)
Please advise 

## 2022-09-24 ENCOUNTER — Telehealth (INDEPENDENT_AMBULATORY_CARE_PROVIDER_SITE_OTHER): Payer: Medicare PPO | Admitting: Family

## 2022-09-24 ENCOUNTER — Encounter: Payer: Self-pay | Admitting: Family

## 2022-09-24 DIAGNOSIS — R600 Localized edema: Secondary | ICD-10-CM | POA: Diagnosis not present

## 2022-09-24 DIAGNOSIS — N1832 Chronic kidney disease, stage 3b: Secondary | ICD-10-CM

## 2022-09-24 DIAGNOSIS — I6930 Unspecified sequelae of cerebral infarction: Secondary | ICD-10-CM

## 2022-09-24 MED ORDER — HYDROCHLOROTHIAZIDE 12.5 MG PO TABS: 12.5000 mg | ORAL_TABLET | Freq: Every day | ORAL | 0 refills | Status: DC

## 2022-09-24 NOTE — Progress Notes (Signed)
Virtual Visit Consent   Kathryn Henson, you are scheduled for a virtual visit with a [facility] provider today. Just as with appointments in the office, your consent must be obtained to participate. Your consent will be active for this visit and any virtual visit you may have with one of our providers in the next 365 days. If you have a MyChart account, a copy of this consent can be sent to you electronically.  As this is a virtual visit, video technology does not allow for your provider to perform a traditional examination. This may limit your provider's ability to fully assess your condition. If your provider identifies any concerns that need to be evaluated in person or the need to arrange testing (such as labs, EKG, etc.), we will make arrangements to do so. Although advances in technology are sophisticated, we cannot ensure that it will always work on either your end or our end. If the connection with a video visit is poor, the visit may have to be switched to a telephone visit. With either a video or telephone visit, we are not always able to ensure that we have a secure connection.  By engaging in this virtual visit, you consent to the provision of healthcare and authorize for your insurance to be billed (if applicable) for the services provided during this visit. Depending on your insurance coverage, you may receive a charge related to this service.  I need to obtain your verbal consent now. Are you willing to proceed with your visit today? [name] has provided verbal consent on 09/24/2022 for a virtual visit (video or telephone). [name], FNP  Date: 09/24/2022 10:02 AM  Virtual Visit via Video Note   I, [name], connected with  Kathryn Henson  ([id number], March 10, 1923) on 09/24/22 at  9:55 AM EDT by a video-enabled telemedicine application and verified that I am speaking with the correct person using two identifiers.  Location: Patient: Virtual Visit Location Patient:  Home Provider: Virtual Visit Location Provider: Office/Clinic   I discussed the limitations of evaluation and management by telemedicine and the availability of in person appointments. The patient expressed understanding and agreed to proceed.    History of Present Illness: Kathryn Henson is a 89 y.o. who identifies as a female who was assigned female at birth, and is being seen today for edema. She has palliative care and they are doing home visits every 6 weeks.   Pt reports bilateral leg swelling that is worse in the left.   She has hx of CVA with right sided weakness.   She just started wearing compression hose yesterday and this has helped.   She has CKD and avoids NSAID's.   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 , Disp: , Rfl:    blood glucose meter kit and supplies, Dispense based on patient and insurance  preference. Use up to four times daily as directed. (FOR ICD-10 E10.9, E11.9)., Disp: 1 each, Rfl: 0   Cholecalciferol 25 MCG (1000 UT) tablet, Take 1,000 Units by mouth every morning. , Disp: , Rfl:    clopidogrel (PLAVIX) 75 MG tablet, Take 75 mg by mouth every morning., Disp: , Rfl:    Coenzyme Q10 (COQ-10) 200 MG CAPS, 200 mg. Take 200 mg by mouth daily., Disp: , Rfl:    dapagliflozin propanediol (FARXIGA) 5 MG TABS tablet, Take 1 tablet (5 mg total) by mouth daily before breakfast., Disp: 90 tablet, Rfl: 1   glucose blood (ACCU-CHEK GUIDE) test strip, CHECK GLUCOSE 4 TIMES DAILY Dx E11.65, Disp: 400 each, Rfl: 3   Menthol-Zinc Oxide 0.44-20.625 % OINT, Apply 1 Application topically daily., Disp: 113 g, Rfl: 2   metoprolol tartrate (LOPRESSOR) 25 MG tablet, Take 1 tablet (25 mg total) by mouth 2 (two) times daily., Disp: 180 tablet, Rfl: 0   nitroGLYCERIN (NITROSTAT) 0.4 MG SL tablet, Place 1 tablet (0.4 mg total) under the tongue every 5 (five) minutes as needed for chest pain., Disp: 30 tablet, Rfl: 2   nystatin (MYCOSTATIN/NYSTOP) powder, Apply 1 Application topically 3 (three) times daily., Disp: 60 g, Rfl: 0   rosuvastatin (CRESTOR) 20 MG tablet, Take 1 tablet (20 mg total) by mouth every morning., Disp: 90 tablet, Rfl: 1  Observations/Objective: Patient is well-developed, well-nourished in no acute distress.  Resting comfortably  at home.  Head is normocephalic, atraumatic.  No labored breathing.  Speech is clear and coherent with logical content.  Patient is alert and oriented at baseline.  Bilateral feet 2+ edema, no redness or warmtn  Assessment and Plan: 1. Peripheral edema - hydrochlorothiazide (HYDRODIURIL) 12.5 MG tablet; Take 1 tablet (12.5 mg total) by mouth daily.  Dispense: 90 tablet; Refill: 0  2. Late effect of cerebrovascular accident (CVA)  3. Stage 3b chronic kidney disease (HCC)  Start HCTZ 12.5 mg as needed for increase swelling Wear compression hose  daily Low salt diet  Follow up in 3 months  and keep Palliative Care follow up  Follow Up Instructions: I discussed the assessment and treatment plan with the patient. The patient was provided an opportunity to ask questions and all were answered. The patient agreed with the plan and demonstrated an understanding of the instructions.  A copy of instructions were sent to the patient via MyChart unless otherwise noted below.    The patient was advised to call back or seek an in-person evaluation if the symptoms worsen or if the condition fails to improve as anticipated.  Time:  I spent 9 minutes with the patient via telehealth technology discussing the above problems/concerns.    [name], FNP

## 2022-10-12 DIAGNOSIS — I63322 Cerebral infarction due to thrombosis of left anterior cerebral artery: Secondary | ICD-10-CM | POA: Diagnosis not present

## 2022-10-12 DIAGNOSIS — R55 Syncope and collapse: Secondary | ICD-10-CM | POA: Diagnosis not present

## 2022-10-12 DIAGNOSIS — E785 Hyperlipidemia, unspecified: Secondary | ICD-10-CM | POA: Diagnosis not present

## 2022-10-12 DIAGNOSIS — I5181 Takotsubo syndrome: Secondary | ICD-10-CM | POA: Diagnosis not present

## 2022-10-12 DIAGNOSIS — Z950 Presence of cardiac pacemaker: Secondary | ICD-10-CM | POA: Diagnosis not present

## 2022-10-12 DIAGNOSIS — E1165 Type 2 diabetes mellitus with hyperglycemia: Secondary | ICD-10-CM | POA: Diagnosis not present

## 2022-10-12 DIAGNOSIS — R1310 Dysphagia, unspecified: Secondary | ICD-10-CM | POA: Diagnosis not present

## 2022-10-22 DIAGNOSIS — Z515 Encounter for palliative care: Secondary | ICD-10-CM | POA: Diagnosis not present

## 2022-10-22 DIAGNOSIS — I6930 Unspecified sequelae of cerebral infarction: Secondary | ICD-10-CM | POA: Diagnosis not present

## 2022-11-11 DIAGNOSIS — E1122 Type 2 diabetes mellitus with diabetic chronic kidney disease: Secondary | ICD-10-CM | POA: Diagnosis not present

## 2022-11-11 DIAGNOSIS — Z823 Family history of stroke: Secondary | ICD-10-CM | POA: Diagnosis not present

## 2022-11-11 DIAGNOSIS — N182 Chronic kidney disease, stage 2 (mild): Secondary | ICD-10-CM | POA: Diagnosis not present

## 2022-11-11 DIAGNOSIS — Z882 Allergy status to sulfonamides status: Secondary | ICD-10-CM | POA: Diagnosis not present

## 2022-11-11 DIAGNOSIS — R29810 Facial weakness: Secondary | ICD-10-CM | POA: Diagnosis not present

## 2022-11-11 DIAGNOSIS — E785 Hyperlipidemia, unspecified: Secondary | ICD-10-CM | POA: Diagnosis not present

## 2022-11-11 DIAGNOSIS — I129 Hypertensive chronic kidney disease with stage 1 through stage 4 chronic kidney disease, or unspecified chronic kidney disease: Secondary | ICD-10-CM | POA: Diagnosis not present

## 2022-11-11 DIAGNOSIS — G3184 Mild cognitive impairment, so stated: Secondary | ICD-10-CM | POA: Diagnosis not present

## 2022-11-11 DIAGNOSIS — R1310 Dysphagia, unspecified: Secondary | ICD-10-CM | POA: Diagnosis not present

## 2022-11-11 DIAGNOSIS — M810 Age-related osteoporosis without current pathological fracture: Secondary | ICD-10-CM | POA: Diagnosis not present

## 2022-11-11 DIAGNOSIS — I69328 Other speech and language deficits following cerebral infarction: Secondary | ICD-10-CM | POA: Diagnosis not present

## 2022-11-11 DIAGNOSIS — R471 Dysarthria and anarthria: Secondary | ICD-10-CM | POA: Diagnosis not present

## 2022-11-11 DIAGNOSIS — Z809 Family history of malignant neoplasm, unspecified: Secondary | ICD-10-CM | POA: Diagnosis not present

## 2022-11-11 DIAGNOSIS — M1990 Unspecified osteoarthritis, unspecified site: Secondary | ICD-10-CM | POA: Diagnosis not present

## 2022-11-11 DIAGNOSIS — Z7984 Long term (current) use of oral hypoglycemic drugs: Secondary | ICD-10-CM | POA: Diagnosis not present

## 2022-11-11 DIAGNOSIS — R011 Cardiac murmur, unspecified: Secondary | ICD-10-CM | POA: Diagnosis not present

## 2022-11-11 DIAGNOSIS — Z853 Personal history of malignant neoplasm of breast: Secondary | ICD-10-CM | POA: Diagnosis not present

## 2022-11-11 DIAGNOSIS — R32 Unspecified urinary incontinence: Secondary | ICD-10-CM | POA: Diagnosis not present

## 2022-11-12 DIAGNOSIS — I63322 Cerebral infarction due to thrombosis of left anterior cerebral artery: Secondary | ICD-10-CM | POA: Diagnosis not present

## 2022-11-12 DIAGNOSIS — E1165 Type 2 diabetes mellitus with hyperglycemia: Secondary | ICD-10-CM | POA: Diagnosis not present

## 2022-11-12 DIAGNOSIS — I5181 Takotsubo syndrome: Secondary | ICD-10-CM | POA: Diagnosis not present

## 2022-11-12 DIAGNOSIS — E785 Hyperlipidemia, unspecified: Secondary | ICD-10-CM | POA: Diagnosis not present

## 2022-11-12 DIAGNOSIS — R55 Syncope and collapse: Secondary | ICD-10-CM | POA: Diagnosis not present

## 2022-11-12 DIAGNOSIS — Z950 Presence of cardiac pacemaker: Secondary | ICD-10-CM | POA: Diagnosis not present

## 2022-11-12 DIAGNOSIS — R1310 Dysphagia, unspecified: Secondary | ICD-10-CM | POA: Diagnosis not present

## 2022-11-21 ENCOUNTER — Other Ambulatory Visit: Payer: Self-pay | Admitting: Family

## 2022-12-04 DIAGNOSIS — Z515 Encounter for palliative care: Secondary | ICD-10-CM | POA: Diagnosis not present

## 2022-12-04 DIAGNOSIS — I6930 Unspecified sequelae of cerebral infarction: Secondary | ICD-10-CM | POA: Diagnosis not present

## 2022-12-08 ENCOUNTER — Other Ambulatory Visit: Payer: Self-pay | Admitting: Family

## 2022-12-08 DIAGNOSIS — B3731 Acute candidiasis of vulva and vagina: Secondary | ICD-10-CM

## 2022-12-12 DIAGNOSIS — I5181 Takotsubo syndrome: Secondary | ICD-10-CM | POA: Diagnosis not present

## 2022-12-12 DIAGNOSIS — I63322 Cerebral infarction due to thrombosis of left anterior cerebral artery: Secondary | ICD-10-CM | POA: Diagnosis not present

## 2022-12-12 DIAGNOSIS — E1165 Type 2 diabetes mellitus with hyperglycemia: Secondary | ICD-10-CM | POA: Diagnosis not present

## 2022-12-12 DIAGNOSIS — Z950 Presence of cardiac pacemaker: Secondary | ICD-10-CM | POA: Diagnosis not present

## 2022-12-12 DIAGNOSIS — R55 Syncope and collapse: Secondary | ICD-10-CM | POA: Diagnosis not present

## 2022-12-12 DIAGNOSIS — R1310 Dysphagia, unspecified: Secondary | ICD-10-CM | POA: Diagnosis not present

## 2022-12-12 DIAGNOSIS — E785 Hyperlipidemia, unspecified: Secondary | ICD-10-CM | POA: Diagnosis not present

## 2023-01-03 ENCOUNTER — Other Ambulatory Visit: Payer: Self-pay | Admitting: Family

## 2023-01-04 NOTE — Telephone Encounter (Signed)
[facility] pt NTBS 30-d given 12/01/22

## 2023-01-12 DIAGNOSIS — I5181 Takotsubo syndrome: Secondary | ICD-10-CM | POA: Diagnosis not present

## 2023-01-12 DIAGNOSIS — R1310 Dysphagia, unspecified: Secondary | ICD-10-CM | POA: Diagnosis not present

## 2023-01-12 DIAGNOSIS — Z950 Presence of cardiac pacemaker: Secondary | ICD-10-CM | POA: Diagnosis not present

## 2023-01-12 DIAGNOSIS — E785 Hyperlipidemia, unspecified: Secondary | ICD-10-CM | POA: Diagnosis not present

## 2023-01-12 DIAGNOSIS — E1165 Type 2 diabetes mellitus with hyperglycemia: Secondary | ICD-10-CM | POA: Diagnosis not present

## 2023-01-12 DIAGNOSIS — I63322 Cerebral infarction due to thrombosis of left anterior cerebral artery: Secondary | ICD-10-CM | POA: Diagnosis not present

## 2023-01-12 DIAGNOSIS — R55 Syncope and collapse: Secondary | ICD-10-CM | POA: Diagnosis not present

## 2023-01-15 ENCOUNTER — Encounter: Payer: Self-pay | Admitting: Family

## 2023-01-15 ENCOUNTER — Telehealth: Payer: Medicare PPO | Admitting: Family

## 2023-01-15 DIAGNOSIS — E1169 Type 2 diabetes mellitus with other specified complication: Secondary | ICD-10-CM

## 2023-01-15 DIAGNOSIS — E785 Hyperlipidemia, unspecified: Secondary | ICD-10-CM

## 2023-01-15 DIAGNOSIS — N1832 Chronic kidney disease, stage 3b: Secondary | ICD-10-CM | POA: Diagnosis not present

## 2023-01-15 DIAGNOSIS — Z7984 Long term (current) use of oral hypoglycemic drugs: Secondary | ICD-10-CM | POA: Diagnosis not present

## 2023-01-15 DIAGNOSIS — I6930 Unspecified sequelae of cerebral infarction: Secondary | ICD-10-CM

## 2023-01-15 DIAGNOSIS — I152 Hypertension secondary to endocrine disorders: Secondary | ICD-10-CM

## 2023-01-15 DIAGNOSIS — E1165 Type 2 diabetes mellitus with hyperglycemia: Secondary | ICD-10-CM

## 2023-01-15 DIAGNOSIS — E1159 Type 2 diabetes mellitus with other circulatory complications: Secondary | ICD-10-CM | POA: Diagnosis not present

## 2023-01-15 MED ORDER — DAPAGLIFLOZIN PROPANEDIOL 5 MG PO TABS
5.0000 mg | ORAL_TABLET | Freq: Every day | ORAL | 1 refills | Status: AC
Start: 2023-01-15 — End: ?

## 2023-01-15 MED ORDER — METOPROLOL TARTRATE 25 MG PO TABS
25.0000 mg | ORAL_TABLET | Freq: Two times a day (BID) | ORAL | 1 refills | Status: AC
Start: 2023-01-15 — End: ?

## 2023-01-15 MED ORDER — ROSUVASTATIN CALCIUM 20 MG PO TABS
20.0000 mg | ORAL_TABLET | Freq: Every morning | ORAL | 2 refills | Status: AC
Start: 2023-01-15 — End: ?

## 2023-01-15 NOTE — Progress Notes (Signed)
Virtual Visit Consent   Kathryn Henson, you are scheduled for a virtual visit with a [facility] provider today. Just as with appointments in the office, your consent must be obtained to participate. Your consent will be active for this visit and any virtual visit you may have with one of our providers in the next 365 days. If you have a MyChart account, a copy of this consent can be sent to you electronically.  As this is a virtual visit, video technology does not allow for your provider to perform a traditional examination. This may limit your provider's ability to fully assess your condition. If your provider identifies any concerns that need to be evaluated in person or the need to arrange testing (such as labs, EKG, etc.), we will make arrangements to do so. Although advances in technology are sophisticated, we cannot ensure that it will always work on either your end or our end. If the connection with a video visit is poor, the visit may have to be switched to a telephone visit. With either a video or telephone visit, we are not always able to ensure that we have a secure connection.  By engaging in this virtual visit, you consent to the provision of healthcare and authorize for your insurance to be billed (if applicable) for the services provided during this visit. Depending on your insurance coverage, you may receive a charge related to this service.  I need to obtain your verbal consent now. Are you willing to proceed with your visit today? Kathryn Henson has provided verbal consent on 01/15/2023 for a virtual visit (video or telephone). Kathryn Rodney, FNP  Date: 01/15/2023 10:46 AM  Virtual Visit via Video Note   I, Kathryn Henson, connected with  Kathryn Henson  ([id number], [id number]) on 01/15/23 at 11:10 AM EDT by a video-enabled telemedicine application and verified that I am speaking with the correct person using two identifiers.  Location: Patient: Virtual Visit Location Patient:  Home Provider: Virtual Visit Location Provider: Home Office   I discussed the limitations of evaluation and management by telemedicine and the availability of in person appointments. The patient expressed understanding and agreed to proceed.    History of Present Illness: Kathryn Henson is a 89 y.o. who identifies as a female who was assigned female at birth, and is being seen today for chronic follow up. She is currently on Palliative Care every 4-6 weeks.   She has hx CVA and seizures. She has right sided weakness from CVA.   HPI: Diabetes She presents for her follow-up diabetic visit. She has type 2 diabetes mellitus. Pertinent negatives for diabetes include no blurred vision and no foot paresthesias. Risk factors for coronary artery disease include dyslipidemia, diabetes mellitus, hypertension, sedentary lifestyle and post-menopausal. She is following a generally healthy diet. Her overall blood glucose range is 110-130 mg/dl.  Hypertension This is a chronic problem. The current episode started more than 1 year ago. The problem has been resolved since onset. The problem is controlled. Pertinent negatives include no blurred vision, malaise/fatigue, peripheral edema or shortness of breath. Risk factors for coronary artery disease include dyslipidemia, diabetes mellitus and sedentary lifestyle. The current treatment provides moderate improvement.  Hyperlipidemia This is a chronic problem. The current episode started more than 1 year ago. Pertinent negatives include no shortness of breath. Current antihyperlipidemic treatment includes statins. The current treatment provides moderate improvement of lipids. Risk factors for coronary artery disease include dyslipidemia, hypertension and a sedentary lifestyle.  Arthritis Presents for follow-up visit. She complains of pain and stiffness. Her pain is at a severity of 0/10.    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 , Disp: , Rfl:    blood glucose meter kit and supplies, Dispense based on patient and insurance preference. Use up to four times daily as directed. (FOR ICD-10 E10.9, E11.9)., Disp: 1 each, Rfl: 0   CALMOSEPTINE 0.44-20.6 % OINT, APPLY 1 APPLICATION TOPICALLY DAILY, Disp: 113 g, Rfl: 0   Cholecalciferol 25 MCG (1000 UT) tablet, Take 1,000 Units by mouth every morning. , Disp: , Rfl:    Coenzyme Q10 (COQ-10) 200 MG CAPS, 200 mg. Take 200 mg by mouth daily., Disp: , Rfl:    dapagliflozin propanediol (FARXIGA) 5 MG TABS tablet, Take 1 tablet (5 mg total) by mouth daily before breakfast., Disp: 90 tablet, Rfl: 1   glucose blood (ACCU-CHEK GUIDE) test strip, CHECK GLUCOSE 4 TIMES DAILY Dx E11.65, Disp: 400 each, Rfl: 3   metoprolol tartrate (LOPRESSOR) 25 MG tablet, Take 1 tablet (25 mg total) by mouth 2 (two) times daily., Disp: 180 tablet, Rfl: 1   nitroGLYCERIN (NITROSTAT) 0.4 MG SL tablet, Place 1 tablet (0.4 mg total) under the tongue every 5 (five) minutes as needed for chest pain., Disp: 30  tablet, Rfl: 2   rosuvastatin (CRESTOR) 20 MG tablet, Take 1 tablet (20 mg total) by mouth every morning. (NEEDS TO BE SEEN BEFORE NEXT REFILL), Disp: 90 tablet, Rfl: 2  Observations/Objective: Patient is well-developed, well-nourished in no acute distress.  Resting comfortably at home.  Head is normocephalic, atraumatic.  No labored breathing.  Speech is clear and coherent with logical content.  Patient is alert and oriented at baseline.  Right sided weakness, up eating breakfast. Doing well  Assessment and Plan: 1. Hypertension associated with diabetes (HCC) - metoprolol tartrate (LOPRESSOR) 25 MG tablet; Take 1 tablet (25 mg total) by mouth 2 (two) times daily.  Dispense: 180 tablet; Refill: 1 - dapagliflozin propanediol (FARXIGA) 5 MG TABS tablet; Take 1 tablet (5 mg total) by mouth daily before breakfast.  Dispense: 90 tablet; Refill: 1  2. Late effect of cerebrovascular accident (CVA) - dapagliflozin propanediol (FARXIGA) 5 MG TABS tablet; Take 1 tablet (5 mg total) by mouth daily before breakfast.  Dispense: 90 tablet; Refill: 1  3. Uncontrolled type 2 diabetes mellitus with hyperglycemia (HCC)  4. Stage 3b chronic kidney disease (HCC) - dapagliflozin propanediol (FARXIGA) 5 MG TABS tablet; Take 1 tablet (5 mg total) by mouth daily before  breakfast.  Dispense: 90 tablet; Refill: 1  5. Type 2 diabetes mellitus with other specified complication, without long-term current use of insulin (HCC) - dapagliflozin propanediol (FARXIGA) 5 MG TABS tablet; Take 1 tablet (5 mg total) by mouth daily before breakfast.  Dispense: 90 tablet; Refill: 1  6. Hyperlipidemia associated with type 2 diabetes mellitus (HCC) - rosuvastatin (CRESTOR) 20 MG tablet; Take 1 tablet (20 mg total) by mouth every morning. (NEEDS TO BE SEEN BEFORE NEXT REFILL)  Dispense: 90 tablet; Refill: 2  Continue current medications  Continue with Palliative care Health diet  Fall preventions  Follow up in 6 months    Follow Up Instructions: I discussed the assessment and treatment plan with the patient. The patient was provided an opportunity to ask questions and all were answered. The patient agreed with the plan and demonstrated an understanding of the instructions.  A copy of instructions were sent to the patient via MyChart unless otherwise noted below.    The patient was advised to call back or seek an in-person evaluation if the symptoms worsen or if the condition fails to improve as anticipated.  Time:  I spent 17 minutes with the patient via telehealth technology discussing the above problems/concerns.    Kathryn Rodney, FNP

## 2023-01-15 NOTE — Patient Instructions (Signed)
Health Maintenance After Age 87 After age 87, you are at a higher risk for certain long-term diseases and infections as well as injuries from falls. Falls are a major cause of broken bones and head injuries in people who are older than age 87. Getting regular preventive care can help to keep you healthy and well. Preventive care includes getting regular testing and making lifestyle changes as recommended by your health care provider. Talk with your health care provider about: Which screenings and tests you should have. A screening is a test that checks for a disease when you have no symptoms. A diet and exercise plan that is right for you. What should I know about screenings and tests to prevent falls? Screening and testing are the best ways to find a health problem early. Early diagnosis and treatment give you the best chance of managing medical conditions that are common after age 87. Certain conditions and lifestyle choices may make you more likely to have a fall. Your health care provider may recommend: Regular vision checks. Poor vision and conditions such as cataracts can make you more likely to have a fall. If you wear glasses, make sure to get your prescription updated if your vision changes. Medicine review. Work with your health care provider to regularly review all of the medicines you are taking, including over-the-counter medicines. Ask your health care provider about any side effects that may make you more likely to have a fall. Tell your health care provider if any medicines that you take make you feel dizzy or sleepy. Strength and balance checks. Your health care provider may recommend certain tests to check your strength and balance while standing, walking, or changing positions. Foot health exam. Foot pain and numbness, as well as not wearing proper footwear, can make you more likely to have a fall. Screenings, including: Osteoporosis screening. Osteoporosis is a condition that causes  the bones to get weaker and break more easily. Blood pressure screening. Blood pressure changes and medicines to control blood pressure can make you feel dizzy. Depression screening. You may be more likely to have a fall if you have a fear of falling, feel depressed, or feel unable to do activities that you used to do. Alcohol use screening. Using too much alcohol can affect your balance and may make you more likely to have a fall. Follow these instructions at home: Lifestyle Do not drink alcohol if: Your health care provider tells you not to drink. If you drink alcohol: Limit how much you have to: 0-1 drink a day for women. 0-2 drinks a day for men. Know how much alcohol is in your drink. In the [place] one drink equals one 12 oz bottle of beer (355 mL), one 5 oz glass of wine (148 mL), or one 1 oz glass of hard liquor (44 mL). Do not use any products that contain nicotine or tobacco. These products include cigarettes, chewing tobacco, and vaping devices, such as e-cigarettes. If you need help quitting, ask your health care provider. Activity  Follow a regular exercise program to stay fit. This will help you maintain your balance. Ask your health care provider what types of exercise are appropriate for you. If you need a cane or walker, use it as recommended by your health care provider. Wear supportive shoes that have nonskid soles. Safety  Remove any tripping hazards, such as rugs, cords, and clutter. Install safety equipment such as grab bars in bathrooms and safety rails on stairs. Keep rooms and walkways   well-lit. General instructions Talk with your health care provider about your risks for falling. Tell your health care provider if: You fall. Be sure to tell your health care provider about all falls, even ones that seem minor. You feel dizzy, tiredness (fatigue), or off-balance. Take over-the-counter and prescription medicines only as told by your health care provider. These include  supplements. Eat a healthy diet and maintain a healthy weight. A healthy diet includes low-fat dairy products, low-fat (lean) meats, and fiber from whole grains, beans, and lots of fruits and vegetables. Stay current with your vaccines. Schedule regular health, dental, and eye exams. Summary Having a healthy lifestyle and getting preventive care can help to protect your health and wellness after age 87. Screening and testing are the best way to find a health problem early and help you avoid having a fall. Early diagnosis and treatment give you the best chance for managing medical conditions that are more common for people who are older than age 87. Falls are a major cause of broken bones and head injuries in people who are older than age 87. Take precautions to prevent a fall at home. Work with your health care provider to learn what changes you can make to improve your health and wellness and to prevent falls. This information is not intended to replace advice given to you by your health care provider. Make sure you discuss any questions you have with your health care provider. Document Revised: 09/30/2020 Document Reviewed: 09/30/2020 Elsevier Patient Education  2024 [employer]  

## 2023-01-21 DIAGNOSIS — Z8673 Personal history of transient ischemic attack (TIA), and cerebral infarction without residual deficits: Secondary | ICD-10-CM | POA: Diagnosis not present

## 2023-01-21 DIAGNOSIS — G4089 Other seizures: Secondary | ICD-10-CM | POA: Diagnosis not present

## 2023-01-21 DIAGNOSIS — Z515 Encounter for palliative care: Secondary | ICD-10-CM | POA: Diagnosis not present

## 2023-01-21 DIAGNOSIS — I6930 Unspecified sequelae of cerebral infarction: Secondary | ICD-10-CM | POA: Diagnosis not present

## 2023-02-12 DIAGNOSIS — Z950 Presence of cardiac pacemaker: Secondary | ICD-10-CM | POA: Diagnosis not present

## 2023-02-12 DIAGNOSIS — I63322 Cerebral infarction due to thrombosis of left anterior cerebral artery: Secondary | ICD-10-CM | POA: Diagnosis not present

## 2023-02-12 DIAGNOSIS — E785 Hyperlipidemia, unspecified: Secondary | ICD-10-CM | POA: Diagnosis not present

## 2023-02-12 DIAGNOSIS — I5181 Takotsubo syndrome: Secondary | ICD-10-CM | POA: Diagnosis not present

## 2023-02-12 DIAGNOSIS — E1165 Type 2 diabetes mellitus with hyperglycemia: Secondary | ICD-10-CM | POA: Diagnosis not present

## 2023-02-12 DIAGNOSIS — R55 Syncope and collapse: Secondary | ICD-10-CM | POA: Diagnosis not present

## 2023-02-12 DIAGNOSIS — R1310 Dysphagia, unspecified: Secondary | ICD-10-CM | POA: Diagnosis not present

## 2023-03-07 ENCOUNTER — Other Ambulatory Visit: Payer: Self-pay | Admitting: Family

## 2023-03-07 DIAGNOSIS — E118 Type 2 diabetes mellitus with unspecified complications: Secondary | ICD-10-CM

## 2023-03-08 NOTE — Telephone Encounter (Signed)
Per pharmacy this was discontinued in March. Not on current med list. Should patient still be taking?

## 2023-03-09 NOTE — Telephone Encounter (Signed)
Lmtcb.

## 2023-03-10 DIAGNOSIS — I6910 Unspecified sequelae of nontraumatic intracerebral hemorrhage: Secondary | ICD-10-CM | POA: Diagnosis not present

## 2023-03-10 DIAGNOSIS — Z515 Encounter for palliative care: Secondary | ICD-10-CM | POA: Diagnosis not present

## 2023-03-14 ENCOUNTER — Other Ambulatory Visit: Payer: Self-pay | Admitting: Family

## 2023-03-14 DIAGNOSIS — R1310 Dysphagia, unspecified: Secondary | ICD-10-CM | POA: Diagnosis not present

## 2023-03-14 DIAGNOSIS — I63322 Cerebral infarction due to thrombosis of left anterior cerebral artery: Secondary | ICD-10-CM | POA: Diagnosis not present

## 2023-03-14 DIAGNOSIS — E785 Hyperlipidemia, unspecified: Secondary | ICD-10-CM | POA: Diagnosis not present

## 2023-03-14 DIAGNOSIS — Z950 Presence of cardiac pacemaker: Secondary | ICD-10-CM | POA: Diagnosis not present

## 2023-03-14 DIAGNOSIS — E1165 Type 2 diabetes mellitus with hyperglycemia: Secondary | ICD-10-CM | POA: Diagnosis not present

## 2023-03-14 DIAGNOSIS — I5181 Takotsubo syndrome: Secondary | ICD-10-CM | POA: Diagnosis not present

## 2023-03-14 DIAGNOSIS — R55 Syncope and collapse: Secondary | ICD-10-CM | POA: Diagnosis not present

## 2023-03-16 ENCOUNTER — Other Ambulatory Visit: Payer: Self-pay | Admitting: *Deleted

## 2023-03-16 DIAGNOSIS — E1165 Type 2 diabetes mellitus with hyperglycemia: Secondary | ICD-10-CM

## 2023-03-16 MED ORDER — ACCU-CHEK SOFTCLIX LANCETS MISC: 3 refills | Status: AC

## 2023-03-16 MED ORDER — BLOOD GLUCOSE METER KIT: PACK | 0 refills | Status: AC

## 2023-03-16 MED ORDER — ACCU-CHEK GUIDE VI STRP: ORAL_STRIP | 3 refills | Status: AC

## 2023-03-17 ENCOUNTER — Telehealth: Payer: Self-pay | Admitting: Family

## 2023-03-17 MED ORDER — ACCU-CHEK GUIDE ME W/DEVICE KIT: PACK | 0 refills | Status: AC

## 2023-03-17 NOTE — Telephone Encounter (Signed)
Glucose machine sent to pharmacy

## 2023-04-14 DIAGNOSIS — I63322 Cerebral infarction due to thrombosis of left anterior cerebral artery: Secondary | ICD-10-CM | POA: Diagnosis not present

## 2023-04-14 DIAGNOSIS — R55 Syncope and collapse: Secondary | ICD-10-CM | POA: Diagnosis not present

## 2023-04-14 DIAGNOSIS — E785 Hyperlipidemia, unspecified: Secondary | ICD-10-CM | POA: Diagnosis not present

## 2023-04-14 DIAGNOSIS — I5181 Takotsubo syndrome: Secondary | ICD-10-CM | POA: Diagnosis not present

## 2023-04-14 DIAGNOSIS — E1165 Type 2 diabetes mellitus with hyperglycemia: Secondary | ICD-10-CM | POA: Diagnosis not present

## 2023-04-14 DIAGNOSIS — R1310 Dysphagia, unspecified: Secondary | ICD-10-CM | POA: Diagnosis not present

## 2023-04-14 DIAGNOSIS — Z950 Presence of cardiac pacemaker: Secondary | ICD-10-CM | POA: Diagnosis not present

## 2023-04-15 DIAGNOSIS — I6910 Unspecified sequelae of nontraumatic intracerebral hemorrhage: Secondary | ICD-10-CM | POA: Diagnosis not present

## 2023-04-15 DIAGNOSIS — Z515 Encounter for palliative care: Secondary | ICD-10-CM | POA: Diagnosis not present

## 2023-04-21 ENCOUNTER — Ambulatory Visit: Payer: Medicare PPO | Admitting: Family Medicine

## 2023-05-20 ENCOUNTER — Ambulatory Visit: Payer: Medicare PPO | Admitting: Family

## 2023-06-30 ENCOUNTER — Telehealth: Payer: Self-pay

## 2023-06-30 NOTE — Telephone Encounter (Signed)
 Copied from CRM [id number]. Topic: General - Other >> Jun 30, 2023  4:25 PM Kathryn Henson wrote: Reason for CRM: Prior authorization needed for dapagliflozin  propanediol (FARXIGA ) 5 MG TABS tablet for [name]

## 2023-07-01 ENCOUNTER — Other Ambulatory Visit (HOSPITAL_COMMUNITY): Payer: Self-pay

## 2023-07-01 ENCOUNTER — Telehealth: Payer: Self-pay

## 2023-07-01 NOTE — Telephone Encounter (Signed)
 Per test claim: Refill too soon. PA is not needed at this time. Medication was filled 06/30/23. Next eligible fill date is 07/25/23.

## 2023-07-01 NOTE — Telephone Encounter (Signed)
 Pharmacy Patient Advocate Encounter   Received notification from Pt Calls Messages that prior authorization for FARXIGA  5MG  is required/requested.   Insurance verification completed.   The patient is insured through [place] .   Per test claim: Refill too soon. PA is not needed at this time. Medication was filled 06/30/23. Next eligible fill date is 07/25/23.

## 2023-07-21 NOTE — Telephone Encounter (Signed)
 I don't see any medications on her med list for seizures. Please advise.

## 2023-07-21 NOTE — Telephone Encounter (Unsigned)
 Copied from CRM [id number]. Topic: Clinical - Medication Question >> Jul 21, 2023 11:48 AM Gildardo Pounds wrote: Reason for CRM: Gaylan Gerold, hospice nurse with Kathryn Henson, patient had another seizure this weekend which makes the 3rd seizure this month. Patient has previously been on seizure medication but patient does not have any medication. Please prescribe seizure medication for the patient. Patient also has issues with hemorrhoids. Needs orders for preparation H and colace. Callback number is [phone]

## 2023-07-22 NOTE — Telephone Encounter (Signed)
 I do not see Keppra on her list. Is this something she took in the past? Please make her an appointment to discuss this.

## 2023-07-22 NOTE — Telephone Encounter (Signed)
 They are aware not in her chart and we can not prescribe

## 2023-07-22 NOTE — Telephone Encounter (Signed)
 Gaylan Gerold calling in again about request for seizure medication. Lyla Son states patient was on keppra, unsure of dosage or how often she was supposed to take it.   Requesting a call back, [phone]
